# Patient Record
Sex: Female | Born: 1950
Health system: Southern US, Community
[De-identification: ages and names within clinical notes are randomized; demographics above are authoritative.]

## PROBLEM LIST (undated history)

## (undated) DIAGNOSIS — K8020 Calculus of gallbladder without cholecystitis without obstruction: Secondary | ICD-10-CM

## (undated) DIAGNOSIS — B029 Zoster without complications: Secondary | ICD-10-CM

## (undated) HISTORY — PX: CHOLECYSTECTOMY: SHX55

---

## 1998-07-16 ENCOUNTER — Other Ambulatory Visit: Admission: RE | Admit: 1998-07-16 | Discharge: 1998-07-16 | Payer: Self-pay | Admitting: Obstetrics and Gynecology

## 1999-02-21 ENCOUNTER — Encounter: Payer: Self-pay | Admitting: Emergency Medicine

## 1999-02-21 ENCOUNTER — Emergency Department (HOSPITAL_COMMUNITY): Admission: EM | Admit: 1999-02-21 | Discharge: 1999-02-21 | Payer: Self-pay | Admitting: Emergency Medicine

## 1999-02-25 ENCOUNTER — Emergency Department (HOSPITAL_COMMUNITY): Admission: EM | Admit: 1999-02-25 | Discharge: 1999-02-25 | Payer: Self-pay | Admitting: Emergency Medicine

## 1999-03-01 ENCOUNTER — Emergency Department (HOSPITAL_COMMUNITY): Admission: EM | Admit: 1999-03-01 | Discharge: 1999-03-01 | Payer: Self-pay | Admitting: Emergency Medicine

## 1999-03-01 ENCOUNTER — Encounter: Payer: Self-pay | Admitting: Emergency Medicine

## 1999-04-28 ENCOUNTER — Ambulatory Visit (HOSPITAL_COMMUNITY): Admission: RE | Admit: 1999-04-28 | Discharge: 1999-04-28 | Payer: Self-pay | Admitting: Internal Medicine

## 2000-04-10 ENCOUNTER — Ambulatory Visit (HOSPITAL_COMMUNITY): Admission: RE | Admit: 2000-04-10 | Discharge: 2000-04-10 | Payer: Self-pay | Admitting: *Deleted

## 2001-01-03 ENCOUNTER — Encounter: Payer: Self-pay | Admitting: Family Medicine

## 2001-01-03 ENCOUNTER — Ambulatory Visit (HOSPITAL_COMMUNITY): Admission: RE | Admit: 2001-01-03 | Discharge: 2001-01-03 | Payer: Self-pay | Admitting: Family Medicine

## 2001-01-14 ENCOUNTER — Ambulatory Visit (HOSPITAL_COMMUNITY): Admission: RE | Admit: 2001-01-14 | Discharge: 2001-01-14 | Payer: Self-pay | Admitting: Family Medicine

## 2001-01-18 ENCOUNTER — Encounter: Payer: Self-pay | Admitting: Emergency Medicine

## 2001-01-18 ENCOUNTER — Emergency Department (HOSPITAL_COMMUNITY): Admission: EM | Admit: 2001-01-18 | Discharge: 2001-01-18 | Payer: Self-pay | Admitting: Emergency Medicine

## 2001-02-12 ENCOUNTER — Encounter: Payer: Self-pay | Admitting: Family Medicine

## 2001-02-12 ENCOUNTER — Ambulatory Visit (HOSPITAL_COMMUNITY): Admission: RE | Admit: 2001-02-12 | Discharge: 2001-02-12 | Payer: Self-pay | Admitting: Family Medicine

## 2002-03-05 ENCOUNTER — Ambulatory Visit (HOSPITAL_COMMUNITY): Admission: RE | Admit: 2002-03-05 | Discharge: 2002-03-05 | Payer: Self-pay | Admitting: Family Medicine

## 2002-03-07 ENCOUNTER — Encounter: Admission: RE | Admit: 2002-03-07 | Discharge: 2002-03-07 | Payer: Self-pay | Admitting: Family Medicine

## 2002-03-07 ENCOUNTER — Encounter: Payer: Self-pay | Admitting: Family Medicine

## 2002-09-16 ENCOUNTER — Encounter: Admission: RE | Admit: 2002-09-16 | Discharge: 2002-09-16 | Payer: Self-pay | Admitting: Internal Medicine

## 2002-09-16 ENCOUNTER — Encounter: Payer: Self-pay | Admitting: Internal Medicine

## 2003-03-31 ENCOUNTER — Emergency Department (HOSPITAL_COMMUNITY): Admission: AD | Admit: 2003-03-31 | Discharge: 2003-04-01 | Payer: Self-pay | Admitting: Emergency Medicine

## 2003-11-16 ENCOUNTER — Ambulatory Visit: Payer: Self-pay | Admitting: *Deleted

## 2003-12-30 ENCOUNTER — Ambulatory Visit: Payer: Self-pay | Admitting: Nurse Practitioner

## 2004-01-13 ENCOUNTER — Ambulatory Visit (HOSPITAL_COMMUNITY): Admission: RE | Admit: 2004-01-13 | Discharge: 2004-01-13 | Payer: Self-pay | Admitting: Family Medicine

## 2009-11-05 ENCOUNTER — Ambulatory Visit: Payer: Self-pay | Admitting: Internal Medicine

## 2011-04-06 ENCOUNTER — Encounter (HOSPITAL_COMMUNITY): Payer: Self-pay | Admitting: *Deleted

## 2011-04-06 ENCOUNTER — Emergency Department (HOSPITAL_COMMUNITY)
Admission: EM | Admit: 2011-04-06 | Discharge: 2011-04-07 | Disposition: A | Discharge status: Home / Self Care | Payer: Self-pay | Attending: Emergency Medicine | Admitting: Emergency Medicine

## 2011-04-06 DIAGNOSIS — R109 Unspecified abdominal pain: Secondary | ICD-10-CM | POA: Insufficient documentation

## 2011-04-06 DIAGNOSIS — R21 Rash and other nonspecific skin eruption: Secondary | ICD-10-CM | POA: Insufficient documentation

## 2011-04-06 DIAGNOSIS — B029 Zoster without complications: Secondary | ICD-10-CM | POA: Insufficient documentation

## 2011-04-06 HISTORY — DX: Calculus of gallbladder without cholecystitis without obstruction: K80.20

## 2011-04-06 HISTORY — DX: Zoster without complications: B02.9

## 2011-04-06 NOTE — ED Notes (Addendum)
C/o R sided abd/back pain, also rash in same area, onset yesterday. (denies: itching, vd, fever). rash red linear blisters, some open. Some nausea. H/o shingles.

## 2011-04-07 MED ORDER — PREDNISONE 10 MG PO TABS: ORAL_TABLET | ORAL | Status: DC

## 2011-04-07 MED ORDER — HYDROCODONE-ACETAMINOPHEN 5-325 MG PO TABS: 2.0000 | ORAL_TABLET | ORAL | Status: AC | PRN

## 2011-04-07 MED ORDER — HYDROCODONE-ACETAMINOPHEN 5-325 MG PO TABS
1.0000 | ORAL_TABLET | Freq: Once | ORAL | Status: AC
  Administered 2011-04-07: 1 via ORAL
  Filled 2011-04-07: qty 1

## 2011-04-07 MED ORDER — ACYCLOVIR 400 MG PO TABS: 400.0000 mg | ORAL_TABLET | Freq: Four times a day (QID) | ORAL | Status: AC

## 2011-04-07 NOTE — ED Provider Notes (Signed)
Medical screening examination/treatment/procedure(s) were performed by non-physician practitioner and as supervising physician I was immediately available for consultation/collaboration.  Alberto Pina P Navia Lindahl, MD 04/07/11 0659 

## 2011-04-07 NOTE — ED Provider Notes (Signed)
History     CSN: [id number]  Arrival date & time 04/06/11  2226   First MD Initiated Contact with Patient 04/06/11 2353      Chief Complaint  Patient presents with  . Herpes Zoster  . Flank Pain  . Rash     HPI  History provided by the patient. Patient is a 61 year old female with past history of shingles who presents with complaints of burning pain and rash to her right back flank and abdomen. Patient reports symptoms first began with pain that felt like chin her back 4 days ago. 2 days ago she began developing a red blistering rash on the same area with increased pain into her abdomen. She reports symptoms are similar to prior history of shingles many years ago. She denies having any recent illnesses. She denies any fever, chills, sweats, nausea or vomiting. Patient has not taken any medications for her symptoms. She denies any aggravating or alleviating factors. She has no other complaints.    Past Medical History  Diagnosis Date  . Shingles   . Gallstones     Past Surgical History  Procedure Date  . Cholecystectomy     History reviewed. No pertinent family history.  History  Substance Use Topics  . Smoking status: Never Smoker   . Smokeless tobacco: Not on file  . Alcohol Use: No    OB History    Grav Para Term Preterm Abortions TAB SAB Ect Mult Living                  Review of Systems  Constitutional: Negative for fever and chills.  Respiratory: Negative for cough and shortness of breath.   Cardiovascular: Negative for chest pain.  Gastrointestinal: Negative for nausea and vomiting.  Skin: Positive for rash.  All other systems reviewed and are negative.    Allergies  Codeine and Erythromycin  Home Medications   Current Outpatient Rx  Name Route Sig Dispense Refill  . IBUPROFEN 200 MG PO TABS Oral Take 400 mg by mouth every 6 (six) hours as needed. For pain    . THERA M PLUS PO TABS Oral Take 1 tablet by mouth daily.    Marland Kitchen OVER THE COUNTER  MEDICATION Oral Take 2 tablets by mouth every morning. OTC allergy and sinus tablets      BP 176/89  Pulse 89  Temp(Src) 98.5 F (36.9 C) (Oral)  Resp 14  SpO2 96%  Physical Exam  Nursing note and vitals reviewed. Constitutional: She is oriented to person, place, and time. She appears well-developed and well-nourished. No distress.  HENT:  Head: Normocephalic and atraumatic.  Cardiovascular: Normal rate and regular rhythm.   Pulmonary/Chest: Effort normal and breath sounds normal. No respiratory distress. She has no wheezes. She has no rales.  Neurological: She is alert and oriented to person, place, and time.  Skin: Skin is warm and dry. No rash noted.       Herpetic rash with erythema limited to the dermatome on the right lower back flank and abdomen area. Rash does not cross the midline anteriorly or posteriorly.  Psychiatric: She has a normal mood and affect. Her behavior is normal.    ED Course  Procedures     1. Shingles       MDM  12:00 AM patient seen and evaluated. Patient in no acute distress.        Angus Seller, [place] 04/07/11 [phone]

## 2012-05-04 ENCOUNTER — Encounter (HOSPITAL_COMMUNITY): Payer: Self-pay | Admitting: *Deleted

## 2012-05-04 ENCOUNTER — Emergency Department (HOSPITAL_COMMUNITY): Payer: Self-pay

## 2012-05-04 ENCOUNTER — Emergency Department (HOSPITAL_COMMUNITY)
Admission: EM | Admit: 2012-05-04 | Discharge: 2012-05-05 | Disposition: A | Discharge status: Home / Self Care | Payer: Self-pay | Attending: Emergency Medicine | Admitting: Emergency Medicine

## 2012-05-04 DIAGNOSIS — Y929 Unspecified place or not applicable: Secondary | ICD-10-CM | POA: Insufficient documentation

## 2012-05-04 DIAGNOSIS — Z23 Encounter for immunization: Secondary | ICD-10-CM | POA: Insufficient documentation

## 2012-05-04 DIAGNOSIS — Y9389 Activity, other specified: Secondary | ICD-10-CM | POA: Insufficient documentation

## 2012-05-04 DIAGNOSIS — W268XXA Contact with other sharp object(s), not elsewhere classified, initial encounter: Secondary | ICD-10-CM | POA: Insufficient documentation

## 2012-05-04 DIAGNOSIS — S61409A Unspecified open wound of unspecified hand, initial encounter: Secondary | ICD-10-CM | POA: Insufficient documentation

## 2012-05-04 DIAGNOSIS — S6990XA Unspecified injury of unspecified wrist, hand and finger(s), initial encounter: Secondary | ICD-10-CM | POA: Insufficient documentation

## 2012-05-04 DIAGNOSIS — Z8719 Personal history of other diseases of the digestive system: Secondary | ICD-10-CM | POA: Insufficient documentation

## 2012-05-04 DIAGNOSIS — S61412A Laceration without foreign body of left hand, initial encounter: Secondary | ICD-10-CM

## 2012-05-04 DIAGNOSIS — Z8619 Personal history of other infectious and parasitic diseases: Secondary | ICD-10-CM | POA: Insufficient documentation

## 2012-05-04 DIAGNOSIS — S6992XA Unspecified injury of left wrist, hand and finger(s), initial encounter: Secondary | ICD-10-CM

## 2012-05-04 MED ORDER — TETANUS-DIPHTH-ACELL PERTUSSIS 5-2.5-18.5 LF-MCG/0.5 IM SUSP
0.5000 mL | Freq: Once | INTRAMUSCULAR | Status: AC
  Administered 2012-05-04: 0.5 mL via INTRAMUSCULAR
  Filled 2012-05-04: qty 0.5

## 2012-05-04 MED ORDER — OXYCODONE-ACETAMINOPHEN 5-325 MG PO TABS
2.0000 | ORAL_TABLET | Freq: Once | ORAL | Status: AC
  Administered 2012-05-04: 2 via ORAL
  Filled 2012-05-04: qty 2

## 2012-05-04 NOTE — ED Notes (Signed)
Pt states that her coffee cup broke in her hands. Pt has an approx. Inch long laceration to her skin between her thumb and first finger on her left hand. Pt bleeding controlled at this time. Pt lac dressing changed.

## 2012-05-04 NOTE — ED Notes (Signed)
Last tetnus over 10 years.

## 2012-05-05 MED ORDER — IBUPROFEN 800 MG PO TABS: 800.0000 mg | ORAL_TABLET | Freq: Three times a day (TID) | ORAL | Status: DC

## 2012-05-05 NOTE — ED Provider Notes (Signed)
History     CSN: [id number]  Arrival date & time 05/04/12  1955   First MD Initiated Contact with Patient 05/04/12 2221      Chief Complaint  Patient presents with  . Finger Injury    (Consider location/radiation/quality/duration/timing/severity/associated sxs/prior treatment) HPI Comments: Patient is a 62 year old female who presents with a laceration to her left hand that occurred prior to arrival. Patient reports carry a coffee mug and tripped. She fell and the coffee mug broke in her hand, causing a laceration. Patient reports sudden onset of pain and bleeding at the laceration site. The pain is dull and moderate without radiation. Movement and palpation makes the pain worse. Nothing makes the pain better. Patient held pressure on the wound to control bleeding. No alleviating factors.    Past Medical History  Diagnosis Date  . Shingles   . Gallstones     Past Surgical History  Procedure Laterality Date  . Cholecystectomy      History reviewed. No pertinent family history.  History  Substance Use Topics  . Smoking status: Never Smoker   . Smokeless tobacco: Not on file  . Alcohol Use: No    OB History   Grav Para Term Preterm Abortions TAB SAB Ect Mult Living                  Review of Systems  Skin: Positive for wound.  All other systems reviewed and are negative.    Allergies  Alcohol detox; Codeine; and Erythromycin  Home Medications   Current Outpatient Rx  Name  Route  Sig  Dispense  Refill  . ibuprofen (ADVIL,MOTRIN) 200 MG tablet   Oral   Take 400 mg by mouth every 6 (six) hours as needed. For pain         . Multiple Vitamins-Minerals (MULTIVITAMINS THER. W/MINERALS) TABS   Oral   Take 1 tablet by mouth daily.         Marland Kitchen OVER THE COUNTER MEDICATION   Oral   Take 2 tablets by mouth every morning. OTC allergy and sinus tablets           BP 175/89  Pulse 86  Temp(Src) 98.3 F (36.8 C) (Oral)  Resp 16  SpO2 95%  Physical Exam   Nursing note and vitals reviewed. Constitutional: She is oriented to person, place, and time. She appears well-developed and well-nourished. No distress.  HENT:  Head: Normocephalic and atraumatic.  Eyes: Conjunctivae and EOM are normal.  Neck: Normal range of motion. Neck supple.  Cardiovascular: Normal rate and regular rhythm.  Exam reveals no gallop and no friction rub.   No murmur heard. Pulmonary/Chest: Effort normal and breath sounds normal. She has no wheezes. She has no rales. She exhibits no tenderness.  Abdominal: Soft. There is no tenderness.  Musculoskeletal: Normal range of motion.  Neurological: She is alert and oriented to person, place, and time. Coordination normal.  Left hand strength limited due to pain. ROM of left hand joints intact. Speech is goal-oriented. Moves limbs without ataxia.   Skin: Skin is warm and dry.  Deep, 6cm laceration to volar aspect of left hand. Bleeding controlled at this time.     ED Course  Procedures (including critical care time)  LACERATION REPAIR Performed by: Emilia Beck Authorized by: Emilia Beck Consent: Verbal consent obtained. Risks and benefits: risks, benefits and alternatives were discussed Consent given by: patient Patient identity confirmed: provided demographic data Prepped and Draped in normal sterile fashion  Wound explored  Laceration Location: left hand  Laceration Length: 4 cm  No Foreign Bodies seen or palpated  Anesthesia: local infiltration  Local anesthetic: lidocaine 2% without epinephrine  Anesthetic total: 2 ml  Irrigation method: syringe Amount of cleaning: standard  Skin closure: 5-0 prolene  Number of sutures: 6  Technique: simple  Patient tolerance: Patient tolerated the procedure well with no immediate complications.   Labs Reviewed - No data to display Dg Hand Complete Left  05/04/2012  *RADIOLOGY REPORT*  Clinical Data: Status post fall; mug shattered in left hand.  Laceration between the thumb and index finger.  LEFT HAND - COMPLETE 3+ VIEW  Comparison: None.  Findings: There is no evidence of fracture or dislocation.  There is degenerative change at the second and fifth distal interphalangeal joints, most compatible with osteoarthritis.  The carpal rows are intact, and demonstrate normal alignment.  A soft tissue laceration is noted overlying the distal second metacarpal.  No radiopaque foreign body is seen.  A small osseous fragment noted distal to the distal ulna may reflect remote injury; the adjacent carpal row is grossly unremarkable in appearance.  IMPRESSION:  1.  No evidence of fracture or dislocation.  No radiopaque foreign bodies seen. 2.  Changes of osteoarthritis noted at the second and fifth distal interphalangeal joints.   Original Report Authenticated By: Tonia Ghent, M.D.      1. Hand injury, left, initial encounter   2. Hand laceration, left, initial encounter       MDM  1:12 AM Patient's xray negative for acute changes. Patient's laceration repaired without difficulty. Patient will be discharged without further evaluation.         Emilia Beck, PA-C 05/08/12 0725

## 2012-05-05 NOTE — ED Notes (Signed)
Pt discharged.Vital signs stable and GCS 15 

## 2012-05-08 NOTE — ED Provider Notes (Signed)
Medical screening examination/treatment/procedure(s) were performed by non-physician practitioner and as supervising physician I was immediately available for consultation/collaboration.  Olivia Mackie, MD 05/08/12 [phone]

## 2014-11-20 DIAGNOSIS — F330 Major depressive disorder, recurrent, mild: Secondary | ICD-10-CM | POA: Diagnosis not present

## 2015-02-04 DIAGNOSIS — F330 Major depressive disorder, recurrent, mild: Secondary | ICD-10-CM | POA: Diagnosis not present

## 2015-04-20 DIAGNOSIS — F330 Major depressive disorder, recurrent, mild: Secondary | ICD-10-CM | POA: Diagnosis not present

## 2015-11-17 DIAGNOSIS — F330 Major depressive disorder, recurrent, mild: Secondary | ICD-10-CM | POA: Diagnosis not present

## 2015-12-15 DIAGNOSIS — F330 Major depressive disorder, recurrent, mild: Secondary | ICD-10-CM | POA: Diagnosis not present

## 2016-04-04 DIAGNOSIS — F330 Major depressive disorder, recurrent, mild: Secondary | ICD-10-CM | POA: Diagnosis not present

## 2016-06-14 DIAGNOSIS — F419 Anxiety disorder, unspecified: Secondary | ICD-10-CM | POA: Diagnosis not present

## 2016-06-14 DIAGNOSIS — F330 Major depressive disorder, recurrent, mild: Secondary | ICD-10-CM | POA: Diagnosis not present

## 2016-09-13 DIAGNOSIS — F330 Major depressive disorder, recurrent, mild: Secondary | ICD-10-CM | POA: Diagnosis not present

## 2017-01-10 DIAGNOSIS — F330 Major depressive disorder, recurrent, mild: Secondary | ICD-10-CM | POA: Diagnosis not present

## 2018-03-11 ENCOUNTER — Ambulatory Visit (INDEPENDENT_AMBULATORY_CARE_PROVIDER_SITE_OTHER): Payer: PPO | Admitting: Family Medicine

## 2018-03-11 ENCOUNTER — Ambulatory Visit: Payer: Self-pay | Admitting: Family Medicine

## 2018-03-11 ENCOUNTER — Encounter: Payer: Self-pay | Admitting: Family Medicine

## 2018-03-11 VITALS — BP 126/78 | HR 81 | Temp 98.2°F | Ht 62.5 in | Wt 156.2 lb

## 2018-03-11 DIAGNOSIS — F419 Anxiety disorder, unspecified: Secondary | ICD-10-CM | POA: Insufficient documentation

## 2018-03-11 DIAGNOSIS — F325 Major depressive disorder, single episode, in full remission: Secondary | ICD-10-CM

## 2018-03-11 DIAGNOSIS — F339 Major depressive disorder, recurrent, unspecified: Secondary | ICD-10-CM | POA: Insufficient documentation

## 2018-03-11 DIAGNOSIS — B372 Candidiasis of skin and nail: Secondary | ICD-10-CM

## 2018-03-11 DIAGNOSIS — D179 Benign lipomatous neoplasm, unspecified: Secondary | ICD-10-CM | POA: Diagnosis not present

## 2018-03-11 MED ORDER — HYDROXYZINE HCL 25 MG PO TABS: 25.0000 mg | ORAL_TABLET | Freq: Three times a day (TID) | ORAL | 5 refills | Status: DC

## 2018-03-11 MED ORDER — TRIAMCINOLONE ACETONIDE 0.5 % EX OINT: 1.0000 "application " | TOPICAL_OINTMENT | Freq: Two times a day (BID) | CUTANEOUS | 0 refills | Status: DC

## 2018-03-11 MED ORDER — CLONAZEPAM 0.5 MG PO TABS: 0.5000 mg | ORAL_TABLET | Freq: Every day | ORAL | 0 refills | Status: DC

## 2018-03-11 MED ORDER — NYSTATIN 100000 UNIT/GM EX OINT: 1.0000 "application " | TOPICAL_OINTMENT | Freq: Two times a day (BID) | CUTANEOUS | 0 refills | Status: DC

## 2018-03-11 MED ORDER — FLUOXETINE HCL 20 MG PO CAPS: 20.0000 mg | ORAL_CAPSULE | Freq: Every day | ORAL | 11 refills | Status: DC

## 2018-03-11 MED ORDER — GABAPENTIN 300 MG PO CAPS: 300.0000 mg | ORAL_CAPSULE | Freq: Three times a day (TID) | ORAL | 5 refills | Status: DC

## 2018-03-11 NOTE — Assessment & Plan Note (Signed)
Continue Prozac 20 mg daily, hydroxyzine 25 mg 3 times daily, gabapentin 300 mg 3 times daily, and Klonopin 0.5 mg at night.  Controlled substance contract reviewed, signed, and scanned into chart.  Database reviewed without red flags.  PHQ score of 12 and GAD score of 9 today.  Patient states that medication helps with her ability to function.

## 2018-03-11 NOTE — Assessment & Plan Note (Signed)
Reassured patient.  Continue with watchful waiting.

## 2018-03-11 NOTE — Assessment & Plan Note (Signed)
Stable.  See depression A/P.  Continue Prozac 20 mg daily, gabapentin 300 mg 3 times daily, hydroxyzine 25 mg 3 times daily, and Klonopin 0.5 mg nightly.

## 2018-03-11 NOTE — Patient Instructions (Signed)
It was very nice to see you today!  Please keep an eye on the spot in your back.  Let me know if it worsens or changes in any way.  Please try the nystatin and triamcinolone ointments.  Do this twice a day for the next week.  Let me know if your symptoms do not improve.  We will refill the rest of your medications.  Please come back soon for your physical with blood work.  Take care, Dr Jerline Pain

## 2018-03-11 NOTE — Progress Notes (Signed)
Subjective:  Kristy Trujillo is a 68 y.o. female who presents today with a chief complaint of skin lesion and to establish care.   HPI:  Skin Lump Started about a year ago. Located on right upper back.  Has been stable in size over the last year.  No pain.  No drainage.  No treatments tried.  No precipitating events.  Rash Started a couple of months ago.  Located in the umbilicus and in belt line bilaterally.  She has tried probiotics with no improvement.  Rash is very itchy and scaly.  No bleeding.  No drainage.  Location become raw and irritated.  Her chronic, stable medical conditions are outlined below:  # Depression / Anxiety -Currently on Prozac 20 mg daily, gabapentin 300 mg 3 times daily, hydroxyzine 25 mg 3 times daily, and Klonopin 0.5 mg daily.  Depression screen PHQ 2/9 03/11/2018  Decreased Interest 1  Down, Depressed, Hopeless 1  PHQ - 2 Score 2  Altered sleeping 3  Tired, decreased energy 3  Change in appetite 1  Feeling bad or failure about yourself  1  Trouble concentrating 1  Moving slowly or fidgety/restless 1  Suicidal thoughts 0  PHQ-9 Score 12  Difficult doing work/chores Not difficult at all    GAD 7 : Generalized Anxiety Score 03/11/2018  Nervous, Anxious, on Edge 2  Control/stop worrying 1  Worry too much - different things 1  Trouble relaxing 2  Restless 2  Easily annoyed or irritable 1  Afraid - awful might happen 0  Total GAD 7 Score 9  Anxiety Difficulty Not difficult at all   ROS: Per HPI, otherwise a complete review of systems was negative.   PMH:  The following were reviewed and entered/updated in epic: Past Medical History:  Diagnosis Date  . Gallstones   . Shingles    Patient Active Problem List   Diagnosis Date Noted  . Lipoma 03/11/2018  . Depression, major, in remission ([place]) 03/11/2018   Past Surgical History:  Procedure Laterality Date  . CHOLECYSTECTOMY      Family History  Problem Relation Age of Onset  .  Diabetes Neg Hx   . High Cholesterol Neg Hx   . Hypertension Neg Hx   . Colon cancer Neg Hx   . Breast cancer Neg Hx     Medications- reviewed and updated Current Outpatient Medications  Medication Sig Dispense Refill  . clonazePAM (KLONOPIN) 0.5 MG tablet Take 1 tablet (0.5 mg total) by mouth at bedtime. 30 tablet 0  . FLUoxetine (PROZAC) 20 MG capsule Take 1 capsule (20 mg total) by mouth daily. 30 capsule 11  . gabapentin (NEURONTIN) 300 MG capsule Take 1 capsule (300 mg total) by mouth 3 (three) times daily. 90 capsule 5  . hydrOXYzine (ATARAX/VISTARIL) 25 MG tablet Take 1 tablet (25 mg total) by mouth 3 (three) times daily. 90 tablet 5  . ibuprofen (ADVIL,MOTRIN) 200 MG tablet Take 400 mg by mouth every 6 (six) hours as needed. For pain    . Multiple Vitamins-Minerals (MULTIVITAMINS THER. W/MINERALS) TABS Take 1 tablet by mouth daily.    Marland Kitchen OVER THE COUNTER MEDICATION Take 2 tablets by mouth every morning. OTC allergy and sinus tablets    . nystatin ointment (MYCOSTATIN) Apply 1 application topically 2 (two) times daily. 30 g 0  . triamcinolone ointment (KENALOG) 0.5 % Apply 1 application topically 2 (two) times daily. 30 g 0   No current facility-administered medications for this visit.  Allergies-reviewed and updated Allergies  Allergen Reactions  . Alcohol Detox [Nutritional Supplements]     Flushing (cough medicine)  . Codeine Swelling  . Erythromycin Swelling    Social History   Socioeconomic History  . Marital status: Divorced    Spouse name: Not on file  . Number of children: Not on file  . Years of education: Not on file  . Highest education level: Not on file  Occupational History  . Not on file  Social Needs  . Financial resource strain: Not on file  . Food insecurity:    Worry: Not on file    Inability: Not on file  . Transportation needs:    Medical: Not on file    Non-medical: Not on file  Tobacco Use  . Smoking status: Never Smoker  .  Smokeless tobacco: Never Used  Substance and Sexual Activity  . Alcohol use: No  . Drug use: No  . Sexual activity: Not on file  Lifestyle  . Physical activity:    Days per week: Not on file    Minutes per session: Not on file  . Stress: Not on file  Relationships  . Social connections:    Talks on phone: Not on file    Gets together: Not on file    Attends religious service: Not on file    Active member of club or organization: Not on file    Attends meetings of clubs or organizations: Not on file    Relationship status: Not on file  Other Topics Concern  . Not on file  Social History Narrative  . Not on file     Objective:  Physical Exam: BP 126/78 (BP Location: Left Arm, Patient Position: Sitting, Cuff Size: Normal)   Pulse 81   Temp 98.2 F (36.8 C) (Oral)   Ht 5' 2.5" (1.588 m)   Wt 156 lb 3.2 oz (70.9 kg)   SpO2 98%   BMI 28.11 kg/m   Gen: NAD, resting comfortably CV: RRR with no murmurs appreciated Pulm: NWOB, CTAB with no crackles, wheezes, or rhonchi GI: Normal bowel sounds present. Soft, Nontender, Nondistended. MSK: No edema, cyanosis, or clubbing noted Skin:  -Back: Approximately 2 to 3 cm freely mobile mass in subcutaneous tissue on right upper shoulder.  Erythematous, flaky rash in umbilicus and in inguinal area bilaterally. Neuro: Grossly normal, moves all extremities Psych: Normal affect and thought content  Assessment/Plan:  Lipoma Reassured patient.  Continue with watchful waiting.  Depression, major, in remission ([place]) Continue Prozac 20 mg daily, hydroxyzine 25 mg 3 times daily, gabapentin 300 mg 3 times daily, and Klonopin 0.5 mg at night.  Controlled substance contract reviewed, signed, and scanned into chart.  Database reviewed without red flags.  PHQ score of 12 and GAD score of 9 today.  Patient states that medication helps with her ability to function.  Candidal intertrigo Start topical nystatin and triamcinolone.  Preventative  Healthcare Patient was instructed to return soon for CPE.  Will obtain records from previous PCP.  Health Maintenance Due  Topic Date Due  . Hepatitis C Screening  [birth year]  . MAMMOGRAM  07/29/1999  . COLONOSCOPY  07/29/1999  . DEXA SCAN  07/29/2014   Caleb M. Jerline Pain, MD 03/11/2018 4:59 PM

## 2018-04-19 ENCOUNTER — Other Ambulatory Visit: Payer: Self-pay | Admitting: Family Medicine

## 2018-04-19 NOTE — Telephone Encounter (Signed)
Please advise 

## 2018-05-21 ENCOUNTER — Other Ambulatory Visit: Payer: Self-pay | Admitting: Family Medicine

## 2018-05-21 NOTE — Telephone Encounter (Signed)
Please advise 

## 2018-07-03 ENCOUNTER — Other Ambulatory Visit: Payer: Self-pay | Admitting: Family Medicine

## 2018-07-03 NOTE — Telephone Encounter (Signed)
Requested patient schedule appt.

## 2018-07-04 NOTE — Telephone Encounter (Signed)
Requesting Rx

## 2018-08-26 ENCOUNTER — Other Ambulatory Visit: Payer: Self-pay | Admitting: Family Medicine

## 2018-08-27 ENCOUNTER — Ambulatory Visit (INDEPENDENT_AMBULATORY_CARE_PROVIDER_SITE_OTHER): Payer: PPO | Admitting: Family Medicine

## 2018-08-27 ENCOUNTER — Other Ambulatory Visit: Payer: Self-pay

## 2018-08-27 ENCOUNTER — Encounter: Payer: Self-pay | Admitting: Family Medicine

## 2018-08-27 ENCOUNTER — Telehealth: Payer: Self-pay | Admitting: Family Medicine

## 2018-08-27 DIAGNOSIS — F419 Anxiety disorder, unspecified: Secondary | ICD-10-CM

## 2018-08-27 DIAGNOSIS — B372 Candidiasis of skin and nail: Secondary | ICD-10-CM

## 2018-08-27 DIAGNOSIS — N898 Other specified noninflammatory disorders of vagina: Secondary | ICD-10-CM | POA: Diagnosis not present

## 2018-08-27 DIAGNOSIS — F325 Major depressive disorder, single episode, in full remission: Secondary | ICD-10-CM

## 2018-08-27 MED ORDER — CLONAZEPAM 0.5 MG PO TABS: 0.5000 mg | ORAL_TABLET | Freq: Every day | ORAL | 0 refills | Status: DC

## 2018-08-27 MED ORDER — ITRACONAZOLE 200 MG PO TABS: 200.0000 mg | ORAL_TABLET | Freq: Every day | ORAL | 0 refills | Status: DC

## 2018-08-27 MED ORDER — FLUCONAZOLE 150 MG PO TABS: ORAL_TABLET | ORAL | 0 refills | Status: DC

## 2018-08-27 NOTE — Telephone Encounter (Signed)
Rx sent to pharmacy   

## 2018-08-27 NOTE — Telephone Encounter (Signed)
Please advise 

## 2018-08-27 NOTE — Progress Notes (Signed)
    Chief Complaint:  Kristy Trujillo is a 68 y.o. female who presents for a telephone visit with a chief complaint of candidal intertrigo.   Assessment/Plan:  Candidal intertrigo Patient has failed topical therapy.  Will start itraconazole 200 mg daily for 7 to 14 days.  Discussed reasons to return to care.  Vaginal discharge Patient is known to come in for swab today.  Will hopefully have some treatment with above she thinks this is most likely due to yeast infection.  Discussed reasons to return to care.  Anxiety Stable.  Continue Prozac 20 mg daily, gabapentin 300 mg 3 times daily, hydroxyzine 25 mg 3 times daily, and Klonopin 0.5 mg nightly.  Refill for Klonopin sent in today.  Depression, major, in remission ([place]) Stable.  Continue Prozac 20 mg daily, hydroxyzine 25 mg 3 times daily, gabapentin 300 mg 3 times daily, and Klonopin 0.5 mg nightly.     Subjective:  HPI:  Candidal intertrigo Patient was seen about 5 months ago for this.  Started on triamcinolone and nystatin.  Symptoms have not improved.  Worsened recently.  No other treatments tried.  Area is very itchy.  Vaginal discharge Symptoms started several days ago.  Similar characteristic to yeast infection.  No specific treatments tried.  No reported abdominal pain. No fevers or chills.  Her stable, chronic medical conditions are outlined below:  # Depression / Anxiety -Currently on Prozac 20 mg daily, gabapentin 300 mg 3 times daily, hydroxyzine 25 mg 3 times daily, and Klonopin 0.5 mg nightly  ROS: Per HPI  PMH: She reports that she has never smoked. She has never used smokeless tobacco. She reports that she does not drink alcohol or use drugs.      Objective/Observations   NAD, speaking in full sentences.   Telephone Visit   I connected with Kristy Trujillo on 08/27/18 at 11:20 AM EDT via telephone and verified that I am speaking with the correct person using two identifiers. I discussed the limitations of  evaluation and management by telemedicine and the availability of in person appointments. The patient expressed understanding and agreed to proceed.   Patient location: Home Provider location: [place] participating in the virtual visit: Myself and Patient      Algis Greenhouse. Jerline Pain, MD 08/27/2018 10:28 AM

## 2018-08-27 NOTE — Assessment & Plan Note (Signed)
Stable.  Continue Prozac 20 mg daily, hydroxyzine 25 mg 3 times daily, gabapentin 300 mg 3 times daily, and Klonopin 0.5 mg nightly.

## 2018-08-27 NOTE — Assessment & Plan Note (Signed)
Stable.  Continue Prozac 20 mg daily, gabapentin 300 mg 3 times daily, hydroxyzine 25 mg 3 times daily, and Klonopin 0.5 mg nightly.  Refill for Klonopin sent in today.

## 2018-08-27 NOTE — Telephone Encounter (Signed)
Ok to send in dicflucan 150mg  once weekly for 4 weeks.  Algis Greenhouse. Jerline Pain, MD 08/27/2018 3:19 PM

## 2018-08-27 NOTE — Telephone Encounter (Signed)
Patient called stating the medication(Itraconzole) pcp prescribed her today was $400 and insurance denied it. Patient is requesting something else be sent to pharmacy Patient call back [phone]  [place], [place] - [address] [phone] (Phone) [phone] (Fax)

## 2018-09-23 ENCOUNTER — Other Ambulatory Visit: Payer: Self-pay | Admitting: Family Medicine

## 2018-09-24 NOTE — Telephone Encounter (Signed)
Last OV:  08/27/2018 Next OV:  Not yet scheduled. Last fill:  08/27/2018  Please advise.

## 2018-09-26 ENCOUNTER — Other Ambulatory Visit: Payer: Self-pay

## 2018-12-23 ENCOUNTER — Ambulatory Visit: Payer: PPO

## 2018-12-23 ENCOUNTER — Encounter: Payer: Self-pay | Admitting: Family Medicine

## 2018-12-23 ENCOUNTER — Other Ambulatory Visit: Payer: Self-pay

## 2018-12-23 ENCOUNTER — Ambulatory Visit (INDEPENDENT_AMBULATORY_CARE_PROVIDER_SITE_OTHER): Payer: PPO

## 2018-12-23 DIAGNOSIS — Z23 Encounter for immunization: Secondary | ICD-10-CM | POA: Diagnosis not present

## 2019-01-09 ENCOUNTER — Telehealth: Payer: Self-pay | Admitting: Family Medicine

## 2019-01-09 NOTE — Telephone Encounter (Signed)
I called the patient to schedule AWV with Loma Sousa, but there was no answer and no option to leave a message. If patient calls back, please schedule Medicare Wellness Visit at next available opening.  VDM (Dee-Dee)

## 2019-01-17 ENCOUNTER — Other Ambulatory Visit: Payer: Self-pay | Admitting: Family Medicine

## 2019-01-17 NOTE — Telephone Encounter (Signed)
Rx Request 

## 2019-01-20 NOTE — Telephone Encounter (Signed)
Rx sent in. Please let pt know she will need OV for next refill of clonazepam.

## 2019-01-21 ENCOUNTER — Telehealth: Payer: Self-pay | Admitting: Family Medicine

## 2019-01-21 NOTE — Telephone Encounter (Signed)
Pt returned call - she will call us back after Thanksgiving to schedule a virtual appt for a medication refill.

## 2019-03-15 ENCOUNTER — Encounter: Payer: Self-pay | Admitting: Family Medicine

## 2019-03-16 ENCOUNTER — Other Ambulatory Visit: Payer: Self-pay | Admitting: Family Medicine

## 2019-03-21 ENCOUNTER — Other Ambulatory Visit: Payer: Self-pay | Admitting: Family Medicine

## 2019-03-24 ENCOUNTER — Telehealth: Payer: Self-pay | Admitting: Family Medicine

## 2019-03-24 NOTE — Telephone Encounter (Signed)
**  Remind patient they can make refill requests via MyChart**  Medication refill request (Name & Dosage):  Fluoxetine 20mg   Preferred pharmacy (Name & Address):  [facility] in [place]    Other comments (if applicable):   She picked up 3 prescriptions and noticed that this was not called in for her. She would like a 3 month prescription ( she said that is what Dr Jerline Pain usually does for her )

## 2019-03-24 NOTE — Telephone Encounter (Signed)
Medication was sent to the pharmacy. Not able to leave voice message voicemail full.

## 2019-04-02 ENCOUNTER — Telehealth: Payer: Self-pay | Admitting: Family Medicine

## 2019-04-02 NOTE — Telephone Encounter (Signed)
I called the patient to schedule AWV with Loma Sousa ([place]), but there was no answer and no option to leave a message. If patient calls back, please schedule Medicare Wellness Visit (initial) at next available opening. VDM (Dee-Dee)

## 2019-04-10 ENCOUNTER — Ambulatory Visit: Payer: Medicare (Managed Care)

## 2019-05-20 ENCOUNTER — Telehealth: Payer: Self-pay | Admitting: Family Medicine

## 2019-05-20 NOTE — Telephone Encounter (Signed)
Called pt. No answer/VM full. Pt due to schedule Medicare Annual Wellness Visit (AWV) either virtually/audio only OR in office. Whatever the patients preference is.  No hx; please schedule at anytime with [facility]Nurse Health Advisor at [facility].

## 2019-06-12 ENCOUNTER — Encounter: Payer: Self-pay | Admitting: Family Medicine

## 2019-06-12 ENCOUNTER — Ambulatory Visit (INDEPENDENT_AMBULATORY_CARE_PROVIDER_SITE_OTHER): Payer: Medicare (Managed Care)

## 2019-06-12 ENCOUNTER — Other Ambulatory Visit: Payer: Self-pay

## 2019-06-12 ENCOUNTER — Ambulatory Visit: Payer: Medicare (Managed Care)

## 2019-06-12 ENCOUNTER — Ambulatory Visit (INDEPENDENT_AMBULATORY_CARE_PROVIDER_SITE_OTHER): Payer: Medicare (Managed Care) | Admitting: Family Medicine

## 2019-06-12 VITALS — BP 132/80 | HR 91 | Temp 97.0°F | Ht 62.25 in | Wt 155.2 lb

## 2019-06-12 VITALS — BP 132/80 | HR 97 | Temp 97.0°F | Ht 62.25 in | Wt 155.2 lb

## 2019-06-12 DIAGNOSIS — F325 Major depressive disorder, single episode, in full remission: Secondary | ICD-10-CM | POA: Diagnosis not present

## 2019-06-12 DIAGNOSIS — B372 Candidiasis of skin and nail: Secondary | ICD-10-CM

## 2019-06-12 DIAGNOSIS — Z0000 Encounter for general adult medical examination without abnormal findings: Secondary | ICD-10-CM

## 2019-06-12 DIAGNOSIS — F419 Anxiety disorder, unspecified: Secondary | ICD-10-CM

## 2019-06-12 DIAGNOSIS — B029 Zoster without complications: Secondary | ICD-10-CM

## 2019-06-12 MED ORDER — GABAPENTIN 300 MG PO CAPS: 300.0000 mg | ORAL_CAPSULE | Freq: Three times a day (TID) | ORAL | 0 refills | Status: DC

## 2019-06-12 MED ORDER — CLONAZEPAM 0.5 MG PO TABS: 0.5000 mg | ORAL_TABLET | Freq: Every day | ORAL | 0 refills | Status: DC

## 2019-06-12 MED ORDER — VALACYCLOVIR HCL 1 G PO TABS
1000.0000 mg | ORAL_TABLET | Freq: Three times a day (TID) | ORAL | 0 refills | Status: DC
Start: 2019-06-12 — End: 2022-08-21

## 2019-06-12 MED ORDER — FLUCONAZOLE 150 MG PO TABS: ORAL_TABLET | ORAL | 0 refills | Status: DC

## 2019-06-12 MED ORDER — FLUOXETINE HCL 20 MG PO CAPS: 20.0000 mg | ORAL_CAPSULE | Freq: Every day | ORAL | 3 refills | Status: DC

## 2019-06-12 MED ORDER — HYDROXYZINE HCL 25 MG PO TABS: 25.0000 mg | ORAL_TABLET | Freq: Three times a day (TID) | ORAL | 3 refills | Status: DC

## 2019-06-12 NOTE — Patient Instructions (Signed)
Kristy Trujillo , Thank you for taking time to come for your Medicare Wellness Visit. I appreciate your ongoing commitment to your health goals. Please review the following plan we discussed and let me know if I can assist you in the future.   Screening recommendations/referrals: Colorectal Screening: recommended  Mammogram: recommended  Bone Density: recommended   Vision and Dental Exams: Recommended annual ophthalmology exams for early detection of glaucoma and other disorders of the eye Recommended annual dental exams for proper oral hygiene  Vaccinations: Influenza vaccine: completed 12/22/12 Pneumococcal vaccine: recommended  Tdap vaccine: up to date; last 05/04/12 Shingles vaccine:  You may receive this vaccine at your local pharmacy. (see handout)  Covid vaccine: Completed   Advanced directives: Advance directives discussed with you today. I have provided a copy for you to complete at home and have notarized. Once this is complete please bring a copy in to our office so we can scan it into your chart.  Goals: Recommend to drink at least 6-8 8oz glasses of water per day and consume a balanced diet rich in fresh fruits and vegetables.   Next appointment: Please schedule your Annual Wellness Visit with your Nurse Health Advisor in one year.  Preventive Care 70 Years and Older, Female Preventive care refers to lifestyle choices and visits with your health care provider that can promote health and wellness. What does preventive care include?  A yearly physical exam. This is also called an annual well check.  Dental exams once or twice a year.  Routine eye exams. Ask your health care provider how often you should have your eyes checked.  Personal lifestyle choices, including:  Daily care of your teeth and gums.  Regular physical activity.  Eating a healthy diet.  Avoiding tobacco and drug use.  Limiting alcohol use.  Practicing safe sex.  Taking low-dose aspirin every day  if recommended by your health care provider.  Taking vitamin and mineral supplements as recommended by your health care provider. What happens during an annual well check? The services and screenings done by your health care provider during your annual well check will depend on your age, overall health, lifestyle risk factors, and family history of disease. Counseling  Your health care provider may ask you questions about your:  Alcohol use.  Tobacco use.  Drug use.  Emotional well-being.  Home and relationship well-being.  Sexual activity.  Eating habits.  History of falls.  Memory and ability to understand (cognition).  Work and work Statistician.  Reproductive health. Screening  You may have the following tests or measurements:  Height, weight, and BMI.  Blood pressure.  Lipid and cholesterol levels. These may be checked every 5 years, or more frequently if you are over 32 years old.  Skin check.  Lung cancer screening. You may have this screening every year starting at age 54 if you have a 30-pack-year history of smoking and currently smoke or have quit within the past 15 years.  Fecal occult blood test (FOBT) of the stool. You may have this test every year starting at age 16.  Flexible sigmoidoscopy or colonoscopy. You may have a sigmoidoscopy every 5 years or a colonoscopy every 10 years starting at age 62.  Hepatitis C blood test.  Hepatitis B blood test.  Sexually transmitted disease (STD) testing.  Diabetes screening. This is done by checking your blood sugar (glucose) after you have not eaten for a while (fasting). You may have this done every 1-3 years.  Bone density  scan. This is done to screen for osteoporosis. You may have this done starting at age 52.  Mammogram. This may be done every 1-2 years. Talk to your health care provider about how often you should have regular mammograms. Talk with your health care provider about your test results,  treatment options, and if necessary, the need for more tests. Vaccines  Your health care provider may recommend certain vaccines, such as:  Influenza vaccine. This is recommended every year.  Tetanus, diphtheria, and acellular pertussis (Tdap, Td) vaccine. You may need a Td booster every 10 years.  Zoster vaccine. You may need this after age 91.  Pneumococcal 13-valent conjugate (PCV13) vaccine. One dose is recommended after age 35.  Pneumococcal polysaccharide (PPSV23) vaccine. One dose is recommended after age 60. Talk to your health care provider about which screenings and vaccines you need and how often you need them. This information is not intended to replace advice given to you by your health care provider. Make sure you discuss any questions you have with your health care provider. Document Released: 03/12/2015 Document Revised: 11/03/2015 Document Reviewed: 12/15/2014 Elsevier Interactive Patient Education  2017 [place] Prevention in the Home Falls can cause injuries. They can happen to people of all ages. There are many things you can do to make your home safe and to help prevent falls. What can I do on the outside of my home?  Regularly fix the edges of walkways and driveways and fix any cracks.  Remove anything that might make you trip as you walk through a door, such as a raised step or threshold.  Trim any bushes or trees on the path to your home.  Use bright outdoor lighting.  Clear any walking paths of anything that might make someone trip, such as rocks or tools.  Regularly check to see if handrails are loose or broken. Make sure that both sides of any steps have handrails.  Any raised decks and porches should have guardrails on the edges.  Have any leaves, snow, or ice cleared regularly.  Use sand or salt on walking paths during winter.  Clean up any spills in your garage right away. This includes oil or grease spills. What can I do in the  bathroom?  Use night lights.  Install grab bars by the toilet and in the tub and shower. Do not use towel bars as grab bars.  Use non-skid mats or decals in the tub or shower.  If you need to sit down in the shower, use a plastic, non-slip stool.  Keep the floor dry. Clean up any water that spills on the floor as soon as it happens.  Remove soap buildup in the tub or shower regularly.  Attach bath mats securely with double-sided non-slip rug tape.  Do not have throw rugs and other things on the floor that can make you trip. What can I do in the bedroom?  Use night lights.  Make sure that you have a light by your bed that is easy to reach.  Do not use any sheets or blankets that are too big for your bed. They should not hang down onto the floor.  Have a firm chair that has side arms. You can use this for support while you get dressed.  Do not have throw rugs and other things on the floor that can make you trip. What can I do in the kitchen?  Clean up any spills right away.  Avoid walking on wet  floors.  Keep items that you use a lot in easy-to-reach places.  If you need to reach something above you, use a strong step stool that has a grab bar.  Keep electrical cords out of the way.  Do not use floor polish or wax that makes floors slippery. If you must use wax, use non-skid floor wax.  Do not have throw rugs and other things on the floor that can make you trip. What can I do with my stairs?  Do not leave any items on the stairs.  Make sure that there are handrails on both sides of the stairs and use them. Fix handrails that are broken or loose. Make sure that handrails are as long as the stairways.  Check any carpeting to make sure that it is firmly attached to the stairs. Fix any carpet that is loose or worn.  Avoid having throw rugs at the top or bottom of the stairs. If you do have throw rugs, attach them to the floor with carpet tape.  Make sure that you have a  light switch at the top of the stairs and the bottom of the stairs. If you do not have them, ask someone to add them for you. What else can I do to help prevent falls?  Wear shoes that:  Do not have high heels.  Have rubber bottoms.  Are comfortable and fit you well.  Are closed at the toe. Do not wear sandals.  If you use a stepladder:  Make sure that it is fully opened. Do not climb a closed stepladder.  Make sure that both sides of the stepladder are locked into place.  Ask someone to hold it for you, if possible.  Clearly mark and make sure that you can see:  Any grab bars or handrails.  First and last steps.  Where the edge of each step is.  Use tools that help you move around (mobility aids) if they are needed. These include:  Canes.  Walkers.  Scooters.  Crutches.  Turn on the lights when you go into a dark area. Replace any light bulbs as soon as they burn out.  Set up your furniture so you have a clear path. Avoid moving your furniture around.  If any of your floors are uneven, fix them.  If there are any pets around you, be aware of where they are.  Review your medicines with your doctor. Some medicines can make you feel dizzy. This can increase your chance of falling. Ask your doctor what other things that you can do to help prevent falls. This information is not intended to replace advice given to you by your health care provider. Make sure you discuss any questions you have with your health care provider. Document Released: 12/10/2008 Document Revised: 07/22/2015 Document Reviewed: 03/20/2014 Elsevier Interactive Patient Education  2017 [employer].

## 2019-06-12 NOTE — Assessment & Plan Note (Signed)
Stable.  Will refill Prozac 20 mg daily, gabapentin 300 mg 3 times daily, hydroxyzine 25 mg 3 times daily, and Klonopin 0.5 mg nightly as needed.

## 2019-06-12 NOTE — Progress Notes (Signed)
   Kristy Trujillo is a 69 y.o. female who presents today for an office visit.  Assessment/Plan:  New/Acute Problems: Shingles No red flags.  Will start course of Valtrex.  She is already on gabapentin.  Discussed typical course of illness and reasons return to care.  Candidal intertrigo We will refill Diflucan for 4-week course.  Chronic Problems Addressed Today: Anxiety Stable.  Will refill Prozac 20 mg daily, gabapentin 300 mg 3 times daily, hydroxyzine 25 mg 3 times daily, and Klonopin 0.5 mg nightly as needed.   Depression, major, in remission ([place]) Stable.  Will refill Prozac 20 mg daily.     Subjective:  HPI:  Patient here for rash on left torso.  Started several days ago.  Has associated burning, stinging, and itching.  Feels similar to past episode of shingles.  No specific treatments tried aside from powder spray.  Symptoms are overall stable.  She has also had recurrence of umbilicus candidal intertrigo.  This was treated about a year ago with ketoconazole and a course of oral Diflucan.  She is otherwise doing well with rest of her medications.       Objective:  Physical Exam: BP 132/80   Pulse 91   Temp (!) 97 F (36.1 C)   Ht 5' 2.25" (1.581 m)   Wt 155 lb 3.2 oz (70.4 kg)   SpO2 99%   BMI 28.16 kg/m   Gen: No acute distress, resting comfortably CV: Regular rate and rhythm with no murmurs appreciated Pulm: Normal work of breathing, clear to auscultation bilaterally with no crackles, wheezes, or rhonchi Skin: Erythematous rash with overlying vesicles on left torso.  Umbilicus with erythema. Neuro: Grossly normal, moves all extremities Psych: Normal affect and thought content      Bell Cai M. Jerline Pain, MD 06/12/2019 9:56 AM

## 2019-06-12 NOTE — Progress Notes (Signed)
Subjective:   Kristy Trujillo is a 69 y.o. female who presents for an Initial Medicare Annual Wellness Visit.  Review of Systems     Cardiac Risk Factors include: advanced age (>27men, >11 women)    Objective:    Today's Vitals   06/12/19 1019  BP: 132/80  Pulse: 97  Temp: (!) 97 F (36.1 C)  TempSrc: Temporal  SpO2: 99%  Weight: 155 lb 3.3 oz (70.4 kg)  Height: 5' 2.25" (1.581 m)   Body mass index is 28.16 kg/m.  Advanced Directives 06/12/2019  Does Patient Have a Medical Advance Directive? No  Would patient like information on creating a medical advance directive? Yes (MAU/Ambulatory/Procedural Areas - Information given)    Current Medications (verified) Outpatient Encounter Medications as of 06/12/2019  Medication Sig  . clonazePAM (KLONOPIN) 0.5 MG tablet Take 1 tablet (0.5 mg total) by mouth at bedtime.  . fluconazole (DIFLUCAN) 150 MG tablet Take 1 tablet once weekly for 4 weeks  . FLUoxetine (PROZAC) 20 MG capsule Take 1 capsule (20 mg total) by mouth daily.  Marland Kitchen gabapentin (NEURONTIN) 300 MG capsule Take 1 capsule (300 mg total) by mouth 3 (three) times daily.  . hydrOXYzine (ATARAX/VISTARIL) 25 MG tablet Take 1 tablet (25 mg total) by mouth 3 (three) times daily.  Marland Kitchen ibuprofen (ADVIL,MOTRIN) 200 MG tablet Take 400 mg by mouth every 6 (six) hours as needed. For pain  . Multiple Vitamins-Minerals (MULTIVITAMINS THER. W/MINERALS) TABS Take 1 tablet by mouth daily.  Marland Kitchen OVER THE COUNTER MEDICATION Take 2 tablets by mouth every morning. OTC allergy and sinus tablets  . triamcinolone ointment (KENALOG) 0.5 % Apply 1 application topically 2 (two) times daily.  . valACYclovir (VALTREX) 1000 MG tablet Take 1 tablet (1,000 mg total) by mouth 3 (three) times daily.   No facility-administered encounter medications on file as of 06/12/2019.    Allergies (verified) Alcohol detox [nutritional supplements], Codeine, and Erythromycin   History: Past Medical History:  Diagnosis  Date  . Gallstones   . Shingles    Past Surgical History:  Procedure Laterality Date  . CHOLECYSTECTOMY     Family History  Problem Relation Age of Onset  . Diabetes Neg Hx   . High Cholesterol Neg Hx   . Hypertension Neg Hx   . Colon cancer Neg Hx   . Breast cancer Neg Hx    Social History   Socioeconomic History  . Marital status: Divorced    Spouse name: Not on file  . Number of children: Not on file  . Years of education: Not on file  . Highest education level: Not on file  Occupational History  . Occupation: Retired     Comment: Financial controller  . Smoking status: Never Smoker  . Smokeless tobacco: Never Used  Substance and Sexual Activity  . Alcohol use: No  . Drug use: No  . Sexual activity: Not on file  Other Topics Concern  . Not on file  Social History Narrative  . Not on file   Social Determinants of Health   Financial Resource Strain:   . Difficulty of Paying Living Expenses:   Food Insecurity:   . Worried About Charity fundraiser in the Last Year:   . Arboriculturist in the Last Year:   Transportation Needs:   . Film/video editor (Medical):   Marland Kitchen Lack of Transportation (Non-Medical):   Physical Activity:   . Days of Exercise per Week:   .  Minutes of Exercise per Session:   Stress:   . Feeling of Stress :   Social Connections:   . Frequency of Communication with Friends and Family:   . Frequency of Social Gatherings with Friends and Family:   . Attends Religious Services:   . Active Member of Clubs or Organizations:   . Attends Archivist Meetings:   Marland Kitchen Marital Status:     Tobacco Counseling Counseling given: Not Answered   Clinical Intake:  Pre-visit preparation completed: Yes  Pain : No/denies pain  Diabetes: No  How often do you need to have someone help you when you read instructions, pamphlets, or other written materials from your doctor or pharmacy?: 1 - Never  Interpreter Needed?: No  Information  entered by :: Kristy Trujillo   Activities of Daily Living In your present state of health, do you have any difficulty performing the following activities: 06/12/2019  Hearing? N  Vision? N  Difficulty concentrating or making decisions? N  Walking or climbing stairs? N  Dressing or bathing? N  Doing errands, shopping? N  Preparing Food and eating ? N  Using the Toilet? N  In the past six months, have you accidently leaked urine? N  Do you have problems with loss of bowel control? N  Managing your Medications? N  Managing your Finances? N  Housekeeping or managing your Housekeeping? N  Some recent data might be hidden     Immunizations and Health Maintenance Immunization History  Administered Date(s) Administered  . Fluad Quad(high Dose 65+) 12/23/2018  . PFIZER SARS-COV-2 Vaccination 03/27/2019, 04/24/2019  . Tdap 05/04/2012   There are no preventive care reminders to display for this patient.  Patient Care Team: Vivi Barrack, MD as PCP - General (Family Medicine)  Indicate any recent Medical Services you may have received from other than [facility] providers in the past year (date may be approximate).     Assessment:   This is a routine wellness examination for Kristy Trujillo.  Hearing/Vision screen No exam data present  Dietary issues and exercise activities discussed: Current Exercise Habits: The patient does not participate in regular exercise at present  Goals   None    Depression Screen PHQ 2/9 Scores 06/12/2019 03/11/2018  PHQ - 2 Score 2 2  PHQ- 9 Score 2 12    Fall Risk Fall Risk  06/12/2019 09/26/2018  Falls in the past year? 0 (No Data)  Comment - Emmi Telephone Survey: data to providers prior to load  Number falls in past yr: 0 (No Data)  Comment - Emmi Telephone Survey Actual Response =   Injury with Fall? 0 -  Follow up Falls evaluation completed;Education provided;Falls prevention discussed -    Is the patient's home free of loose throw rugs in  walkways, pet beds, electrical cords, etc?   yes      Grab bars in the bathroom? yes      Handrails on the stairs?   yes      Adequate lighting?   yes  Timed Get Up and Go Performed completed and within normal timeframe; no gait abnormalities noted   Cognitive Function: no cognitive concerns at this time    6CIT Screen 06/12/2019  What Year? 0 points  What month? 0 points  What time? 0 points  Count back from 20 0 points  Months in reverse 0 points  Repeat phrase 0 points  Total Score 0    Screening Tests Health Maintenance  Topic Date Due  .  MAMMOGRAM  06/11/2020 (Originally 07/29/1999)  . DEXA SCAN  06/11/2020 (Originally 07/29/2014)  . COLONOSCOPY  06/11/2020 (Originally 07/29/1999)  . Hepatitis C Screening  06/11/2020 (Originally 04-16-1950)  . PNA vac Low Risk Adult (1 of 2 - PCV13) 06/11/2020 (Originally 07/29/2014)  . INFLUENZA VACCINE  09/28/2019  . TETANUS/TDAP  05/05/2022    Qualifies for Shingles Vaccine? Discussed and patient will check with pharmacy for coverage.  Patient education handout provided   Cancer Screenings: Lung: Low Dose CT Chest recommended if Age 41-80 years, 30 pack-year currently smoking OR have quit w/in 15years. Patient does not qualify. Breast: Up to date on Mammogram? Yes; declines at this time  Up to date of Bone Density/Dexa? Yes; declines at this time  Colorectal: Declines at this time    Plan:    I have personally reviewed and addressed the Medicare Annual Wellness questionnaire and have noted the following in the patient's chart:  A. Medical and social history B. Use of alcohol, tobacco or illicit drugs  C. Current medications and supplements D. Functional ability and status E.  Nutritional status F.  Physical activity G. Advance directives H. List of other physicians I.  Hospitalizations, surgeries, and ER visits in previous 12 months J.  [place] such as hearing and vision if needed, cognitive and depression L. Referrals,  records requested, and appointments- none   In addition, I have reviewed and discussed with patient certain preventive protocols, quality metrics, and best practice recommendations. A written personalized care plan for preventive services as well as general preventive health recommendations were provided to patient.   Signed,  Kristy George, Trujillo  Nurse Health Advisor   Nurse Notes:

## 2019-06-12 NOTE — Patient Instructions (Signed)
It was very nice to see you today!  Please start the Valtrex for your shingles  I will also refill the antifungal pill and your other medications.  Let me know if not proving the next week or so.  Take care, Dr Jerline Pain  Please try these tips to maintain a healthy lifestyle:   Eat at least 3 REAL meals and 1-2 snacks per day.  Aim for no more than 5 hours between eating.  If you eat breakfast, please do so within one hour of getting up.    Each meal should contain half fruits/vegetables, one quarter protein, and one quarter carbs (no bigger than a computer mouse)   Cut down on sweet beverages. This includes juice, soda, and sweet tea.     Drink at least 1 glass of water with each meal and aim for at least 8 glasses per day   Exercise at least 150 minutes every week.

## 2019-06-12 NOTE — Assessment & Plan Note (Signed)
Stable.  Will refill Prozac 20 mg daily.

## 2019-07-14 ENCOUNTER — Telehealth: Payer: Self-pay | Admitting: Family Medicine

## 2019-07-14 NOTE — Telephone Encounter (Signed)
Please advise 

## 2019-07-14 NOTE — Telephone Encounter (Signed)
Patient calling stating she still had fungal infection and needs more medication. The shingles has clear up but fungal infection has come back. Please advise

## 2019-07-15 ENCOUNTER — Other Ambulatory Visit: Payer: Self-pay | Admitting: *Deleted

## 2019-07-15 MED ORDER — FLUCONAZOLE 150 MG PO TABS: ORAL_TABLET | ORAL | 0 refills | Status: DC

## 2019-07-15 NOTE — Telephone Encounter (Signed)
Rx order  

## 2019-07-15 NOTE — Telephone Encounter (Signed)
Ok to refill diflucan prescription.  Algis Greenhouse. Jerline Pain, MD 07/15/2019 8:00 AM

## 2019-08-13 ENCOUNTER — Other Ambulatory Visit: Payer: Self-pay | Admitting: *Deleted

## 2019-08-13 MED ORDER — GABAPENTIN 300 MG PO CAPS: 300.0000 mg | ORAL_CAPSULE | Freq: Three times a day (TID) | ORAL | 0 refills | Status: DC

## 2019-08-15 ENCOUNTER — Other Ambulatory Visit: Payer: Self-pay | Admitting: Family Medicine

## 2019-08-19 ENCOUNTER — Encounter: Payer: Medicare (Managed Care) | Admitting: Medical

## 2019-08-19 ENCOUNTER — Telehealth: Payer: Self-pay | Admitting: Family Medicine

## 2019-08-19 NOTE — Telephone Encounter (Signed)
Attempted to contact patient x3 to get her rescheduled for her pessary fitting. No answer and unable to leave a voicemail because it is full.

## 2019-08-20 ENCOUNTER — Other Ambulatory Visit: Payer: Self-pay | Admitting: Family Medicine

## 2019-08-21 NOTE — Telephone Encounter (Signed)
Pt requesting refill

## 2019-09-08 ENCOUNTER — Telehealth: Payer: Self-pay | Admitting: *Deleted

## 2019-09-08 NOTE — Telephone Encounter (Signed)
Pt left VM message and was wanting to confirm her appt on 7/20. I returned call to pt and was not able to leave a message. Per chart review, pt has not checked MyChart messages which were sent recently. Pt has office appt on 7/20 @ 10:15 am.

## 2019-09-16 ENCOUNTER — Other Ambulatory Visit: Payer: Self-pay

## 2019-09-16 ENCOUNTER — Ambulatory Visit (INDEPENDENT_AMBULATORY_CARE_PROVIDER_SITE_OTHER): Payer: Medicare (Managed Care) | Admitting: Obstetrics and Gynecology

## 2019-09-16 ENCOUNTER — Encounter: Payer: Self-pay | Admitting: Obstetrics and Gynecology

## 2019-09-16 VITALS — BP 145/70 | HR 90 | Ht 63.0 in | Wt 158.1 lb

## 2019-09-16 DIAGNOSIS — R32 Unspecified urinary incontinence: Secondary | ICD-10-CM

## 2019-09-16 DIAGNOSIS — Z1231 Encounter for screening mammogram for malignant neoplasm of breast: Secondary | ICD-10-CM

## 2019-09-16 NOTE — Progress Notes (Signed)
69 yo presenting today to discuss pessary fitting. Patient reports a history of urinary incontinence for the past 2 years roughly. She states that she has urge incontinence with several accidents per day. A family member has a pessary and she is interested in that treatment modality. Patient has not been evaluated by a urologist for this condition  Past Medical History:  Diagnosis Date   Gallstones    Shingles    Past Surgical History:  Procedure Laterality Date   CHOLECYSTECTOMY     Family History  Problem Relation Age of Onset   Diabetes Neg Hx    High Cholesterol Neg Hx    Hypertension Neg Hx    Colon cancer Neg Hx    Breast cancer Neg Hx    Social History   Tobacco Use   Smoking status: Never Smoker   Smokeless tobacco: Never Used  Substance Use Topics   Alcohol use: No   Drug use: No   ROS See pertinent in HPI  Blood pressure (!) 145/70, pulse 90, height 5\' 3"  (1.6 m), weight 158 lb 1.6 oz (71.7 kg). GENERAL: Well-developed, well-nourished female in no acute distress.  BREASTS: Symmetric in size. No palpable masses or lymphadenopathy, skin changes, or nipple drainage. ABDOMEN: Soft, nontender, nondistended. No organomegaly. PELVIC: Normal external female genitalia. Vagina is pale and atrophic.  Normal discharge. Normal appearing cervix. Uterus is normal in size. No adnexal mass or tenderness. EXTREMITIES: No cyanosis, clubbing, or edema, 2+ distal pulses.  A/P 69 yo with urinary incontinence - Attempted to insert a 70 mm ring without success - Discussed returning to 55 mm ring with knob fitting or referral to uro gyn. Patient opted for urogynecology referral for complete evaluation of her incontinence - screening mammogram ordered

## 2019-09-16 NOTE — Progress Notes (Signed)
Scheduled appt with Dr. Maryland Pink, urogyn, for October 13th @ 1300.  Pt provided with information to the appt.

## 2019-10-01 ENCOUNTER — Other Ambulatory Visit: Payer: Self-pay

## 2019-10-01 ENCOUNTER — Ambulatory Visit
Admission: RE | Admit: 2019-10-01 | Discharge: 2019-10-01 | Disposition: A | Discharge status: Home / Self Care | Payer: Medicare (Managed Care) | Source: Ambulatory Visit | Attending: Obstetrics and Gynecology | Admitting: Obstetrics and Gynecology

## 2019-10-01 DIAGNOSIS — Z1231 Encounter for screening mammogram for malignant neoplasm of breast: Secondary | ICD-10-CM

## 2019-10-02 ENCOUNTER — Other Ambulatory Visit: Payer: Self-pay | Admitting: Obstetrics and Gynecology

## 2019-10-02 DIAGNOSIS — N63 Unspecified lump in unspecified breast: Secondary | ICD-10-CM

## 2019-10-16 ENCOUNTER — Other Ambulatory Visit: Payer: Self-pay

## 2019-10-16 ENCOUNTER — Ambulatory Visit
Admission: RE | Admit: 2019-10-16 | Discharge: 2019-10-16 | Disposition: A | Discharge status: Home / Self Care | Payer: Medicare (Managed Care) | Source: Ambulatory Visit | Attending: Obstetrics and Gynecology | Admitting: Obstetrics and Gynecology

## 2019-10-16 DIAGNOSIS — N63 Unspecified lump in unspecified breast: Secondary | ICD-10-CM

## 2019-12-26 ENCOUNTER — Encounter: Payer: Self-pay | Admitting: *Deleted

## 2019-12-28 ENCOUNTER — Other Ambulatory Visit: Payer: Self-pay | Admitting: Family Medicine

## 2019-12-29 NOTE — Telephone Encounter (Signed)
LAST APPOINTMENT DATE: 06/12/2019   NEXT APPOINTMENT DATE: Visit date not found    LAST REFILL:  08/01/2019  QTY: 90

## 2020-03-27 ENCOUNTER — Other Ambulatory Visit: Payer: Self-pay | Admitting: Family Medicine

## 2020-03-31 NOTE — Telephone Encounter (Signed)
LAST APPOINTMENT DATE: 06/12/2019   NEXT APPOINTMENT DATE: Visit date not found

## 2020-03-31 NOTE — Telephone Encounter (Signed)
Rx sent in. Please ask her to schedule f/u visit soon.

## 2020-08-09 ENCOUNTER — Telehealth: Payer: Self-pay

## 2020-08-09 NOTE — Telephone Encounter (Signed)
Nurse Assessment Nurse: Fabio Neighbors, RN, [name] Date/Time (Eastern Time): 08/08/2020 3:24:04 AM Confirm and document reason for call. If symptomatic, describe symptoms. ---Caller states that she is experiencing difficulty sleeping. Her father has passed in the past 3 weeks. Her dog passed away also. She is requesting an appointment so that she can get her Clonazepam dosage to be increased. Does the patient have any new or worsening symptoms? ---Yes Will a triage be completed? ---Yes Related visit to physician within the last 2 weeks? ---Yes Does the PT have any chronic conditions? (i.e. diabetes, asthma, this includes High risk factors for pregnancy, etc.) ---Yes List chronic conditions. ---insomnia, anxiety Is this a behavioral health or substance abuse call? ---No Nurse: Fabio Neighbors, RN, [name] Date/Time (Eastern Time): 08/08/2020 3:34:39 AM Please select the assessment type ---Request for controlled medication refill Is there an on-call physician for the client? ---Yes Do the client directives specifically allow for paging the on-call regarding scheduled drugs? ---No PLEASE NOTE: All timestamps contained within this report are represented as [place] Standard Time. CONFIDENTIALTY NOTICE: This fax transmission is intended only for the addressee. It contains information that is legally privileged, confidential or otherwise protected from use or disclosure. If you are not the intended recipient, you are strictly prohibited from reviewing, disclosing, copying using or disseminating any of this information or taking any action in reliance on or regarding this information. If you have received this fax in error, please notify us immediately by telephone so that we can arrange for its return to [place]. Phone: [phone], Toll-Free: [phone], Fax: [phone] Page: 2 of 2 Call Id: [id number] Guidelines Guideline Title Affirmed Question Affirmed Notes Nurse Date/Time Eilene Ghazi Time) Leg Swelling  and Edema [1] MODERATE leg swelling (e.g., swelling extends up to knees) AND [2] new-onset or worsening Fabio Neighbors, RN, [name] 08/08/2020 3:28:50 AM Disp. Time Eilene Ghazi Time) Disposition Final User 08/08/2020 3:19:23 AM Attempt made - message left [place], RN, Perrin Smack 08/08/2020 3:36:32 AM See PCP within 24 Hours Yes [place], RN, Perrin Smack Caller Disagree/Comply Disagree Caller Understands Yes PreDisposition Home Care Care Advice Given Per Guideline SEE PCP WITHIN 24 HOURS: LEG SWELLING OR EDEMA: * Elevate your legs or try to lie down one or two times a day for 20 minutes. * Wear comfortable shoes. * Avoid socks with an elastic band at the top. CALL BACK IF: * Breathing difficulty or chest pain occurs * You become worse Comments User: Romie Jumper, RN Date/Time Eilene Ghazi Time): 08/08/2020 3:28:24 AM Caller states that she has fluid all over her. User: Romie Jumper, RN Date/Time Eilene Ghazi Time): 08/08/2020 3:35:45 AM Explained to caller that she will need to be evaluated for the increase in her Clonazepam. She does not wish to go to an [facility]. She wants to see her own doctor on Monday. User: Romie Jumper, RN Date/Time Eilene Ghazi Time): 08/08/2020 3:36:28 AM Caller states that the edema is generalized. Denies any heart hx, difficulty breathing, or chest pain/pressure. Referrals REFERRED TO PCP OFFICE

## 2020-08-09 NOTE — Telephone Encounter (Signed)
Left detailed message for patient to call clinic to schedule appt

## 2020-08-12 ENCOUNTER — Encounter: Payer: Self-pay | Admitting: Family Medicine

## 2020-08-12 ENCOUNTER — Telehealth (INDEPENDENT_AMBULATORY_CARE_PROVIDER_SITE_OTHER): Payer: Medicare (Managed Care) | Admitting: Family Medicine

## 2020-08-12 VITALS — Ht 63.0 in

## 2020-08-12 DIAGNOSIS — N3281 Overactive bladder: Secondary | ICD-10-CM | POA: Diagnosis not present

## 2020-08-12 DIAGNOSIS — F419 Anxiety disorder, unspecified: Secondary | ICD-10-CM | POA: Diagnosis not present

## 2020-08-12 DIAGNOSIS — F325 Major depressive disorder, single episode, in full remission: Secondary | ICD-10-CM

## 2020-08-12 MED ORDER — MIRABEGRON ER 25 MG PO TB24
25.0000 mg | ORAL_TABLET | Freq: Every day | ORAL | 5 refills | Status: DC
Start: 2020-08-12 — End: 2020-12-09

## 2020-08-12 MED ORDER — FLUOXETINE HCL 40 MG PO CAPS
40.0000 mg | ORAL_CAPSULE | Freq: Every day | ORAL | 3 refills | Status: DC
Start: 2020-08-12 — End: 2020-09-27

## 2020-08-12 MED ORDER — CLONAZEPAM 1 MG PO TABS: 1.0000 mg | ORAL_TABLET | Freq: Two times a day (BID) | ORAL | 1 refills | Status: DC | PRN

## 2020-08-12 NOTE — Assessment & Plan Note (Signed)
Worsened recently with the passing of her father.  We will increase her Prozac to 40 mg daily.  We will also increase dose of clonazepam.  She will check in with me in a couple of weeks via MyChart.

## 2020-08-12 NOTE — Progress Notes (Signed)
    Kristy Trujillo is a 70 y.o. female who presents today for a virtual office visit.  Assessment/Plan:  Chronic Problems Addressed Today: OAB (overactive bladder) Has followed with urology.  We will refill her Myrbetriq.  Anxiety Worsened recently with the passing of her father.  We will increase her Prozac to 40 mg daily.  We will also increase dose of clonazepam.  She will check in with me in a couple of weeks via MyChart.  Depression, major, in remission ([place]) Worsened.  Has had several life stressors recently.  We will increase her Prozac to 40 mg daily and increase clonazepam to 1 mg twice daily as needed.  She will check with me in a couple of weeks via MyChart.    Subjective:  HPI:  See A/p.         Objective/Observations  Physical Exam: Gen: NAD, resting comfortably Pulm: Normal work of breathing Neuro: Grossly normal, moves all extremities Psych: Normal affect and thought content  Virtual Visit via Video   I connected with Kristy Trujillo on @TODAY @ at  3:40 PM EDT by a video enabled telemedicine application and verified that I am speaking with the correct person using two identifiers. The limitations of evaluation and management by telemedicine and the availability of in person appointments were discussed. The patient expressed understanding and agreed to proceed.   Patient location: Home Provider location: [place] participating in the virtual visit: Myself and Patient     Kristy Trujillo. Jerline Pain, MD 08/12/2020 3:42 PM

## 2020-08-12 NOTE — Assessment & Plan Note (Signed)
Worsened.  Has had several life stressors recently.  We will increase her Prozac to 40 mg daily and increase clonazepam to 1 mg twice daily as needed.  She will check with me in a couple of weeks via MyChart.

## 2020-08-12 NOTE — Assessment & Plan Note (Signed)
Has followed with urology.  We will refill her Myrbetriq.

## 2020-08-13 ENCOUNTER — Other Ambulatory Visit: Payer: Self-pay | Admitting: Family Medicine

## 2020-09-24 ENCOUNTER — Other Ambulatory Visit: Payer: Self-pay | Admitting: Family Medicine

## 2020-09-26 ENCOUNTER — Other Ambulatory Visit: Payer: Self-pay | Admitting: Family Medicine

## 2020-09-27 ENCOUNTER — Other Ambulatory Visit: Payer: Self-pay | Admitting: *Deleted

## 2020-09-27 MED ORDER — FLUOXETINE HCL 40 MG PO CAPS: 40.0000 mg | ORAL_CAPSULE | Freq: Two times a day (BID) | ORAL | 3 refills | Status: DC

## 2020-09-27 NOTE — Telephone Encounter (Signed)
Pharmacy send refill request for Rx Fluoxetine '40mg'$  Per patient taking BID  Called patient to verified Rx intake, patient stated taking '40mg'$  BID. Medication working good for her by taking medication bid Requesting Refills, please advise

## 2020-09-27 NOTE — Telephone Encounter (Signed)
New Rx send to pharmacy

## 2020-11-02 ENCOUNTER — Telehealth: Payer: Self-pay

## 2020-11-02 NOTE — Telephone Encounter (Signed)
Are they referring to lasix? I am not sure if we have discussed this before - I do not see it mention in her chart. Can we have her come in for an office visit.  Algis Greenhouse. Jerline Pain, MD 11/02/2020 3:32 PM

## 2020-11-02 NOTE — Telephone Encounter (Signed)
Pt called stating that she needs medicine for swelling/fluid in her ankles. She would like Dr Jerline Pain to call her in a prescription. Kristy Trujillo stated that Dr Jerline Pain is aware. Then she stated that Dr Rodena Piety at her female doctor brought up for Dr Jerline Pain to put her on the medication as well. Please Advise.

## 2020-11-02 NOTE — Telephone Encounter (Signed)
See below

## 2020-11-02 NOTE — Telephone Encounter (Signed)
Please schedule pt for OV per Dr. Jerline Pain.

## 2020-11-03 ENCOUNTER — Other Ambulatory Visit: Payer: Self-pay | Admitting: Family Medicine

## 2020-11-03 NOTE — Telephone Encounter (Signed)
Next available appt is 9/26. Can she wait that long?

## 2020-11-03 NOTE — Telephone Encounter (Signed)
Ok to wait until 09/26?

## 2020-11-04 NOTE — Telephone Encounter (Signed)
It is ok to wait as long as she is not having any other symptoms.  Algis Greenhouse. Jerline Pain, MD 11/04/2020 8:00 AM

## 2020-11-04 NOTE — Telephone Encounter (Signed)
See below

## 2020-11-04 NOTE — Telephone Encounter (Signed)
LVM for pt to call the office back.  °

## 2020-11-05 NOTE — Telephone Encounter (Signed)
Patient is scheduled for 9/27

## 2020-11-23 ENCOUNTER — Ambulatory Visit (INDEPENDENT_AMBULATORY_CARE_PROVIDER_SITE_OTHER): Payer: Medicare (Managed Care) | Admitting: Family Medicine

## 2020-11-23 ENCOUNTER — Encounter: Payer: Self-pay | Admitting: Family Medicine

## 2020-11-23 ENCOUNTER — Other Ambulatory Visit: Payer: Self-pay

## 2020-11-23 VITALS — BP 125/72 | HR 87 | Temp 98.1°F | Ht 63.0 in | Wt 154.0 lb

## 2020-11-23 DIAGNOSIS — R600 Localized edema: Secondary | ICD-10-CM | POA: Diagnosis not present

## 2020-11-23 DIAGNOSIS — F325 Major depressive disorder, single episode, in full remission: Secondary | ICD-10-CM | POA: Diagnosis not present

## 2020-11-23 DIAGNOSIS — F419 Anxiety disorder, unspecified: Secondary | ICD-10-CM

## 2020-11-23 DIAGNOSIS — N3281 Overactive bladder: Secondary | ICD-10-CM | POA: Diagnosis not present

## 2020-11-23 MED ORDER — HYDROCHLOROTHIAZIDE 12.5 MG PO CAPS: 12.5000 mg | ORAL_CAPSULE | Freq: Every day | ORAL | 3 refills | Status: DC

## 2020-11-23 NOTE — Progress Notes (Signed)
   Kristy Trujillo is a 70 y.o. female who presents today for an office visit.  Assessment/Plan:  Chronic Problems Addressed Today: OAB (overactive bladder) She is on Myrbetriq.  Has followed with Dr. MAtthews at [facility].  She is also having some issues with bowel movements as well.  She does not wish to see the urogynecologist again at this point.  We will place referral to see pelvic floor rehab.  Anxiety Stable on Prozac 40 mg twice daily and clonazepam 1 mg twice daily as needed.  Depression, major, in remission (HCC) Stable on Prozac 40 mg twice daily.  Leg edema No red flags.  Her gynecologist told her to start on a diuretic.  She has failed conservative management.  We will start HCTZ 12.5 mg once daily.  She will come back in 1 to 2 weeks to recheck blood pressure.  We will also recheck labs at that point including CBC, c-Met, and TSH.     Subjective:  HPI:  She complain of edema in her legs and ankles. She would like to start a medicine for this problem. she reports it is intermittent. She has been following with urogyn for overactive bladder.  She was told she needed to be started on a fluid pill.  Has swelling in both legs the right is worse than left.   Her blood pressure at office was stable. It was around 125/72. She does not check her blood pressure at home.         Objective:  Physical Exam: BP 125/72   Pulse 87   Temp 98.1 F (36.7 C) (Temporal)   Ht 5' 3" (1.6 m)   Wt 154 lb (69.9 kg)   BMI 27.28 kg/m   Gen: No acute distress, resting comfortably CV: Regular rate and rhythm with no murmurs appreciated Pulm: Normal work of breathing, clear to auscultation bilaterally with no crackles, wheezes, or rhonchi MSK: 1+ edema in bilateral legs Neuro: Grossly normal, moves all extremities Psych: Normal affect and thought content        I,[name],acting as a scribe for Kristy Parker, MD.,have documented all relevant documentation on the behalf of Kristy  Parker, MD,as directed by  Kristy Parker, MD while in the presence of Kristy Parker, MD.   I, Kristy Parker, MD, have reviewed all documentation for this visit. The documentation on 11/23/20 for the exam, diagnosis, procedures, and orders are all accurate and complete.  Kristy M. Parker, MD 11/23/2020 2:00 PM   

## 2020-11-23 NOTE — Assessment & Plan Note (Signed)
She is on Myrbetriq.  Has followed with Dr. Zigmund Daniel at [facility].  She is also having some issues with bowel movements as well.  She does not wish to see the urogynecologist again at this point.  We will place referral to see pelvic floor rehab.

## 2020-11-23 NOTE — Patient Instructions (Signed)
It was very nice to see you today!  We will start fluid pill.  Please take every morning.  Come back in 1 to 2 weeks to recheck your blood pressure and recheck blood work.  I will also refer you to see a pelvic floor specialist.  Take care, Dr Jerline Pain  PLEASE NOTE:  If you had any lab tests please let us know if you have not heard back within a few days. You may see your results on mychart before we have a chance to review them but we will give you a call once they are reviewed by [place]. If we ordered any referrals today, please let us know if you have not heard from their office within the next week.   Please try these tips to maintain a healthy lifestyle:  Eat at least 3 REAL meals and 1-2 snacks per day.  Aim for no more than 5 hours between eating.  If you eat breakfast, please do so within one hour of getting up.   Each meal should contain half fruits/vegetables, one quarter protein, and one quarter carbs (no bigger than a computer mouse)  Cut down on sweet beverages. This includes juice, soda, and sweet tea.   Drink at least 1 glass of water with each meal and aim for at least 8 glasses per day  Exercise at least 150 minutes every week.

## 2020-11-23 NOTE — Assessment & Plan Note (Signed)
Stable on Prozac 40 mg twice daily.

## 2020-11-23 NOTE — Assessment & Plan Note (Signed)
Stable on Prozac 40 mg twice daily and clonazepam 1 mg twice daily as needed.

## 2020-11-23 NOTE — Assessment & Plan Note (Signed)
No red flags.  Her gynecologist told her to start on a diuretic.  She has failed conservative management.  We will start HCTZ 12.5 mg once daily.  She will come back in 1 to 2 weeks to recheck blood pressure.  We will also recheck labs at that point including CBC, c-Met, and TSH.

## 2020-12-09 ENCOUNTER — Ambulatory Visit (INDEPENDENT_AMBULATORY_CARE_PROVIDER_SITE_OTHER): Payer: Medicare (Managed Care) | Admitting: Family Medicine

## 2020-12-09 ENCOUNTER — Other Ambulatory Visit: Payer: Self-pay

## 2020-12-09 ENCOUNTER — Encounter: Payer: Self-pay | Admitting: Family Medicine

## 2020-12-09 VITALS — BP 147/74 | HR 87 | Temp 98.0°F | Ht 63.0 in | Wt 152.2 lb

## 2020-12-09 DIAGNOSIS — R600 Localized edema: Secondary | ICD-10-CM | POA: Diagnosis not present

## 2020-12-09 DIAGNOSIS — E1159 Type 2 diabetes mellitus with other circulatory complications: Secondary | ICD-10-CM | POA: Insufficient documentation

## 2020-12-09 DIAGNOSIS — H9313 Tinnitus, bilateral: Secondary | ICD-10-CM

## 2020-12-09 DIAGNOSIS — I10 Essential (primary) hypertension: Secondary | ICD-10-CM | POA: Diagnosis not present

## 2020-12-09 DIAGNOSIS — N3281 Overactive bladder: Secondary | ICD-10-CM | POA: Diagnosis not present

## 2020-12-09 LAB — COMPREHENSIVE METABOLIC PANEL
ALT: 10 U/L (ref 0–35)
AST: 16 U/L (ref 0–37)
Albumin: 3.9 g/dL (ref 3.5–5.2)
Alkaline Phosphatase: 110 U/L (ref 39–117)
BUN: 49 mg/dL — ABNORMAL HIGH (ref 6–23)
CO2: 25 mEq/L (ref 19–32)
Calcium: 9 mg/dL (ref 8.4–10.5)
Chloride: 103 mEq/L (ref 96–112)
Creatinine, Ser: 1.49 mg/dL — ABNORMAL HIGH (ref 0.40–1.20)
GFR: 35.16 mL/min — ABNORMAL LOW (ref >60.00)
Glucose, Bld: 340 mg/dL — ABNORMAL HIGH (ref 70–99)
Potassium: 4.4 mEq/L (ref 3.5–5.1)
Sodium: 137 mEq/L (ref 135–145)
Total Bilirubin: 0.3 mg/dL (ref 0.2–1.2)
Total Protein: 7.5 g/dL (ref 6.0–8.3)

## 2020-12-09 LAB — CBC
HCT: 36.5 % (ref 36.0–46.0)
Hemoglobin: 11.9 g/dL — ABNORMAL LOW (ref 12.0–15.0)
MCHC: 32.7 g/dL (ref 30.0–36.0)
MCV: 85.4 fl (ref 78.0–100.0)
Platelets: 310 10*3/uL (ref 150.0–400.0)
RBC: 4.28 Mil/uL (ref 3.87–5.11)
RDW: 13.9 % (ref 11.5–15.5)
WBC: 10.6 10*3/uL — ABNORMAL HIGH (ref 4.0–10.5)

## 2020-12-09 MED ORDER — PHENAZOPYRIDINE HCL 200 MG PO TABS: 200.0000 mg | ORAL_TABLET | Freq: Three times a day (TID) | ORAL | 0 refills | Status: DC | PRN

## 2020-12-09 MED ORDER — HYDROCHLOROTHIAZIDE 25 MG PO TABS: 25.0000 mg | ORAL_TABLET | Freq: Every day | ORAL | 3 refills | Status: DC

## 2020-12-09 MED ORDER — MIRABEGRON ER 25 MG PO TB24
25.0000 mg | ORAL_TABLET | Freq: Every day | ORAL | 5 refills | Status: DC
Start: 2020-12-09 — End: 2022-05-19

## 2020-12-09 NOTE — Assessment & Plan Note (Signed)
Modestly better since last time.  Will increase dose of HCTZ to 25 mg daily and she will follow-up with [place] in a couple of weeks.

## 2020-12-09 NOTE — Assessment & Plan Note (Signed)
Recent flare.  She has seen urogynecology at [facility] in the past.  We will refill her Myrbetriq and Pyridium per her request.  She will let me know if symptoms do not improve over the next several weeks.

## 2020-12-09 NOTE — Assessment & Plan Note (Signed)
At goal per JNC 8.  She is still having some leg edema.  Will increase dose of HCTZ to 25 mg daily.  Check labs today.  She will check in with [place] in a couple weeks.

## 2020-12-09 NOTE — Progress Notes (Signed)
   Kristy Trujillo is a 70 y.o. female who presents today for an office visit.  Assessment/Plan:  Chronic Problems Addressed Today: Tinnitus of both ears Possibly age-related.  No red flags.  She will come back for hearing test.  Likely needs referral to audiology.  Essential hypertension At goal per JNC 8.  She is still having some leg edema.  Will increase dose of HCTZ to 25 mg daily.  Check labs today.  She will check in with [place] in a couple weeks.  Leg edema Modestly better since last time.  Will increase dose of HCTZ to 25 mg daily and she will follow-up with [place] in a couple of weeks.  OAB (overactive bladder) Recent flare.  She has seen urogynecology at [facility] in the past.  We will refill her Myrbetriq and Pyridium per her request.  She will let me know if symptoms do not improve over the next several weeks.     Subjective:  HPI:   She has had leg edema and was started on Hydrochlorothiazide 12.5 mg once daily in our last visit. She reports she still have some swelling in the ankle. She would like to increase the dose of HCTZ.  She is here with ringing in her ear. This has been going on for a long time. Hearing has been normal. No reported dizziness.   She have a hx of overreactive bladder. She follows up with urology for this. She is having some issue with urine. She is requesting a refill on her medication.        Objective:  Physical Exam: BP (!) 147/74   Pulse 87   Temp 98 F (36.7 C) (Temporal)   Ht 5\' 3"  (1.6 m)   Wt 152 lb 3.2 oz (69 kg)   SpO2 100%   BMI 26.96 kg/m   Gen: No acute distress, resting comfortably HEENT: TMs clear bilaterally CV: Regular rate and rhythm with no murmurs appreciated Pulm: Normal work of breathing, clear to auscultation bilaterally with no crackles, wheezes, or rhonchi Neuro: Grossly normal, moves all extremities Psych: Normal affect and thought content       I,[name],acting as a scribe for Dimas Chyle, MD.,have  documented all relevant documentation on the behalf of Dimas Chyle, MD,as directed by  Dimas Chyle, MD while in the presence of Dimas Chyle, MD.   I, Dimas Chyle, MD, have reviewed all documentation for this visit. The documentation on 12/09/20 for the exam, diagnosis, procedures, and orders are all accurate and complete.  Algis Greenhouse. Jerline Pain, MD 12/09/2020 1:53 PM

## 2020-12-09 NOTE — Patient Instructions (Signed)
It was very nice to see you today!  Please increase your blood pressure medication to 25 mg daily.  We will check blood work today.  We will recheck your hearing test next time you come in the office.  Take care, Dr Jerline Pain  PLEASE NOTE:  If you had any lab tests please let us know if you have not heard back within a few days. You may see your results on mychart before we have a chance to review them but we will give you a call once they are reviewed by [place]. If we ordered any referrals today, please let us know if you have not heard from their office within the next week.   Please try these tips to maintain a healthy lifestyle:  Eat at least 3 REAL meals and 1-2 snacks per day.  Aim for no more than 5 hours between eating.  If you eat breakfast, please do so within one hour of getting up.   Each meal should contain half fruits/vegetables, one quarter protein, and one quarter carbs (no bigger than a computer mouse)  Cut down on sweet beverages. This includes juice, soda, and sweet tea.   Drink at least 1 glass of water with each meal and aim for at least 8 glasses per day  Exercise at least 150 minutes every week.

## 2020-12-09 NOTE — Assessment & Plan Note (Signed)
Possibly age-related.  No red flags.  She will come back for hearing test.  Likely needs referral to audiology.

## 2020-12-10 ENCOUNTER — Other Ambulatory Visit: Payer: Self-pay | Admitting: Family Medicine

## 2020-12-10 NOTE — Progress Notes (Signed)
Please inform patient of the following:  Her blood sugar is very high and in the diabetic range. I recommend that she come back ASAP to check an A1c. I would like for this to be an office visit so that we can discuss the results at her visit.  Her kidney function is also decreased. I do not have any other labs to compare with. We can discuss next steps are her follow up visit but can we see if we can get any recent labs from her previous doctors?  Kristy Trujillo. Jerline Pain, MD 12/10/2020 8:03 AM

## 2020-12-17 ENCOUNTER — Ambulatory Visit: Payer: Medicare (Managed Care) | Admitting: Physical Therapy

## 2020-12-22 ENCOUNTER — Telehealth: Payer: Self-pay

## 2020-12-22 NOTE — Telephone Encounter (Signed)
Pt called stating that she needs labs done. I do not see orders put in. Megann stated that she needs to redo them from the previous time. Can orders be placed. Please Advise.

## 2020-12-23 ENCOUNTER — Other Ambulatory Visit: Payer: Self-pay | Admitting: Family Medicine

## 2020-12-24 NOTE — Telephone Encounter (Signed)
Patient need to schedule appointment with Dr Jerline Pain to repeat  A1C

## 2020-12-24 NOTE — Telephone Encounter (Signed)
Pt scheduled  

## 2020-12-29 ENCOUNTER — Ambulatory Visit: Payer: Medicare (Managed Care) | Admitting: Physical Therapy

## 2020-12-29 ENCOUNTER — Other Ambulatory Visit: Payer: Medicare (Managed Care)

## 2020-12-31 ENCOUNTER — Other Ambulatory Visit: Payer: Self-pay | Admitting: Physician Assistant

## 2020-12-31 MED ORDER — CLONAZEPAM 1 MG PO TABS: 1.0000 mg | ORAL_TABLET | Freq: Two times a day (BID) | ORAL | 0 refills | Status: DC | PRN

## 2021-01-11 ENCOUNTER — Ambulatory Visit: Payer: Medicare (Managed Care) | Admitting: Physical Therapy

## 2021-01-11 ENCOUNTER — Other Ambulatory Visit: Payer: Medicare (Managed Care)

## 2021-01-31 ENCOUNTER — Other Ambulatory Visit: Payer: Self-pay | Admitting: *Deleted

## 2021-01-31 ENCOUNTER — Ambulatory Visit: Payer: Medicare (Managed Care) | Admitting: Physical Therapy

## 2021-01-31 ENCOUNTER — Other Ambulatory Visit (INDEPENDENT_AMBULATORY_CARE_PROVIDER_SITE_OTHER): Payer: Medicare (Managed Care)

## 2021-01-31 ENCOUNTER — Other Ambulatory Visit: Payer: Self-pay

## 2021-01-31 DIAGNOSIS — R7309 Other abnormal glucose: Secondary | ICD-10-CM

## 2021-02-01 ENCOUNTER — Encounter: Payer: Self-pay | Admitting: Physical Therapy

## 2021-02-01 ENCOUNTER — Ambulatory Visit: Payer: Medicare (Managed Care) | Attending: Family Medicine | Admitting: Physical Therapy

## 2021-02-01 DIAGNOSIS — M6281 Muscle weakness (generalized): Secondary | ICD-10-CM | POA: Insufficient documentation

## 2021-02-01 DIAGNOSIS — R279 Unspecified lack of coordination: Secondary | ICD-10-CM | POA: Diagnosis present

## 2021-02-01 LAB — HEMOGLOBIN A1C: Hgb A1c MFr Bld: 9.8 % — ABNORMAL HIGH (ref 4.6–6.5)

## 2021-02-01 NOTE — Progress Notes (Signed)
Please inform patient of the following:  Her A1c is very high and in the diabetic range. She needs an appointment ASAP to discuss treatment.  Algis Greenhouse. Jerline Pain, MD 02/01/2021 1:18 PM

## 2021-02-01 NOTE — Therapy (Signed)
[facility] @ [address] [place], [place], [address] Phone: [phone]   Fax:  [phone]  Physical Therapy Evaluation  Patient Details  Name: Kristy Trujillo MRN: [id number] Date of Birth: [birth year] Referring Provider (PT): Vivi Barrack, MD   Encounter Date: 02/01/2021   PT End of Session - 02/01/21 2032     Visit Number 1    Date for PT Re-Evaluation 04/26/21    Authorization Type [employer] medicare    PT Start Time 1400    PT Stop Time 1443    PT Time Calculation (min) 43 min    Activity Tolerance Patient tolerated treatment well    Behavior During Therapy [facility] for tasks assessed/performed             Past Medical History:  Diagnosis Date   Gallstones    Shingles     Past Surgical History:  Procedure Laterality Date   CHOLECYSTECTOMY      There were no vitals filed for this visit.    Subjective Assessment - 02/01/21 1403     Subjective Pt states she is having leakage of both bowel and bladder.  It happens fast and I just can't make it.  Pt is wearing the biggest sized pads from 1-4/day.  I had the "surgery" end of December.  Pt is not sure what the surgery was but it was needles that were supposed to make the bladder better    Patient Stated Goals getting control of the bladder and bowels    Currently in Pain? No/denies                [facility] PT Assessment - 02/01/21 0001       Assessment   Medical Diagnosis N32.81 (ICD-10-CM) - OAB (overactive bladder)    Referring Provider (PT) Vivi Barrack, MD    Prior Therapy No      Precautions   Precautions None      Balance Screen   Has the patient fallen in the past 6 months No    Has the patient had a decrease in activity level because of a fear of falling?  Yes    Is the patient reluctant to leave their home because of a fear of falling?  Yes      Home Environment   Living Environment Private residence    Living Arrangements Other  relatives   sister     Prior Function   Level of Independence Independent      Cognition   Overall Cognitive Status Within Functional Limits for tasks assessed      Posture/Postural Control   Posture/Postural Control Postural limitations      ROM / Strength   AROM / PROM / Strength AROM;PROM;Strength      AROM   Overall AROM Comments lumbar flexion      PROM   Overall PROM Comments Rt hip rotation 50%      Strength   Overall Strength Comments hip abduction and ext 4/5      Flexibility   Soft Tissue Assessment /Muscle Length yes    Hamstrings normal      Ambulation/Gait   Gait Pattern Decreased step length - right;Trunk flexed      Standardized Balance Assessment   Standardized Balance Assessment Five Times Sit to Stand    Five times sit to stand comments  22 sec  Objective measurements completed on examination: See above findings.     Pelvic Floor Special Questions - 02/01/21 0001     Prior Pregnancies Yes    Number of Pregnancies 2    Number of Vaginal Deliveries 2    Urinary Leakage Yes    How often daily    Pad use up to 4 large    Activities that cause leaking Coughing;Sneezing;Walking    Urinary urgency No    Urinary frequency every 2 hours    Fecal incontinence Yes    Fluid intake 2-3 glasses of water    Pelvic Floor Internal Exam pt identity confirmed and internal assessment with consent    Exam Type Vaginal    Palpation tight thoughout vaginal wall    Strength weak squeeze, no lift    Strength # of seconds 2    Tone mixed mostly high              No emotional/communication barriers or cognitive limitation. Patient is motivated to learn. Patient understands and agrees with treatment goals and plan. PT explains patient will be examined in standing, sitting, and lying down to see how their muscles and joints work. When they are ready, they will be asked to remove their underwear so PT can examine their  perineum. The patient is also given the option of providing their own chaperone as one is not provided in our facility. The patient also has the right and is explained the right to defer or refuse any part of the evaluation or treatment including the internal exam. With the patient's consent, PT will use one gloved finger to gently assess the muscles of the pelvic floor, seeing how well it contracts and relaxes and if there is muscle symmetry. After, the patient will get dressed and PT and patient will discuss exam findings and plan of care. PT and patient discuss plan of care, schedule, attendance policy and HEP activities.            PT Short Term Goals - 02/01/21 2039       PT SHORT TERM GOAL #1   Title ind with initial HEP    Time 4    Period Weeks    Status New    Target Date 03/01/21      PT SHORT TERM GOAL #2   Title 5x STS improved to less than 18 seconds    Baseline 22 seconds    Time 4    Period Weeks    Status New    Target Date 03/01/21               PT Long Term Goals - 02/01/21 2039       PT LONG TERM GOAL #1   Title ind with advanced HEP    Time 12    Period Weeks    Status New    Target Date 04/26/21      PT LONG TERM GOAL #2   Title Pt will report no fecal leakage for at least 2 weeks    Time 12    Period Weeks    Status New    Target Date 04/26/21      PT LONG TERM GOAL #3   Title Pt is down to 1-2 small pads/day    Baseline 4 extra large pads    Time 12    Period Weeks    Status New    Target Date 04/26/21      PT LONG TERM GOAL #  4   Title Pt will demonstrate 5x sit to stand in <12 seconds for reduced risk of falls    Time 12    Period Weeks    Status New    Target Date 04/26/21                    Plan - 02/01/21 1438     Clinical Impression Statement Pt presents to clinic today due to incontinence of bladder and bowel.  Pt has tight Rt hip rotation and weaknessof hip abduction.  Pt has weak pelvic floor with tension  in walls of vaginal canal. Pt has slightly higher tone pelvic floor posteriorly  Pt has 2/5 MMT and hold 2 seconds.  Pt was holding her breath when trying to contract the pelvic floor. Pt will benefit from skilled PT to address impairments for incontinence as well as balance deficits noted above.    Examination-Activity Limitations Continence;Toileting;Sleep    Examination-Participation Restrictions Community Activity    Stability/Clinical Decision Making Evolving/Moderate complexity    Clinical Decision Making Moderate    Rehab Potential Excellent    PT Frequency 1x / week    PT Duration 12 weeks    PT Treatment/Interventions ADLs/Self Care Home Management;Biofeedback;Cryotherapy;Electrical Stimulation;Moist Heat;Therapeutic activities;Neuromuscular re-education;Therapeutic exercise;Patient/family education;Manual techniques;Passive range of motion;Dry needling;Taping    PT Next Visit Plan internal STM to stretch and release and educate on using coconut oil; initial breathing and bulge    PT Home Exercise Plan not given yet    Consulted and Agree with Plan of Care Patient             Patient will benefit from skilled therapeutic intervention in order to improve the following deficits and impairments:  Difficulty walking, Decreased coordination, Decreased strength, Decreased range of motion, Increased fascial restricitons  Visit Diagnosis: Muscle weakness (generalized)  Unspecified lack of coordination     Problem List Patient Active Problem List   Diagnosis Date Noted   Essential hypertension 12/09/2020   Tinnitus of both ears 12/09/2020   Leg edema 11/23/2020   OAB (overactive bladder) 08/12/2020   Lipoma 03/11/2018   Depression, major, in remission ([place]) 03/11/2018   Anxiety 03/11/2018    Jule Ser, PT 02/01/2021, 8:43 PM  [facility] @ [address] [place], [place], [address] Phone: [phone]   Fax:   [phone]  Name: Kristy Trujillo MRN: [id number] Date of Birth: 02/04/1951

## 2021-02-10 ENCOUNTER — Ambulatory Visit (INDEPENDENT_AMBULATORY_CARE_PROVIDER_SITE_OTHER): Payer: Medicare (Managed Care) | Admitting: Family Medicine

## 2021-02-10 ENCOUNTER — Other Ambulatory Visit: Payer: Self-pay

## 2021-02-10 ENCOUNTER — Encounter: Payer: Self-pay | Admitting: Family Medicine

## 2021-02-10 VITALS — BP 178/84 | HR 85 | Temp 97.6°F | Ht 63.0 in | Wt 150.6 lb

## 2021-02-10 DIAGNOSIS — N184 Chronic kidney disease, stage 4 (severe): Secondary | ICD-10-CM | POA: Insufficient documentation

## 2021-02-10 DIAGNOSIS — N183 Chronic kidney disease, stage 3 unspecified: Secondary | ICD-10-CM | POA: Insufficient documentation

## 2021-02-10 DIAGNOSIS — E119 Type 2 diabetes mellitus without complications: Secondary | ICD-10-CM | POA: Diagnosis not present

## 2021-02-10 DIAGNOSIS — R7989 Other specified abnormal findings of blood chemistry: Secondary | ICD-10-CM

## 2021-02-10 DIAGNOSIS — I10 Essential (primary) hypertension: Secondary | ICD-10-CM

## 2021-02-10 MED ORDER — RYBELSUS 3 MG PO TABS: 3.0000 mg | ORAL_TABLET | Freq: Every day | ORAL | 5 refills | Status: DC

## 2021-02-10 NOTE — Progress Notes (Signed)
° °  Kristy Trujillo is a 70 y.o. female who presents today for an office visit.  Assessment/Plan:  Chronic Problems Addressed Today: Diabetes mellitus without complication ([place]) No prior diagnosis.  We discussed treatment plan.  She wishes to avoid metformin.  Given her recent elevated serum creatinine I think this would be reasonable.  She does not want any injectable medications at this point.  We will start Rybelsus 3 mg daily.  Discussed potential side effects.  She will follow-up with me in about a month or so.  We will need to recheck A1c in about 3 months.  Also place referral for her to see diabetes educator.  Discussed lifestyle modifications.  Elevated serum creatinine Baseline unknown however labs from a couple months ago potentially were consistent with dehydration with her BUN:Cr more than 20 .  She is on HCTZ 25 mg daily as was started by GYN.  We will recheck labs today.  If creatinine still elevated may consider switching to alternative antihypertensive.  Discussed importance of good oral hydration.  Essential hypertension Above goal though typically well controlled.  She will monitor at home and let me know if persistently elevated.  Due to her leg edema would likely switch to ACE/ARB or spironolactone if needed instead of amlodipine.     Subjective:  HPI:  She is here to discuss about her high blood sugar. Had labs done which showed high A1c. She would like to start treatment for this issue. She notes she is not eating a lot of sugar. She drinks sugar free drinks. She has been eating crackers recently. She has no family PMhx of diabetes mellitus. She would like to start on Rybelsus to help control her blood sugar. Also having some numbness in the feet.    Her blood pressure at office was elevated today. She has not been measuring her blood pressure at home.  She has had some nightmare and thinks this could be contributing. She is still having some leg edema. She thinks HCTZ  25 mg has not been helping. This is manageable.         Objective:  Physical Exam: BP (!) 178/84 (BP Location: Right Arm) Comment (BP Location): manual   Pulse 85    Temp 97.6 F (36.4 C) (Temporal)    Ht 5\' 3"  (1.6 m)    Wt 150 lb 9.6 oz (68.3 kg)    SpO2 99%    BMI 26.68 kg/m   Gen: No acute distress, resting comfortably CV: Regular rate and rhythm with no murmurs appreciated Pulm: Normal work of breathing, clear to auscultation bilaterally with no crackles, wheezes, or rhonchi Neuro: Grossly normal, moves all extremities Psych: Normal affect and thought content       I,[name],acting as a scribe for Dimas Chyle, MD.,have documented all relevant documentation on the behalf of Dimas Chyle, MD,as directed by  Dimas Chyle, MD while in the presence of Dimas Chyle, MD.   I, Dimas Chyle, MD, have reviewed all documentation for this visit. The documentation on 02/10/21 for the exam, diagnosis, procedures, and orders are all accurate and complete.  Algis Greenhouse. Jerline Pain, MD 02/10/2021 3:13 PM

## 2021-02-10 NOTE — Assessment & Plan Note (Signed)
Above goal though typically well controlled.  She will monitor at home and let me know if persistently elevated.  Due to her leg edema would likely switch to ACE/ARB or spironolactone if needed instead of amlodipine.

## 2021-02-10 NOTE — Assessment & Plan Note (Addendum)
Baseline unknown however labs from a couple months ago potentially were consistent with dehydration with her BUN:Cr more than 20 .  She is on HCTZ 25 mg daily as was started by GYN.  We will recheck labs today.  If creatinine still elevated may consider switching to alternative antihypertensive.  Discussed importance of good oral hydration.

## 2021-02-10 NOTE — Assessment & Plan Note (Signed)
No prior diagnosis.  We discussed treatment plan.  She wishes to avoid metformin.  Given her recent elevated serum creatinine I think this would be reasonable.  She does not want any injectable medications at this point.  We will start Rybelsus 3 mg daily.  Discussed potential side effects.  She will follow-up with me in about a month or so.  We will need to recheck A1c in about 3 months.  Also place referral for her to see diabetes educator.  Discussed lifestyle modifications.

## 2021-02-10 NOTE — Patient Instructions (Signed)
It was very nice to see you today!  Your A1c is elevated into the diabetic range.  Please start Rybelsus.  Please let me know if you have any significant side effects.  Please keep an eye on your blood pressure and let me know if it is persistently elevated.  We will check blood work today.  Please come back in 1 month.  Come back sooner if needed.  Take care, Dr Jerline Pain  PLEASE NOTE:  If you had any lab tests please let us know if you have not heard back within a few days. You may see your results on mychart before we have a chance to review them but we will give you a call once they are reviewed by [place]. If we ordered any referrals today, please let us know if you have not heard from their office within the next week.   Please try these tips to maintain a healthy lifestyle:  Eat at least 3 REAL meals and 1-2 snacks per day.  Aim for no more than 5 hours between eating.  If you eat breakfast, please do so within one hour of getting up.   Each meal should contain half fruits/vegetables, one quarter protein, and one quarter carbs (no bigger than a computer mouse)  Cut down on sweet beverages. This includes juice, soda, and sweet tea.   Drink at least 1 glass of water with each meal and aim for at least 8 glasses per day  Exercise at least 150 minutes every week.

## 2021-02-11 LAB — COMPREHENSIVE METABOLIC PANEL
ALT: 13 U/L (ref 0–35)
AST: 20 U/L (ref 0–37)
Albumin: 3.9 g/dL (ref 3.5–5.2)
Alkaline Phosphatase: 102 U/L (ref 39–117)
BUN: 41 mg/dL — ABNORMAL HIGH (ref 6–23)
CO2: 23 mEq/L (ref 19–32)
Calcium: 9.4 mg/dL (ref 8.4–10.5)
Chloride: 103 mEq/L (ref 96–112)
Creatinine, Ser: 1.25 mg/dL — ABNORMAL HIGH (ref 0.40–1.20)
GFR: 43.35 mL/min — ABNORMAL LOW (ref >60.00)
Glucose, Bld: 236 mg/dL — ABNORMAL HIGH (ref 70–99)
Potassium: 5.1 mEq/L (ref 3.5–5.1)
Sodium: 136 mEq/L (ref 135–145)
Total Bilirubin: 0.4 mg/dL (ref 0.2–1.2)
Total Protein: 7.5 g/dL (ref 6.0–8.3)

## 2021-02-11 LAB — CBC
HCT: 36.6 % (ref 36.0–46.0)
Hemoglobin: 12 g/dL (ref 12.0–15.0)
MCHC: 32.8 g/dL (ref 30.0–36.0)
MCV: 85.6 fl (ref 78.0–100.0)
Platelets: 323 10*3/uL (ref 150.0–400.0)
RBC: 4.27 Mil/uL (ref 3.87–5.11)
RDW: 13.3 % (ref 11.5–15.5)
WBC: 6.7 10*3/uL (ref 4.0–10.5)

## 2021-02-11 NOTE — Progress Notes (Signed)
Please inform patient of the following:  Blood sugar is still high but improving.  Her kidney numbers look better but are still mildly elevated.  It looks like she is probably dehydrated.  Would like for her to make sure that she is getting plenty of fluids and we can discuss her checking at her next office visit.

## 2021-02-17 ENCOUNTER — Telehealth: Payer: Self-pay | Admitting: Family Medicine

## 2021-02-17 NOTE — Telephone Encounter (Signed)
pt returned phone call regarding labs. please contact pt.

## 2021-02-17 NOTE — Telephone Encounter (Signed)
See result note.  

## 2021-02-22 ENCOUNTER — Other Ambulatory Visit: Payer: Self-pay | Admitting: Family Medicine

## 2021-02-22 ENCOUNTER — Other Ambulatory Visit: Payer: Self-pay | Admitting: Physician Assistant

## 2021-03-03 ENCOUNTER — Encounter: Payer: Self-pay | Admitting: Physical Therapy

## 2021-03-03 ENCOUNTER — Other Ambulatory Visit: Payer: Self-pay

## 2021-03-03 ENCOUNTER — Ambulatory Visit: Payer: No Typology Code available for payment source | Attending: Family Medicine | Admitting: Physical Therapy

## 2021-03-03 DIAGNOSIS — M6281 Muscle weakness (generalized): Secondary | ICD-10-CM | POA: Insufficient documentation

## 2021-03-03 DIAGNOSIS — R279 Unspecified lack of coordination: Secondary | ICD-10-CM | POA: Insufficient documentation

## 2021-03-03 NOTE — Patient Instructions (Addendum)
Moisturizers They are used in the vagina to hydrate the mucous membrane that make up the vaginal canal. Designed to keep a more normal acid balance (ph) Once placed in the vagina, it will last between two to three days.  Use 2-3 times per week at bedtime  Ingredients to avoid is glycerin and fragrance, can increase chance of infection Should not be used just before sex due to causing irritation Most are gels administered either in a tampon-shaped applicator or as a vaginal suppository. They are non-hormonal.   Types of Moisturizers(internal use)  Vitamin E vaginal suppositories- Whole foods, Amazon Moist Again Coconut oil- can break down condoms Julva- (Do no use if on Tamoxifen) amazon Yes moisturizer- amazon NeuEve Silk , NeuEve Silver for menopausal or over 65 (if have severe vaginal atrophy or cancer treatments use NeuEve Silk for  1 month than move to [employer])- [employer], [place].com Olive and Bee intimate cream- www.oliveandbee.com.au Mae vaginal moisturizer- Amazon Aloe    Creams to use externally on the Vulva area [employer]'s (good for for cancer patients that had radiation to the area)- [place] or [employer].FlyingBasics.com.br V-magic cream - amazon Julva-amazon Vital "V [place] salve ( help moisturize and help with thinning vulvar area, does have [place] by Irwin Brakeman labial moisturizer (Amazon,  Coconut or olive oil aloe   Things to avoid in the vaginal area Do not use things to irritate the vulvar area No lotions just specialized creams for the vulva area- Neogyn, V-magic, No soaps; can use Aveeno or Calendula cleanser if needed. Must be gentle No deodorants No douches Good to sleep without underwear to let the vaginal area to air out No scrubbing: spread the lips to let warm water rinse over labias and pat dry  Access Code: [email or web address] URL: [email or web address] Date:  03/03/2021 Prepared by: Jari Favre  Exercises Seated Hamstring Stretch - 1 x daily - 7 x weekly - 1 sets - 3 reps - 30 sec hold Seated Piriformis Stretch with Trunk Bend - 1 x daily - 7 x weekly - 1 sets - 3 reps - 30 sec hold Seated Flexion Stretch - 1 x daily - 7 x weekly - 3 sets - 10 reps - 5 hold

## 2021-03-03 NOTE — Therapy (Signed)
[facility] @ [address] [place], [place], [address] Phone: [phone]   Fax:  [phone]  Physical Therapy Treatment  Patient Details  Name: Kristy Trujillo MRN: [id number] Date of Birth: 1950/07/19 Referring Provider (PT): Vivi Barrack, MD   Encounter Date: 03/03/2021   PT End of Session - 03/03/21 1606     Visit Number 2    Date for PT Re-Evaluation 04/26/21    Authorization Type [employer] -[place]    PT Start Time 1534    PT Stop Time 1614    PT Time Calculation (min) 40 min    Activity Tolerance Patient tolerated treatment well    Behavior During Therapy [facility] for tasks assessed/performed             Past Medical History:  Diagnosis Date   Gallstones    Shingles     Past Surgical History:  Procedure Laterality Date   CHOLECYSTECTOMY      There were no vitals filed for this visit.   Subjective Assessment - 03/03/21 1624     Subjective Pt reports no change since eval.    Currently in Pain? No/denies                               [facility] Adult PT Treatment/Exercise - 03/03/21 0001       Self-Care   Self-Care Other Self-Care Comments    Other Self-Care Comments  Pt education performed on use of coconut oil for moisturization.      Neuro Re-ed    Neuro Re-ed Details  Diaphragmatic breathing for PFM relaxation; Liborio Negron Torres training with breath coordination      Exercises   Exercises Other Exercises;Lumbar    Other Exercises  Seated HS stretch 1 x 30 sec bil; seated piriformis stretch 1 x 30 sec bil; seated forward fold 3 x 3 breaths      Manual Therapy   Manual Therapy Internal Pelvic Floor    Manual therapy comments Myofascial release around bladder with external release in addition; increased tension of Lt OI and Rt LA    Internal Pelvic Floor Verbal consent provided by patient                     PT Education - 03/03/21 1558     Education Details Pt education  performed on coconut oil application for vulvovaginal moisturization; [email or web address]    Person(s) Educated Patient    Methods Explanation;Demonstration;Tactile cues;Verbal cues;Handout    Comprehension Verbalized understanding;Returned demonstration              PT Short Term Goals - 02/01/21 2039       PT SHORT TERM GOAL #1   Title ind with initial HEP    Time 4    Period Weeks    Status New    Target Date 03/01/21      PT SHORT TERM GOAL #2   Title 5x STS improved to less than 18 seconds    Baseline 22 seconds    Time 4    Period Weeks    Status New    Target Date 03/01/21               PT Long Term Goals - 02/01/21 2039       PT LONG TERM GOAL #1   Title ind with advanced HEP    Time 12  Period Weeks    Status New    Target Date 04/26/21      PT LONG TERM GOAL #2   Title Pt will report no fecal leakage for at least 2 weeks    Time 12    Period Weeks    Status New    Target Date 04/26/21      PT LONG TERM GOAL #3   Title Pt is down to 1-2 small pads/day    Baseline 4 extra large pads    Time 12    Period Weeks    Status New    Target Date 04/26/21      PT LONG TERM GOAL #4   Title Pt will demonstrate 5x sit to stand in <12 seconds for reduced risk of falls    Time 12    Period Weeks    Status New    Target Date 04/26/21                   Plan - 03/03/21 1620     Clinical Impression Statement Pt had no goals met today due to intial treatment.  Pt has difficulty relaxing pelvic floor after she contracts the muscles.  Pt was able to engage the muscles much better after cues to relax and to blow while contracting.  Pt at this time was given stretches and breathing exercises to improve muscle length.  She was also very dry around the vulva and had been using toilet paper and pads which may be drying the skin out.  Pt will benefit from skilled PT to address strength and coordination of pelvic floor.    PT Treatment/Interventions ADLs/Self Care  Home Management;Biofeedback;Cryotherapy;Electrical Stimulation;Moist Heat;Therapeutic activities;Neuromuscular re-education;Therapeutic exercise;Patient/family education;Manual techniques;Passive range of motion;Dry needling;Taping    PT Next Visit Plan Continue to work on diaphragmatic breathing, PF bulge, and isolating kegel; follow-up on coconut oil.    PT Home Exercise Plan [email or web address]    Consulted and Agree with Plan of Care Patient             Patient will benefit from skilled therapeutic intervention in order to improve the following deficits and impairments:  Difficulty walking, Decreased coordination, Decreased strength, Decreased range of motion, Increased fascial restricitons  Visit Diagnosis: Muscle weakness (generalized)  Unspecified lack of coordination     Problem List Patient Active Problem List   Diagnosis Date Noted   Elevated serum creatinine 02/10/2021   Diabetes mellitus without complication ([place]) 34/19/6222   Essential hypertension 12/09/2020   Tinnitus of both ears 12/09/2020   Leg edema 11/23/2020   OAB (overactive bladder) 08/12/2020   Lipoma 03/11/2018   Depression, major, in remission ([place]) 03/11/2018   Anxiety 03/11/2018    Jule Ser, PT 03/03/2021, 5:06 PM  [facility] @ [place], [place], [address] Phone: [phone]   Fax:  [phone]  Name: Kristy Trujillo MRN: [id number] Date of Birth: 1950-09-03

## 2021-03-10 ENCOUNTER — Encounter: Payer: Medicare (Managed Care) | Admitting: Physical Therapy

## 2021-03-15 ENCOUNTER — Other Ambulatory Visit: Payer: Self-pay

## 2021-03-15 ENCOUNTER — Ambulatory Visit (INDEPENDENT_AMBULATORY_CARE_PROVIDER_SITE_OTHER): Payer: No Typology Code available for payment source | Admitting: Family Medicine

## 2021-03-15 ENCOUNTER — Encounter: Payer: Self-pay | Admitting: Family Medicine

## 2021-03-15 VITALS — BP 130/82 | HR 90 | Temp 98.0°F | Ht 62.0 in | Wt 146.4 lb

## 2021-03-15 DIAGNOSIS — R7989 Other specified abnormal findings of blood chemistry: Secondary | ICD-10-CM

## 2021-03-15 DIAGNOSIS — I10 Essential (primary) hypertension: Secondary | ICD-10-CM

## 2021-03-15 DIAGNOSIS — E119 Type 2 diabetes mellitus without complications: Secondary | ICD-10-CM

## 2021-03-15 MED ORDER — RYBELSUS 3 MG PO TABS: 3.0000 mg | ORAL_TABLET | Freq: Every day | ORAL | 5 refills | Status: DC

## 2021-03-15 NOTE — Assessment & Plan Note (Signed)
Much better today.  At goal on HCTZ 25 mg daily.

## 2021-03-15 NOTE — Patient Instructions (Signed)
It was very nice to see you today!  We will continue your current medications.  Come back in 2-3 months.   Please come back sooner if needed.  Take care, Dr Jerline Pain  PLEASE NOTE:  If you had any lab tests please let us know if you have not heard back within a few days. You may see your results on mychart before we have a chance to review them but we will give you a call once they are reviewed by [place]. If we ordered any referrals today, please let us know if you have not heard from their office within the next week.   Please try these tips to maintain a healthy lifestyle:  Eat at least 3 REAL meals and 1-2 snacks per day.  Aim for no more than 5 hours between eating.  If you eat breakfast, please do so within one hour of getting up.   Each meal should contain half fruits/vegetables, one quarter protein, and one quarter carbs (no bigger than a computer mouse)  Cut down on sweet beverages. This includes juice, soda, and sweet tea.   Drink at least 1 glass of water with each meal and aim for at least 8 glasses per day  Exercise at least 150 minutes every week.

## 2021-03-15 NOTE — Assessment & Plan Note (Signed)
She had some side effects with Rybelsus however is now doing well.  We will continue 3 mg daily.  Follow-up in 2 to 3 months to recheck A1c.

## 2021-03-15 NOTE — Progress Notes (Signed)
° °  Kristy Trujillo is a 71 y.o. female who presents today for an office visit.  Assessment/Plan:  Chronic Problems Addressed Today: Diabetes mellitus without complication ([place]) She had some side effects with Rybelsus however is now doing well.  We will continue 3 mg daily.  Follow-up in 2 to 3 months to recheck A1c.  Elevated serum creatinine Trending down on last check.  We can recheck again in a couple of months.  Encourage good oral hydration.  Essential hypertension Much better today.  At goal on HCTZ 25 mg daily.     Subjective:  HPI:  She is here to follow up on hypertension. We last saw her in the office on 02/10/2021. Her blood pressure has been stable at office today. She admit to not checking her blood pressure at home. No headaches, fatigue or fever.  She was started on Rybelsus 3 mg daily in the last visit for diabetes. Had some diarrhea but symptoms were resolved. She is compliant with her medication. No other side effects. No reported fever or chills.        Objective:  Physical Exam: BP 130/82    Pulse 90    Temp 98 F (36.7 C) (Temporal)    Ht 5\' 2"  (1.575 m)    Wt 146 lb 6.4 oz (66.4 kg)    SpO2 98%    BMI 26.78 kg/m   Gen: No acute distress, resting comfortably Neuro: Grossly normal, moves all extremities Psych: Normal affect and thought content       I,[name],acting as a scribe for Dimas Chyle, MD.,have documented all relevant documentation on the behalf of Dimas Chyle, MD,as directed by  Dimas Chyle, MD while in the presence of Dimas Chyle, MD.   I, Dimas Chyle, MD, have reviewed all documentation for this visit. The documentation on 03/15/21 for the exam, diagnosis, procedures, and orders are all accurate and complete.   Algis Greenhouse. Jerline Pain, MD 03/15/2021 2:40 PM

## 2021-03-15 NOTE — Assessment & Plan Note (Signed)
Trending down on last check.  We can recheck again in a couple of months.  Encourage good oral hydration.

## 2021-03-17 ENCOUNTER — Encounter: Payer: Medicare (Managed Care) | Admitting: Physical Therapy

## 2021-03-24 ENCOUNTER — Encounter: Payer: Medicare (Managed Care) | Admitting: Physical Therapy

## 2021-03-31 ENCOUNTER — Encounter: Payer: Medicare (Managed Care) | Admitting: Physical Therapy

## 2021-04-17 ENCOUNTER — Other Ambulatory Visit: Payer: Self-pay | Admitting: Family Medicine

## 2021-05-16 ENCOUNTER — Encounter: Payer: Self-pay | Admitting: Physical Therapy

## 2021-05-16 ENCOUNTER — Other Ambulatory Visit: Payer: Self-pay

## 2021-05-16 ENCOUNTER — Ambulatory Visit: Payer: No Typology Code available for payment source | Attending: Family Medicine | Admitting: Physical Therapy

## 2021-05-16 DIAGNOSIS — R279 Unspecified lack of coordination: Secondary | ICD-10-CM | POA: Insufficient documentation

## 2021-05-16 DIAGNOSIS — M6281 Muscle weakness (generalized): Secondary | ICD-10-CM | POA: Diagnosis not present

## 2021-05-16 NOTE — Therapy (Signed)
?[facility] ?[place], [place], [address] ?Phone: [phone]   Fax:  [phone] ? ?Physical Therapy Treatment ? ?Patient Details  ?Name: Kristy Trujillo ?MRN: [id number] ?Date of Birth: 19-Sep-1950 ?Referring Provider (PT): Vivi Barrack, MD ? ? ?Encounter Date: 05/16/2021 ? ? PT End of Session - 05/16/21 1408   ? ? Visit Number 3   ? Date for PT Re-Evaluation 08/08/21   ? Authorization Type devoted health -[place]   ? PT Start Time 1400   ? PT Stop Time 1440   ? PT Time Calculation (min) 40 min   ? Activity Tolerance Patient tolerated treatment well   ? Behavior During Therapy [facility] for tasks assessed/performed   ? ?  ?  ? ?  ? ? ?Past Medical History:  ?Diagnosis Date  ? Gallstones   ? Shingles   ? ? ?Past Surgical History:  ?Procedure Laterality Date  ? CHOLECYSTECTOMY    ? ? ?There were no vitals filed for this visit. ? ? Subjective Assessment - 05/16/21 1406   ? ? Subjective I got sick from being on a medicine. I have had diarrhea and vomiting when on a medicine.  I stopped taking the medicine and I am better but had diarrhea last night and didn't make it to the bathroom.  Usually the bladder is the worse issue.   ? Patient Stated Goals getting control of the bladder and bowels   ? Currently in Pain? No/denies   ? ?  ?  ? ?  ? ? ? ? ? ? ? ? ? ? ? ? ? ? ? ? ? Pelvic Floor Special Questions - 05/16/21 0001   ? ? Pad use up to 4 large   ? Activities that cause leaking --   any time  ? Pelvic Floor Internal Exam pt identity confirmed and internal assessment with consent   ? Exam Type Vaginal   ? Palpation Tight on Rt>Lt; ; difficulty relaxing after contracting the muscles   ? Strength fair squeeze, definite lift   ? Strength # of seconds 3   ? ?  ?  ? ?  ? ? ? ? [facility] Adult PT Treatment/Exercise - 05/16/21 0001   ? ?  ? Neuro Re-ed   ? Neuro Re-ed Details  Diaphragmatic breathing for PFM relaxation; [facility] training with breath coordination   ?  ? Lumbar  Exercises: Stretches  ? Figure 4 Stretch 1 rep;60 seconds;Supine;With overpressure   ? Other Lumbar Stretch Exercise hip and lumbar rotation; thoracic rotation with hands clasped   ?  ? Lumbar Exercises: Supine  ? Other Supine Lumbar Exercises kegel with ball between knees and hip IR - TC - exhale with exertion 10x   ?  ? Manual Therapy  ? Manual Therapy Internal Pelvic Floor   ? Internal Pelvic Floor Verbal consent provided by patient; stretch to levators bil; breathing and bulging with stretches   ? ?  ?  ? ?  ? ? ? ? ? ? ? ? ? ? PT Education - 05/16/21 1454   ? ? Education Details Access Code: [email or web address]   ? Person(s) Educated Patient   ? Methods Explanation;Demonstration;Tactile cues;Handout;Verbal cues   ? Comprehension Returned demonstration;Verbalized understanding   ? ?  ?  ? ?  ? ? ? PT Short Term Goals - 02/01/21 2039   ? ?  ? PT SHORT TERM GOAL #1  ? Title ind with initial HEP   ?  Time 4   ? Period Weeks   ? Status New   ? Target Date 03/01/21   ?  ? PT SHORT TERM GOAL #2  ? Title 5x STS improved to less than 18 seconds   ? Baseline 22 seconds   ? Time 4   ? Period Weeks   ? Status New   ? Target Date 03/01/21   ? ?  ?  ? ?  ? ? ? ? PT Long Term Goals - 05/16/21 1508   ? ?  ? PT LONG TERM GOAL #1  ? Title ind with advanced HEP   ? Time 12   ? Period Weeks   ? Status On-going   ? Target Date 08/08/21   ?  ? PT LONG TERM GOAL #2  ? Title Pt will report no fecal leakage for at least 2 weeks   ? Time 12   ? Period Weeks   ? Status On-going   ? Target Date 08/08/21   ?  ? PT LONG TERM GOAL #3  ? Title Pt is down to 1-2 small pads/day   ? Baseline 4 extra large pads   ? Time 12   ? Period Weeks   ? Status On-going   ? Target Date 08/08/21   ?  ? PT LONG TERM GOAL #4  ? Title Pt will demonstrate 5x sit to stand in <12 seconds for reduced risk of falls   ? Time 12   ? Period Weeks   ? Status On-going   ? Target Date 08/08/21   ? ?  ?  ? ?  ? ? ? ? ? ? ? ? Plan - 05/16/21 1508   ? ? Clinical Impression Statement Pt  here after putting PT on hold for financial reasons.  Pt is ind with initial HEP.  She has a little more strength with contracting pelvic floor.  It is more clear that she has high tone that is on the Rt>Lt.  pt goals were updated and today's session was addressing improved breathing techniques and working on bulging the pelvic floor.  She also added kegel with hip IR to focus on anterior pelvic floor strength.  pt will benefit from skilled PT for at least 2 more visits as she is only able to come 1x/month.   ? PT Treatment/Interventions ADLs/Self Care Home Management;Biofeedback;Cryotherapy;Electrical Stimulation;Moist Heat;Therapeutic activities;Neuromuscular re-education;Therapeutic exercise;Patient/family education;Manual techniques;Passive range of motion;Dry needling;Taping   ? PT Next Visit Plan progress kegel and f/u on stretching, thoracic extension and rotation   ? PT Home Exercise Plan [email or web address]   ? Consulted and Agree with Plan of Care Patient   ? ?  ?  ? ?  ? ? ?Patient will benefit from skilled therapeutic intervention in order to improve the following deficits and impairments:  Difficulty walking, Decreased coordination, Decreased strength, Decreased range of motion, Increased fascial restricitons ? ?Visit Diagnosis: ?Muscle weakness (generalized) ? ?Unspecified lack of coordination ? ? ? ? ?Problem List ?Patient Active Problem List  ? Diagnosis Date Noted  ? Elevated serum creatinine 02/10/2021  ? Diabetes mellitus without complication ([place]) 05/39/7673  ? Essential hypertension 12/09/2020  ? Tinnitus of both ears 12/09/2020  ? Leg edema 11/23/2020  ? OAB (overactive bladder) 08/12/2020  ? Lipoma 03/11/2018  ? Depression, major, in remission ([place]) 03/11/2018  ? Anxiety 03/11/2018  ? ? ?Jule Ser, PT ?05/16/2021, 3:16 PM ? ?[facility] ?[facility] ?[place],  [place], [address] ?Phone: [phone]   Fax:  [phone] ? ?Name: Kristy Trujillo ?MRN: [id number] ?Date of Birth: Dec 20, 1950 ? ? ? ?

## 2021-05-16 NOTE — Patient Instructions (Signed)
Access Code: [email or web address] ?URL: [email or web address] ?Date: 05/16/2021 ?Prepared by: Jari Favre ? ?Exercises ?Seated Hamstring Stretch - 1 x daily - 7 x weekly - 1 sets - 3 reps - 30 sec hold ?Seated Piriformis Stretch with Trunk Bend - 1 x daily - 7 x weekly - 1 sets - 3 reps - 30 sec hold ?Seated Flexion Stretch - 1 x daily - 7 x weekly - 3 sets - 10 reps - 5 hold ?Supine Figure 4 Piriformis Stretch - 1 x daily - 7 x weekly - 1 sets - 3 reps - 30 sec hold ?Supine Hip Internal and External Rotation - 1 x daily - 7 x weekly - 1 sets - 10 reps - 5 sec hold ?Supine Pelvic Floor Contraction - 3 x daily - 7 x weekly - 1 sets - 10 reps - 3 sec hold ? ?

## 2021-05-17 ENCOUNTER — Encounter: Payer: Self-pay | Admitting: Family Medicine

## 2021-05-17 ENCOUNTER — Ambulatory Visit (INDEPENDENT_AMBULATORY_CARE_PROVIDER_SITE_OTHER): Payer: No Typology Code available for payment source | Admitting: Family Medicine

## 2021-05-17 VITALS — BP 184/90 | HR 75 | Temp 98.1°F | Ht 62.0 in | Wt 153.0 lb

## 2021-05-17 DIAGNOSIS — I10 Essential (primary) hypertension: Secondary | ICD-10-CM | POA: Diagnosis not present

## 2021-05-17 DIAGNOSIS — F419 Anxiety disorder, unspecified: Secondary | ICD-10-CM | POA: Diagnosis not present

## 2021-05-17 DIAGNOSIS — E119 Type 2 diabetes mellitus without complications: Secondary | ICD-10-CM | POA: Diagnosis not present

## 2021-05-17 LAB — POCT GLYCOSYLATED HEMOGLOBIN (HGB A1C): Hemoglobin A1C: 7.2 % — AB (ref 4.0–5.6)

## 2021-05-17 MED ORDER — CLONAZEPAM 1 MG PO TABS: 1.0000 mg | ORAL_TABLET | Freq: Two times a day (BID) | ORAL | 0 refills | Status: DC | PRN

## 2021-05-17 NOTE — Assessment & Plan Note (Signed)
Elevated today.  Typically well controlled.  Continue home monitoring goal 140/90 or lower.  Continue HCTZ 25 mg daily.  She will let me know if persistently elevated. ?

## 2021-05-17 NOTE — Progress Notes (Signed)
? ?  Kristy Trujillo is a 71 y.o. female who presents today for an office visit. ? ?Assessment/Plan:  ?Chronic Problems Addressed Today: ?Diabetes mellitus without complication ([place]) ?She is no longer on Rybelsus due to side effects.  A1c today stable at 7.2.  She will stay off Rybelsus.  We can recheck again in 3 to 6 months. ? ?Essential hypertension ?Elevated today.  Typically well controlled.  Continue home monitoring goal 140/90 or lower.  Continue HCTZ 25 mg daily.  She will let me know if persistently elevated. ? ?Anxiety ?Stable on Prozac 40 mg twice daily and clonazepam 1 mg daily as needed. ? ?  ?Subjective:  ?HPI: ? ?Patient here to 2 months follow up.  ? ?Her A1c was 7.2 in the office today. She was taking Rybelsus 3 mg daily for diabetes mellitus. This has worked well to help control her blood sugar in the past. However, has had some diarrhea and vomiting with it. She has stopped taking Rybelsus about 2 months ago. She has been trying to cut down on sugar. This seems to be working for her. No reported fever or chills.  ? ?Her blood pressure at office was elevated today. She is currently on HCTZ 25 mg daily. She has been taking her medication as prescribed. She is tolerating her medication. No side effects. No headache.  ? ?   ?  ?Objective:  ?Physical Exam: ?BP (!) 184/90 (BP Location: Left Arm)   Pulse 75   Temp 98.1 ?F (36.7 ?C) (Temporal)   Ht '5\' 2"'$  (1.575 m)   Wt 153 lb (69.4 kg)   SpO2 100%   BMI 27.98 kg/m?   ?Gen: No acute distress, resting comfortably ?CV: Regular rate and rhythm with no murmurs appreciated ?Pulm: Normal work of breathing, clear to auscultation bilaterally with no crackles, wheezes, or rhonchi ?Neuro: Grossly normal, moves all extremities ?Psych: Normal affect and thought content ? ?   ? ? ?I,[name],acting as a Education administrator for Dimas Chyle, MD.,have documented all relevant documentation on the behalf of Dimas Chyle, MD,as directed by  Dimas Chyle, MD while in the  presence of Dimas Chyle, MD.  ? ?I, Dimas Chyle, MD, have reviewed all documentation for this visit. The documentation on 05/17/21 for the exam, diagnosis, procedures, and orders are all accurate and complete. ?` ?Jaeceon Michelin M. Jerline Pain, MD ?05/17/2021 3:10 PM  ? ?

## 2021-05-17 NOTE — Assessment & Plan Note (Signed)
She is no longer on Rybelsus due to side effects.  A1c today stable at 7.2.  She will stay off Rybelsus.  We can recheck again in 3 to 6 months. ?

## 2021-05-17 NOTE — Patient Instructions (Signed)
It was very nice to see you today! ? ?Your A1c looks good today.  We can stay off medications. ? ?I will see back in 3 to 6 months to recheck your A1c.  Come back sooner if needed. ? ?Take care, ?Dr Jerline Pain ? ?PLEASE NOTE: ? ?If you had any lab tests please let us know if you have not heard back within a few days. You may see your results on mychart before we have a chance to review them but we will give you a call once they are reviewed by [place]. If we ordered any referrals today, please let us know if you have not heard from their office within the next week.  ? ?Please try these tips to maintain a healthy lifestyle: ? ?Eat at least 3 REAL meals and 1-2 snacks per day.  Aim for no more than 5 hours between eating.  If you eat breakfast, please do so within one hour of getting up.  ? ?Each meal should contain half fruits/vegetables, one quarter protein, and one quarter carbs (no bigger than a computer mouse) ? ?Cut down on sweet beverages. This includes juice, soda, and sweet tea.  ? ?Drink at least 1 glass of water with each meal and aim for at least 8 glasses per day ? ?Exercise at least 150 minutes every week.   ?

## 2021-05-17 NOTE — Assessment & Plan Note (Signed)
Stable on Prozac 40 mg twice daily and clonazepam 1 mg daily as needed. ?

## 2021-06-09 ENCOUNTER — Other Ambulatory Visit: Payer: Self-pay | Admitting: Family Medicine

## 2021-06-14 ENCOUNTER — Encounter: Payer: Self-pay | Admitting: Physical Therapy

## 2021-06-14 ENCOUNTER — Ambulatory Visit: Payer: No Typology Code available for payment source | Attending: Family Medicine | Admitting: Physical Therapy

## 2021-06-14 ENCOUNTER — Encounter: Payer: No Typology Code available for payment source | Admitting: Physical Therapy

## 2021-06-14 DIAGNOSIS — M6281 Muscle weakness (generalized): Secondary | ICD-10-CM | POA: Insufficient documentation

## 2021-06-14 DIAGNOSIS — R279 Unspecified lack of coordination: Secondary | ICD-10-CM | POA: Insufficient documentation

## 2021-06-14 NOTE — Therapy (Signed)
[facility] ?[facility] ?[place], [place], [address] ?Phone: [phone]   Fax:  [phone] ? ?Physical Therapy Treatment ? ?Patient Details  ?Name: Kristy Trujillo ?MRN: [id number] ?Date of Birth: 1950/05/07 ?Referring Provider (PT): Vivi Barrack, MD ? ? ?Encounter Date: 06/14/2021 ? ? PT End of Session - 06/14/21 1234   ? ? Visit Number 4   ? Date for PT Re-Evaluation 08/08/21   ? Authorization Type devoted health -[place]   ? PT Start Time 1234   ? PT Stop Time 1314   ? PT Time Calculation (min) 40 min   ? Activity Tolerance Patient tolerated treatment well   ? Behavior During Therapy [facility] for tasks assessed/performed   ? ?  ?  ? ?  ? ? ?Past Medical History:  ?Diagnosis Date  ? Gallstones   ? Shingles   ? ? ?Past Surgical History:  ?Procedure Laterality Date  ? CHOLECYSTECTOMY    ? ? ?There were no vitals filed for this visit. ? ? Subjective Assessment - 06/14/21 1236   ? ? Subjective I had 10 days of not wetting the bed and then the last 3 days I did again.  Normally I wet the bed every day.   ? Patient Stated Goals getting control of the bladder and bowels   ? Currently in Pain? No/denies   ? ?  ?  ? ?  ? ? ? ? ? [facility] PT Assessment - 06/14/21 0001   ? ?  ? Standardized Balance Assessment  ? Five times sit to stand comments  23 sec   ? ?  ?  ? ?  ? ? ? ? ? ? ? ? ? ? ? ? ? ? ? ? [facility] Adult PT Treatment/Exercise - 06/14/21 0001   ? ?  ? Lumbar Exercises: Aerobic  ? Nustep L5 (green) x 5 min using core   ?  ? Lumbar Exercises: Seated  ? Sit to Stand 10 reps;5 reps   2 sets of 5 fast; 10 with kegel slow lowering  ? Other Seated Lumbar Exercises engaged core and yellow band horizontal abduction and diagonals 10x each   ?  ? Lumbar Exercises: Supine  ? AB Set Limitations kegel with bock to do hip ER and IR - exhale with kegel 15x each   ? ?  ?  ? ?  ? ? ? ? ? ? ? ? ? ? ? ? PT Short Term Goals - 02/01/21 2039   ? ?  ? PT SHORT TERM GOAL #1  ? Title ind with initial  HEP   ? Time 4   ? Period Weeks   ? Status New   ? Target Date 03/01/21   ?  ? PT SHORT TERM GOAL #2  ? Title 5x STS improved to less than 18 seconds   ? Baseline 22 seconds   ? Time 4   ? Period Weeks   ? Status New   ? Target Date 03/01/21   ? ?  ?  ? ?  ? ? ? ? PT Long Term Goals - 05/16/21 1508   ? ?  ? PT LONG TERM GOAL #1  ? Title ind with advanced HEP   ? Time 12   ? Period Weeks   ? Status On-going   ? Target Date 08/08/21   ?  ? PT LONG TERM GOAL #2  ? Title Pt will report no fecal leakage for  at least 2 weeks   ? Time 12   ? Period Weeks   ? Status On-going   ? Target Date 08/08/21   ?  ? PT LONG TERM GOAL #3  ? Title Pt is down to 1-2 small pads/day   ? Baseline 4 extra large pads   ? Time 12   ? Period Weeks   ? Status On-going   ? Target Date 08/08/21   ?  ? PT LONG TERM GOAL #4  ? Title Pt will demonstrate 5x sit to stand in <12 seconds for reduced risk of falls   ? Time 12   ? Period Weeks   ? Status On-going   ? Target Date 08/08/21   ? ?  ?  ? ?  ? ? ? ? ? ? ? ? Plan - 06/14/21 1254   ? ? Clinical Impression Statement Pt did well with kegel isolating in supine.  Pt attempted standing and leaning on table but using lumbar muscles too much.  She was able to progress to doing sit to stand with eccentric lowering using pelvic floor muscles.  Pt had some success with intial HEP with less leakage at night , but still having  a lof leakage during the day.  Continue with POC to achieve functional goals   ? PT Treatment/Interventions ADLs/Self Care Home Management;Biofeedback;Cryotherapy;Electrical Stimulation;Moist Heat;Therapeutic activities;Neuromuscular re-education;Therapeutic exercise;Patient/family education;Manual techniques;Passive range of motion;Dry needling;Taping   ? PT Next Visit Plan kegel progressions with core and bands in standing, thoracic ext ROM and rotation strength   ? PT Home Exercise Plan [email or web address]   ? Consulted and Agree with Plan of Care Patient   ? ?  ?  ? ?  ? ? ?Patient will  benefit from skilled therapeutic intervention in order to improve the following deficits and impairments:  Difficulty walking, Decreased coordination, Decreased strength, Decreased range of motion, Increased fascial restricitons ? ?Visit Diagnosis: ?Muscle weakness (generalized) ? ?Unspecified lack of coordination ? ? ? ? ?Problem List ?Patient Active Problem List  ? Diagnosis Date Noted  ? Elevated serum creatinine 02/10/2021  ? Diabetes mellitus without complication ([place]) 77/93/9030  ? Essential hypertension 12/09/2020  ? Tinnitus of both ears 12/09/2020  ? Leg edema 11/23/2020  ? OAB (overactive bladder) 08/12/2020  ? Lipoma 03/11/2018  ? Depression, major, in remission ([place]) 03/11/2018  ? Anxiety 03/11/2018  ? ? ?Jule Ser, PT ?06/14/2021, 8:28 PM ? ?[facility] ?[facility] ?[place], [place], [address] ?Phone: [phone]   Fax:  [phone] ? ?Name: Kristy Trujillo ?MRN: [id number] ?Date of Birth: 1950/10/31 ? ? ? ?

## 2021-06-30 ENCOUNTER — Encounter: Payer: Self-pay | Admitting: Family Medicine

## 2021-06-30 ENCOUNTER — Ambulatory Visit (INDEPENDENT_AMBULATORY_CARE_PROVIDER_SITE_OTHER): Payer: No Typology Code available for payment source | Admitting: Family Medicine

## 2021-06-30 VITALS — BP 121/82 | HR 77 | Temp 98.1°F | Ht 62.0 in | Wt 152.4 lb

## 2021-06-30 DIAGNOSIS — E785 Hyperlipidemia, unspecified: Secondary | ICD-10-CM | POA: Diagnosis not present

## 2021-06-30 DIAGNOSIS — F419 Anxiety disorder, unspecified: Secondary | ICD-10-CM | POA: Diagnosis not present

## 2021-06-30 DIAGNOSIS — I10 Essential (primary) hypertension: Secondary | ICD-10-CM

## 2021-06-30 DIAGNOSIS — F325 Major depressive disorder, single episode, in full remission: Secondary | ICD-10-CM

## 2021-06-30 DIAGNOSIS — E119 Type 2 diabetes mellitus without complications: Secondary | ICD-10-CM

## 2021-06-30 DIAGNOSIS — R7989 Other specified abnormal findings of blood chemistry: Secondary | ICD-10-CM | POA: Diagnosis not present

## 2021-06-30 LAB — LIPID PANEL
Cholesterol: 187 mg/dL (ref 0–200)
HDL: 60.5 mg/dL (ref >39.00)
LDL Cholesterol: 107 mg/dL — ABNORMAL HIGH (ref 0–99)
NonHDL: 126.39
Total CHOL/HDL Ratio: 3
Triglycerides: 97 mg/dL (ref 0.0–149.0)
VLDL: 19.4 mg/dL (ref 0.0–40.0)

## 2021-06-30 LAB — COMPREHENSIVE METABOLIC PANEL
ALT: 15 U/L (ref 0–35)
AST: 20 U/L (ref 0–37)
Albumin: 3.9 g/dL (ref 3.5–5.2)
Alkaline Phosphatase: 111 U/L (ref 39–117)
BUN: 57 mg/dL — ABNORMAL HIGH (ref 6–23)
CO2: 25 mEq/L (ref 19–32)
Calcium: 8.9 mg/dL (ref 8.4–10.5)
Chloride: 101 mEq/L (ref 96–112)
Creatinine, Ser: 1.41 mg/dL — ABNORMAL HIGH (ref 0.40–1.20)
GFR: 37.42 mL/min — ABNORMAL LOW (ref >60.00)
Glucose, Bld: 199 mg/dL — ABNORMAL HIGH (ref 70–99)
Potassium: 4.5 mEq/L (ref 3.5–5.1)
Sodium: 135 mEq/L (ref 135–145)
Total Bilirubin: 0.3 mg/dL (ref 0.2–1.2)
Total Protein: 7.6 g/dL (ref 6.0–8.3)

## 2021-06-30 LAB — CBC
HCT: 35.6 % — ABNORMAL LOW (ref 36.0–46.0)
Hemoglobin: 12 g/dL (ref 12.0–15.0)
MCHC: 33.6 g/dL (ref 30.0–36.0)
MCV: 86.1 fl (ref 78.0–100.0)
Platelets: 297 10*3/uL (ref 150.0–400.0)
RBC: 4.14 Mil/uL (ref 3.87–5.11)
RDW: 13.3 % (ref 11.5–15.5)
WBC: 7.2 10*3/uL (ref 4.0–10.5)

## 2021-06-30 LAB — TSH: TSH: 1.46 u[IU]/mL (ref 0.35–5.50)

## 2021-06-30 LAB — HEMOGLOBIN A1C: Hgb A1c MFr Bld: 7.3 % — ABNORMAL HIGH (ref 4.6–6.5)

## 2021-06-30 NOTE — Assessment & Plan Note (Addendum)
Stable on Prozac 40 mg twice daily and clonazepam 1 mg daily as needed. ?

## 2021-06-30 NOTE — Patient Instructions (Signed)
It was very nice to see you today! ? ?We will check blood work today.  ? ?No medication changes.  We will see back in 3 to 6 months.  Please come back to see [place] sooner if needed. ? ?Take care, ?Dr Jerline Pain ? ?PLEASE NOTE: ? ?If you had any lab tests please let us know if you have not heard back within a few days. You may see your results on mychart before we have a chance to review them but we will give you a call once they are reviewed by [place]. If we ordered any referrals today, please let us know if you have not heard from their office within the next week.  ? ?Please try these tips to maintain a healthy lifestyle: ? ?Eat at least 3 REAL meals and 1-2 snacks per day.  Aim for no more than 5 hours between eating.  If you eat breakfast, please do so within one hour of getting up.  ? ?Each meal should contain half fruits/vegetables, one quarter protein, and one quarter carbs (no bigger than a computer mouse) ? ?Cut down on sweet beverages. This includes juice, soda, and sweet tea.  ? ?Drink at least 1 glass of water with each meal and aim for at least 8 glasses per day ? ?Exercise at least 150 minutes every week.   ?

## 2021-06-30 NOTE — Assessment & Plan Note (Signed)
Stable on Prozac 40 mg twice daily. ?

## 2021-06-30 NOTE — Assessment & Plan Note (Signed)
At goal on HCTZ 25 mg daily. ?

## 2021-06-30 NOTE — Assessment & Plan Note (Signed)
Check c-Met. 

## 2021-06-30 NOTE — Assessment & Plan Note (Signed)
Check A1c.  She is no longer on Rybelsus.  She is doing much better without any significant side effects. ?

## 2021-06-30 NOTE — Progress Notes (Signed)
? ?  Kristy Trujillo is a 71 y.o. female who presents today for an office visit. ? ?Assessment/Plan:  ?New/Acute Problems: ?Fall ?No lingering injury or pain.  She is on a couple of times which seem to be more mechanical in nature.  She is continue to work with physical therapy.  Given lack of chest pain, shortness of breath, or syncopal symptoms do not think she needs cardiac work-up at this point. ? ?Chronic Problems Addressed Today: ?Diabetes mellitus without complication ([place]) ?Check A1c.  She is no longer on Rybelsus.  She is doing much better without any significant side effects. ? ?Elevated serum creatinine ?Check c-Met. ? ?Essential hypertension ?At goal on HCTZ 25 mg daily. ? ?Anxiety ?Stable on Prozac 40 mg twice daily and clonazepam 1 mg daily as needed. ? ?Depression, major, in remission ([place]) ?Stable on Prozac 40 mg twice daily. ? ? ?  ?Subjective:  ?HPI: ? ?Patient here for follow-up.  See A/P for status of chronic conditions.  She is doing well.  She has had continued issues with weakness and balance issues.  She has fallen a couple of times over the last several months.  She has been working with physical therapy.  She says that she will lose balance and then fall backwards.  Most recently this [place] the [place] and [place] step over a curb.  Did not have any injuries.  Did not have any loss of consciousness.  No symptoms at rest. ? ?   ?  ?Objective:  ?Physical Exam: ?BP 121/82   Pulse 77   Temp 98.1 ?F (36.7 ?C) (Temporal)   Ht $R'5\' 2"'Zv$  (1.575 m)   Wt 152 lb 6.4 oz (69.1 kg)   SpO2 97%   BMI 27.87 kg/m?   ?Gen: No acute distress, resting comfortably ?CV: Regular rate and rhythm with no murmurs appreciated ?Pulm: Normal work of breathing, clear to auscultation bilaterally with no crackles, wheezes, or rhonchi ?Neuro: Grossly normal, moves all extremities ?Psych: Normal affect and thought content ? ?   ? ?Algis Greenhouse. Jerline Pain, MD ?06/30/2021 2:18 PM  ?

## 2021-07-01 ENCOUNTER — Encounter: Payer: Self-pay | Admitting: Family Medicine

## 2021-07-01 DIAGNOSIS — E1169 Type 2 diabetes mellitus with other specified complication: Secondary | ICD-10-CM | POA: Insufficient documentation

## 2021-07-01 NOTE — Progress Notes (Signed)
Please inform patient of the following: ? ?Her labs are all stable. She has age related decreased kidney function.  This is something that we should continue to monitor but do not need to make any changes to her treatment plan at this time.  Looks like she is fairly dehydrated.  She should make sure that she is getting at least 8 glasses of water daily. ? ?Her A1c is stable at 7.3.  We can recheck this again in 3 months. ? ?Her cholesterol level is borderline elevated.  She would benefit from starting cholesterol medication to improve numbers and lower risk of heart attack and stroke.  Please send in Lipitor 40 mg daily if she is willing to start.  We should recheck this again in a year.

## 2021-07-02 ENCOUNTER — Other Ambulatory Visit: Payer: Self-pay | Admitting: *Deleted

## 2021-07-02 MED ORDER — ATORVASTATIN CALCIUM 40 MG PO TABS: 40.0000 mg | ORAL_TABLET | Freq: Every day | ORAL | 3 refills | Status: DC

## 2021-07-03 ENCOUNTER — Other Ambulatory Visit: Payer: Self-pay | Admitting: Family Medicine

## 2021-07-04 ENCOUNTER — Other Ambulatory Visit: Payer: Self-pay | Admitting: *Deleted

## 2021-07-04 MED ORDER — CLONAZEPAM 1 MG PO TABS: 1.0000 mg | ORAL_TABLET | Freq: Two times a day (BID) | ORAL | 0 refills | Status: DC | PRN

## 2021-07-19 ENCOUNTER — Encounter: Payer: No Typology Code available for payment source | Admitting: Physical Therapy

## 2021-07-19 ENCOUNTER — Encounter: Payer: Self-pay | Admitting: Physical Therapy

## 2021-07-19 ENCOUNTER — Ambulatory Visit: Payer: No Typology Code available for payment source | Attending: Family Medicine | Admitting: Physical Therapy

## 2021-07-19 DIAGNOSIS — M6281 Muscle weakness (generalized): Secondary | ICD-10-CM | POA: Diagnosis not present

## 2021-07-19 DIAGNOSIS — R279 Unspecified lack of coordination: Secondary | ICD-10-CM | POA: Diagnosis not present

## 2021-07-19 NOTE — Patient Instructions (Signed)
Access Code: [email or web address] URL: [email or web address] Date: 07/19/2021 Prepared by: Jari Favre  Exercises - Seated Hamstring Stretch  - 1 x daily - 7 x weekly - 1 sets - 3 reps - 30 sec hold - Seated Piriformis Stretch with Trunk Bend  - 1 x daily - 7 x weekly - 1 sets - 3 reps - 30 sec hold - Seated Flexion Stretch  - 1 x daily - 7 x weekly - 3 sets - 10 reps - 5 hold - Supine Figure 4 Piriformis Stretch  - 1 x daily - 7 x weekly - 1 sets - 3 reps - 30 sec hold - Supine Hip Internal and External Rotation  - 1 x daily - 7 x weekly - 1 sets - 10 reps - 5 sec hold - Supine Pelvic Floor Contraction  - 2 x daily - 7 x weekly - 1 sets - 10 reps - 3 sec hold - Supine Bridge with Pelvic Floor Contraction and Hip Rotation  - 2 x daily - 7 x weekly - 1 sets - 10 reps - 3 sec hold - Sit to Stand with Pelvic Floor Contraction  - 2 x daily - 7 x weekly - 1 sets - 5 reps - Sit to Stand  - 3 x daily - 7 x weekly - 1 sets - 5 reps - Heel Raises with Unilateral Counter Support  - 2 x daily - 7 x weekly - 1 sets - 10 reps - Hooklying Isometric Clamshell  - 1 x daily - 7 x weekly - 2 sets - 10 reps - Clamshell  - 1 x daily - 7 x weekly - 2 sets - 10 reps

## 2021-07-19 NOTE — Therapy (Signed)
[facility] @ [address] [place] [place], [place], [address] Phone: [phone]   Fax:  [phone]  Physical Therapy Treatment  Patient Details  Name: Kristy Trujillo MRN: [id number] Date of Birth: Jul 01, 1950 Referring Provider (PT): Vivi Barrack, MD   Encounter Date: 07/19/2021   PT End of Session - 07/19/21 1348     Visit Number 5    Date for PT Re-Evaluation 11/08/21    Authorization Type [employer] health -[place]    PT Start Time 1359    PT Stop Time 1440    PT Time Calculation (min) 41 min    Activity Tolerance Patient tolerated treatment well    Behavior During Therapy [facility] for tasks assessed/performed             Past Medical History:  Diagnosis Date   Gallstones    Shingles     Past Surgical History:  Procedure Laterality Date   CHOLECYSTECTOMY      There were no vitals filed for this visit.   Subjective Assessment - 07/19/21 1404     Subjective I wear a pad all the time and leaking during the day and at night every day.  I wet the bed 2x this week where it went through the pad.  I had a lot of fecal leakage 2 days ago    Patient Stated Goals getting control of the bladder and bowels    Currently in Pain? No/denies                [facility] PT Assessment - 07/19/21 0001       Standardized Balance Assessment   Standardized Balance Assessment Five Times Sit to Stand    Five times sit to stand comments  33 sec                           [facility] Adult PT Treatment/Exercise - 07/19/21 0001       Lumbar Exercises: Standing   Other Standing Lumbar Exercises calf raise with UE reach - 10x each side      Lumbar Exercises: Seated   Sit to Stand 10 reps;5 reps   2 sets of 5 fast; 10 with kegel slow lowering     Lumbar Exercises: Supine   Clam 20 reps   red   Bent Knee Raise 20 reps    Bridge 20 reps                     PT Education - 07/19/21 1728     Education Details Access  Code: [email or web address]    Person(s) Educated Patient    Methods Explanation;Demonstration;Tactile cues;Verbal cues;Handout    Comprehension Verbalized understanding;Returned demonstration              PT Short Term Goals - 07/19/21 1413       PT SHORT TERM GOAL #1   Title ind with initial HEP    Baseline the exercises are good    Status Achieved               PT Long Term Goals - 07/19/21 1410       PT LONG TERM GOAL #1   Title ind with advanced HEP    Status On-going      PT LONG TERM GOAL #2   Title Pt will report no fecal leakage for at least 2 weeks    Baseline at least 1/week and  was the day before yesterday      PT LONG TERM GOAL #3   Title Pt is down to 1-2 small pads/day    Baseline same - 4 extra large pads    Status On-going      PT LONG TERM GOAL #4   Title Pt will demonstrate 5x sit to stand in <12 seconds for reduced risk of falls    Status On-going                   Plan - 07/19/21 1719     Clinical Impression Statement Pt goals re-assessed and updated today.  Pt is not able to afford more than 1 visit per month so asking for extension of plan of care due to pt complexity and expecting to need 8 more visits to address both balance and pelvic floor concerns.  Pt has demonstrated 5x sit to stand in 33 sec which is definite fall risk. She demonstrate hip weakness in her gait and unable to attempt single leg stand.  pt has low tolerance and decreased coordination with postural muscles and pelvic floor.  Pelvic floor assessed externally due to pt comfort level with internal exam.  Pt will benefit from skilled PT and guidance on improving her HEP so she can meet all of her functional goals.    PT Treatment/Interventions ADLs/Self Care Home Management;Biofeedback;Cryotherapy;Electrical Stimulation;Moist Heat;Therapeutic activities;Neuromuscular re-education;Therapeutic exercise;Patient/family education;Manual techniques;Passive range of motion;Dry  needling;Taping    PT Next Visit Plan kegel progressions with core and bands in standing, thoracic ext ROM and rotation strength incorporating balance; maybe add leg press - pt only able to come monthly, recheck HEP each visit    PT [place] and Agree with Plan of Care Patient             Patient will benefit from skilled therapeutic intervention in order to improve the following deficits and impairments:  Difficulty walking, Decreased coordination, Decreased strength, Decreased range of motion, Increased fascial restricitons  Visit Diagnosis: Muscle weakness (generalized)  Unspecified lack of coordination     Problem List Patient Active Problem List   Diagnosis Date Noted   Dyslipidemia associated with type 2 diabetes mellitus ([place]) 07/01/2021   CKD (chronic kidney disease) stage 3, GFR 30-59 ml/min ([place]) 02/10/2021   Diabetes mellitus without complication ([place]) 24/82/5003   Essential hypertension 12/09/2020   Tinnitus of both ears 12/09/2020   Leg edema 11/23/2020   OAB (overactive bladder) 08/12/2020   Lipoma 03/11/2018   Depression, major, in remission ([place]) 03/11/2018   Anxiety 03/11/2018    Jule Ser, PT 07/19/2021, 5:28 PM  [facility] @ [address] [place], [place], [address] Phone: [phone]   Fax:  [phone]  Name: KAESHA KIRSCH MRN: [id number] Date of Birth: November 14, 1950

## 2021-07-28 ENCOUNTER — Telehealth: Payer: Self-pay | Admitting: Family Medicine

## 2021-07-28 NOTE — Telephone Encounter (Signed)
Copied from [place] [phone]. Topic: Medicare AWV >> Jul 28, 2021 10:12 AM Harris-Coley, Hannah Beat wrote: Reason for CRM: Attempted to schedule AWV. Unable to LVM.  Will try at later time.

## 2021-08-16 ENCOUNTER — Encounter: Payer: No Typology Code available for payment source | Admitting: Physical Therapy

## 2021-08-23 ENCOUNTER — Ambulatory Visit: Payer: No Typology Code available for payment source | Admitting: Family Medicine

## 2021-08-23 ENCOUNTER — Other Ambulatory Visit: Payer: Self-pay | Admitting: Family Medicine

## 2021-08-24 NOTE — Telephone Encounter (Signed)
LAST APPOINTMENT DATE: 645/2023   NEXT APPOINTMENT DATE: Visit date not found    LAST REFILL:07/04/2021  QTY:60

## 2021-09-12 ENCOUNTER — Telehealth: Payer: Self-pay | Admitting: Family Medicine

## 2021-09-12 NOTE — Telephone Encounter (Signed)
Spoke with patient to sched AWV.  She state she will call back.

## 2021-09-20 ENCOUNTER — Encounter: Payer: No Typology Code available for payment source | Admitting: Physical Therapy

## 2021-09-22 ENCOUNTER — Encounter: Payer: Self-pay | Admitting: Family Medicine

## 2021-09-22 ENCOUNTER — Ambulatory Visit (INDEPENDENT_AMBULATORY_CARE_PROVIDER_SITE_OTHER): Payer: No Typology Code available for payment source | Admitting: Family Medicine

## 2021-09-22 VITALS — BP 175/85 | HR 88 | Temp 98.2°F | Ht 62.0 in | Wt 155.2 lb

## 2021-09-22 DIAGNOSIS — I10 Essential (primary) hypertension: Secondary | ICD-10-CM | POA: Diagnosis not present

## 2021-09-22 DIAGNOSIS — E1159 Type 2 diabetes mellitus with other circulatory complications: Secondary | ICD-10-CM | POA: Diagnosis not present

## 2021-09-22 DIAGNOSIS — N390 Urinary tract infection, site not specified: Secondary | ICD-10-CM

## 2021-09-22 DIAGNOSIS — R2681 Unsteadiness on feet: Secondary | ICD-10-CM

## 2021-09-22 DIAGNOSIS — I152 Hypertension secondary to endocrine disorders: Secondary | ICD-10-CM

## 2021-09-22 DIAGNOSIS — E119 Type 2 diabetes mellitus without complications: Secondary | ICD-10-CM

## 2021-09-22 MED ORDER — CLONAZEPAM 1 MG PO TABS: 1.0000 mg | ORAL_TABLET | Freq: Two times a day (BID) | ORAL | 5 refills | Status: DC | PRN

## 2021-09-22 NOTE — Progress Notes (Signed)
   Kristy Trujillo is a 71 y.o. female who presents today for an office visit.  Assessment/Plan:  Chronic Problems Addressed Today: Unsteady gait Likely multifactorial.  She does have some neuropathic symptoms likely secondary to diabetes which is contributing.  She did not have any improvement with a round of physical therapy.  We will place referral to neurology for further evaluation and management.  Diabetes mellitus without complication ([place]) To early to recheck her A1c today.  She is not currently on any medications that her most recent A1c has been at goal.  Hypertension associated with diabetes ([place]) Elevated today though she is typically well controlled on HCTZ 25 mg daily.  She will continue to monitor at home and let us know if persistently elevated.     Subjective:  HPI:  See A/P for status of chronic conditions.  We last saw her a couple of months ago.  At that time she had suffered a fall due to unsteady gait.  We had referred her to physical therapy.  She has not noticed any benefit with physical therapy.  Still feels very unsteady on her feet.  She does have some neuropathic symptoms in her feet and is not sure if this is contributing.  She does not have any chest pain or shortness of breath.  No syncopal symptoms.  No other specific treatments tried.  She thinks overall her unsteadiness seems to be worsening.       Objective:  Physical Exam: BP (!) 175/85   Pulse 88   Temp 98.2 F (36.8 C) (Temporal)   Ht '5\' 2"'$  (1.575 m)   Wt 155 lb 3.2 oz (70.4 kg)   SpO2 100%   BMI 28.39 kg/m   Gen: No acute distress, resting comfortably Neuro: Grossly normal, moves all extremities.  Unstable gait noted in the exam room. Psych: Normal affect and thought content      Karyssa Amaral M. Jerline Pain, MD 09/22/2021 1:45 PM

## 2021-09-22 NOTE — Assessment & Plan Note (Signed)
Likely multifactorial.  She does have some neuropathic symptoms likely secondary to diabetes which is contributing.  She did not have any improvement with a round of physical therapy.  We will place referral to neurology for further evaluation and management.

## 2021-09-22 NOTE — Assessment & Plan Note (Signed)
To early to recheck her A1c today.  She is not currently on any medications that her most recent A1c has been at goal.

## 2021-09-22 NOTE — Assessment & Plan Note (Signed)
Elevated today though she is typically well controlled on HCTZ 25 mg daily.  She will continue to monitor at home and let us know if persistently elevated.

## 2021-09-22 NOTE — Addendum Note (Signed)
Addended by: Betti Cruz on: 09/22/2021 02:08 PM   Modules accepted: Orders

## 2021-09-24 LAB — URINE CULTURE
MICRO NUMBER:: 13703028
SPECIMEN QUALITY:: ADEQUATE

## 2021-09-26 ENCOUNTER — Telehealth: Payer: Self-pay | Admitting: Family Medicine

## 2021-09-26 ENCOUNTER — Other Ambulatory Visit: Payer: Self-pay | Admitting: *Deleted

## 2021-09-26 MED ORDER — NITROFURANTOIN MONOHYD MACRO 100 MG PO CAPS: 100.0000 mg | ORAL_CAPSULE | Freq: Two times a day (BID) | ORAL | 0 refills | Status: DC

## 2021-09-26 NOTE — Telephone Encounter (Signed)
Patient has seen Dr. Ellwood Handler response in regard to labs.    Patient is requesting a call back as soon as medication has been sent to [facility] in [place].

## 2021-09-26 NOTE — Telephone Encounter (Signed)
See results note. 

## 2021-09-26 NOTE — Progress Notes (Signed)
Please inform patient of the following:  Culture confirms UTI.  Please send in Macrobid 100 mg twice daily x5 days.

## 2021-09-28 ENCOUNTER — Encounter: Payer: Self-pay | Admitting: Neurology

## 2021-10-06 ENCOUNTER — Other Ambulatory Visit: Payer: Self-pay | Admitting: Family Medicine

## 2021-10-06 ENCOUNTER — Ambulatory Visit (INDEPENDENT_AMBULATORY_CARE_PROVIDER_SITE_OTHER): Payer: No Typology Code available for payment source

## 2021-10-06 DIAGNOSIS — Z0000 Encounter for general adult medical examination without abnormal findings: Secondary | ICD-10-CM | POA: Diagnosis not present

## 2021-10-06 NOTE — Patient Instructions (Signed)
Kristy Trujillo , Thank you for taking time to come for your Medicare Wellness Visit. I appreciate your ongoing commitment to your health goals. Please review the following plan we discussed and let me know if I can assist you in the future.   Screening recommendations/referrals: Colonoscopy: declined and discussed  Mammogram: declined  Bone Density: declined  Recommended yearly ophthalmology/optometry visit for glaucoma screening and checkup Recommended yearly dental visit for hygiene and checkup  Vaccinations: Influenza vaccine: declined  Pneumococcal vaccine: declined  Tdap vaccine: done 05/04/12 repeat every 10 years  Shingles vaccine: Shingrix discussed. Please contact your pharmacy for coverage information.    Covid-19:completed 1/28, 04/24/19  Advanced directives: Advance directive discussed with you today. Even though you declined this today please call our office should you change your mind and we can give you the proper paperwork for you to fill out.  Conditions/risks identified: stay healthy   Next appointment: Follow up in one year for your annual wellness visit    Preventive Care 65 Years and Older, Female Preventive care refers to lifestyle choices and visits with your health care provider that can promote health and wellness. What does preventive care include? A yearly physical exam. This is also called an annual well check. Dental exams once or twice a year. Routine eye exams. Ask your health care provider how often you should have your eyes checked. Personal lifestyle choices, including: Daily care of your teeth and gums. Regular physical activity. Eating a healthy diet. Avoiding tobacco and drug use. Limiting alcohol use. Practicing safe sex. Taking low-dose aspirin every day. Taking vitamin and mineral supplements as recommended by your health care provider. What happens during an annual well check? The services and screenings done by your health care provider  during your annual well check will depend on your age, overall health, lifestyle risk factors, and family history of disease. Counseling  Your health care provider may ask you questions about your: Alcohol use. Tobacco use. Drug use. Emotional well-being. Home and relationship well-being. Sexual activity. Eating habits. History of falls. Memory and ability to understand (cognition). Work and work Statistician. Reproductive health. Screening  You may have the following tests or measurements: Height, weight, and BMI. Blood pressure. Lipid and cholesterol levels. These may be checked every 5 years, or more frequently if you are over 47 years old. Skin check. Lung cancer screening. You may have this screening every year starting at age 57 if you have a 30-pack-year history of smoking and currently smoke or have quit within the past 15 years. Fecal occult blood test (FOBT) of the stool. You may have this test every year starting at age 2. Flexible sigmoidoscopy or colonoscopy. You may have a sigmoidoscopy every 5 years or a colonoscopy every 10 years starting at age 22. Hepatitis C blood test. Hepatitis B blood test. Sexually transmitted disease (STD) testing. Diabetes screening. This is done by checking your blood sugar (glucose) after you have not eaten for a while (fasting). You may have this done every 1-3 years. Bone density scan. This is done to screen for osteoporosis. You may have this done starting at age 63. Mammogram. This may be done every 1-2 years. Talk to your health care provider about how often you should have regular mammograms. Talk with your health care provider about your test results, treatment options, and if necessary, the need for more tests. Vaccines  Your health care provider may recommend certain vaccines, such as: Influenza vaccine. This is recommended every year. Tetanus, diphtheria,  and acellular pertussis (Tdap, Td) vaccine. You may need a Td booster every  10 years. Zoster vaccine. You may need this after age 76. Pneumococcal 13-valent conjugate (PCV13) vaccine. One dose is recommended after age 76. Pneumococcal polysaccharide (PPSV23) vaccine. One dose is recommended after age 100. Talk to your health care provider about which screenings and vaccines you need and how often you need them. This information is not intended to replace advice given to you by your health care provider. Make sure you discuss any questions you have with your health care provider. Document Released: 03/12/2015 Document Revised: 11/03/2015 Document Reviewed: 12/15/2014 Elsevier Interactive Patient Education  2017 [place] Prevention in the Home Falls can cause injuries. They can happen to people of all ages. There are many things you can do to make your home safe and to help prevent falls. What can I do on the outside of my home? Regularly fix the edges of walkways and driveways and fix any cracks. Remove anything that might make you trip as you walk through a door, such as a raised step or threshold. Trim any bushes or trees on the path to your home. Use bright outdoor lighting. Clear any walking paths of anything that might make someone trip, such as rocks or tools. Regularly check to see if handrails are loose or broken. Make sure that both sides of any steps have handrails. Any raised decks and porches should have guardrails on the edges. Have any leaves, snow, or ice cleared regularly. Use sand or salt on walking paths during winter. Clean up any spills in your garage right away. This includes oil or grease spills. What can I do in the bathroom? Use night lights. Install grab bars by the toilet and in the tub and shower. Do not use towel bars as grab bars. Use non-skid mats or decals in the tub or shower. If you need to sit down in the shower, use a plastic, non-slip stool. Keep the floor dry. Clean up any water that spills on the floor as soon as it  happens. Remove soap buildup in the tub or shower regularly. Attach bath mats securely with double-sided non-slip rug tape. Do not have throw rugs and other things on the floor that can make you trip. What can I do in the bedroom? Use night lights. Make sure that you have a light by your bed that is easy to reach. Do not use any sheets or blankets that are too big for your bed. They should not hang down onto the floor. Have a firm chair that has side arms. You can use this for support while you get dressed. Do not have throw rugs and other things on the floor that can make you trip. What can I do in the kitchen? Clean up any spills right away. Avoid walking on wet floors. Keep items that you use a lot in easy-to-reach places. If you need to reach something above you, use a strong step stool that has a grab bar. Keep electrical cords out of the way. Do not use floor polish or wax that makes floors slippery. If you must use wax, use non-skid floor wax. Do not have throw rugs and other things on the floor that can make you trip. What can I do with my stairs? Do not leave any items on the stairs. Make sure that there are handrails on both sides of the stairs and use them. Fix handrails that are broken or loose. Make sure  that handrails are as long as the stairways. Check any carpeting to make sure that it is firmly attached to the stairs. Fix any carpet that is loose or worn. Avoid having throw rugs at the top or bottom of the stairs. If you do have throw rugs, attach them to the floor with carpet tape. Make sure that you have a light switch at the top of the stairs and the bottom of the stairs. If you do not have them, ask someone to add them for you. What else can I do to help prevent falls? Wear shoes that: Do not have high heels. Have rubber bottoms. Are comfortable and fit you well. Are closed at the toe. Do not wear sandals. If you use a stepladder: Make sure that it is fully opened.  Do not climb a closed stepladder. Make sure that both sides of the stepladder are locked into place. Ask someone to hold it for you, if possible. Clearly mark and make sure that you can see: Any grab bars or handrails. First and last steps. Where the edge of each step is. Use tools that help you move around (mobility aids) if they are needed. These include: Canes. Walkers. Scooters. Crutches. Turn on the lights when you go into a dark area. Replace any light bulbs as soon as they burn out. Set up your furniture so you have a clear path. Avoid moving your furniture around. If any of your floors are uneven, fix them. If there are any pets around you, be aware of where they are. Review your medicines with your doctor. Some medicines can make you feel dizzy. This can increase your chance of falling. Ask your doctor what other things that you can do to help prevent falls. This information is not intended to replace advice given to you by your health care provider. Make sure you discuss any questions you have with your health care provider. Document Released: 12/10/2008 Document Revised: 07/22/2015 Document Reviewed: 03/20/2014 Elsevier Interactive Patient Education  2017 [employer].

## 2021-10-06 NOTE — Progress Notes (Signed)
Virtual Visit via Telephone Note  I connected with  Kristy Trujillo on 10/06/21 at  2:00 PM EDT by telephone and verified that I am speaking with the correct person using two identifiers.  Medicare Annual Wellness visit completed telephonically due to Covid-19 pandemic.   Persons participating in this call: This Health Coach and this patient.   Location: Patient: home Provider: office    I discussed the limitations, risks, security and privacy concerns of performing an evaluation and management service by telephone and the availability of in person appointments. The patient expressed understanding and agreed to proceed.  Unable to perform video visit due to video visit attempted and failed and/or patient does not have video capability.   Some vital signs may be absent or patient reported.   Willette Brace, LPN   Subjective:   Kristy Trujillo is a 71 y.o. female who presents for Medicare Annual (Subsequent) preventive examination.  Review of Systems     Cardiac Risk Factors include: advanced age (>51mn, >>54women);diabetes mellitus;hypertension;dyslipidemia     Objective:    There were no vitals filed for this visit. There is no height or weight on file to calculate BMI.     10/06/2021    2:06 PM 02/01/2021    2:10 PM 06/12/2019   11:31 AM  Advanced Directives  Does Patient Have a Medical Advance Directive? No No No  Would patient like information on creating a medical advance directive? No - Patient declined No - Patient declined Yes (MAU/Ambulatory/Procedural Areas - Information given)    Current Medications (verified) Outpatient Encounter Medications as of 10/06/2021  Medication Sig   atorvastatin (LIPITOR) 40 MG tablet Take 1 tablet (40 mg total) by mouth daily.   clonazePAM (KLONOPIN) 1 MG tablet Take 1 tablet (1 mg total) by mouth 2 (two) times daily as needed. for anxiety   FLUoxetine (PROZAC) 40 MG capsule Take 1 capsule by mouth twice daily   gabapentin  (NEURONTIN) 300 MG capsule TAKE 1 CAPSULE BY MOUTH THREE TIMES DAILY   hydrochlorothiazide (HYDRODIURIL) 25 MG tablet Take 1 tablet by mouth once daily   hydrOXYzine (ATARAX) 25 MG tablet TAKE 1 TABLET BY MOUTH THREE TIMES DAILY   mirabegron ER (MYRBETRIQ) 25 MG TB24 tablet Take 1 tablet (25 mg total) by mouth daily.   Multiple Vitamins-Minerals (MULTIVITAMINS THER. W/MINERALS) TABS Take 1 tablet by mouth daily.   nitrofurantoin, macrocrystal-monohydrate, (MACROBID) 100 MG capsule Take 1 capsule (100 mg total) by mouth 2 (two) times daily.   OVER THE COUNTER MEDICATION Take 2 tablets by mouth every morning. OTC allergy and sinus tablets   phenazopyridine (PYRIDIUM) 200 MG tablet Take 1 tablet (200 mg total) by mouth 3 (three) times daily as needed for pain.   triamcinolone ointment (KENALOG) 0.5 % Apply 1 application topically 2 (two) times daily.   valACYclovir (VALTREX) 1000 MG tablet Take 1 tablet (1,000 mg total) by mouth 3 (three) times daily.   No facility-administered encounter medications on file as of 10/06/2021.    Allergies (verified) Alcohol detox [nutritional supplements], Codeine, and Erythromycin   History: Past Medical History:  Diagnosis Date   Gallstones    Shingles    Past Surgical History:  Procedure Laterality Date   CHOLECYSTECTOMY     Family History  Problem Relation Age of Onset   Diabetes Neg Hx    High Cholesterol Neg Hx    Hypertension Neg Hx    Colon cancer Neg Hx    Breast cancer Neg  Hx    Social History   Socioeconomic History   Marital status: Divorced    Spouse name: Not on file   Number of children: Not on file   Years of education: Not on file   Highest education level: Not on file  Occupational History   Occupation: Retired     Comment: Architect   Tobacco Use   Smoking status: Never   Smokeless tobacco: Never  Substance and Sexual Activity   Alcohol use: No   Drug use: No   Sexual activity: Not on file  Other Topics Concern    Not on file  Social History Narrative   Not on file   Social Determinants of Health   Financial Resource Strain: Low Risk  (10/06/2021)   Overall Financial Resource Strain (CARDIA)    Difficulty of Paying Living Expenses: Not hard at all  Food Insecurity: No Food Insecurity (10/06/2021)   Hunger Vital Sign    Worried About Running Out of Food in the Last Year: Never true    [place] in the Last Year: Never true  Transportation Needs: No Transportation Needs (10/06/2021)   PRAPARE - Hydrologist (Medical): No    Lack of Transportation (Non-Medical): No  Physical Activity: Inactive (10/06/2021)   Exercise Vital Sign    Days of Exercise per Week: 0 days    Minutes of Exercise per Session: 0 min  Stress: No Stress Concern Present (10/06/2021)   [place]    Feeling of Stress : Not at all  Social Connections: Moderately Integrated (10/06/2021)   Social Connection and Isolation Panel [NHANES]    Frequency of Communication with Friends and Family: More than three times a week    Frequency of Social Gatherings with Friends and Family: More than three times a week    Attends Religious Services: More than 4 times per year    Active Member of [employer] or Organizations: Yes    Attends Archivist Meetings: 1 to 4 times per year    Marital Status: Divorced    Tobacco Counseling Counseling given: Not Answered   Clinical Intake:  Pre-visit preparation completed: Yes  Pain : No/denies pain     BMI - recorded: 28.39 Nutritional Status: BMI 25 -29 Overweight Nutritional Risks: None Diabetes: Yes CBG done?: No Did pt. bring in CBG monitor from home?: No  How often do you need to have someone help you when you read instructions, pamphlets, or other written materials from your doctor or pharmacy?: 1 - Never  Diabetic?Nutrition Risk Assessment:  Has the patient had any N/V/D within  the last 2 months?  No  Does the patient have any non-healing wounds?  No  Has the patient had any unintentional weight loss or weight gain?  No   Diabetes:  Is the patient diabetic?  Yes  If diabetic, was a CBG obtained today?  No  Did the patient bring in their glucometer from home?  No  How often do you monitor your CBG's? N/A.   Financial Strains and Diabetes Management:  Are you having any financial strains with the device, your supplies or your medication? No .  Does the patient want to be seen by Chronic Care Management for management of their diabetes?  No  Would the patient like to be referred to a Nutritionist or for Diabetic Management?  No   Diabetic Exams:  Diabetic Eye Exam: Overdue for  diabetic eye exam. Pt has been advised about the importance in completing this exam. Patient advised to call and schedule an eye exam. Diabetic Foot Exam: Overdue, Pt has been advised about the importance in completing this exam. Pt is scheduled for diabetic foot exam on next appt.   Interpreter Needed?: No  Information entered by :: Charlott Rakes, LPN   Activities of Daily Living    10/06/2021    2:06 PM  In your present state of health, do you have any difficulty performing the following activities:  Hearing? 0  Vision? 0  Difficulty concentrating or making decisions? 0  Walking or climbing stairs? 0  Dressing or bathing? 0  Doing errands, shopping? 0  Preparing Food and eating ? N  Using the Toilet? N  In the past six months, have you accidently leaked urine? Y  Comment wears a brief and pad  Do you have problems with loss of bowel control? N  Managing your Medications? N  Managing your Finances? N  Housekeeping or managing your Housekeeping? N    Patient Care Team: Vivi Barrack, MD as PCP - General (Family Medicine)  Indicate any recent Medical Services you may have received from other than [facility] providers in the past year (date may be approximate).      Assessment:   This is a routine wellness examination for Kristy Trujillo.  Hearing/Vision screen Hearing Screening - Comments:: Pt denies any hearing issues  Vision Screening - Comments:: Pt encouraged to follow up with provider   Dietary issues and exercise activities discussed: Current Exercise Habits: The patient does not participate in regular exercise at present   Goals Addressed             This Visit's Progress    Patient Stated       Stay healthy        Depression Screen    10/06/2021    2:04 PM 06/30/2021    1:27 PM 03/15/2021    2:17 PM 11/23/2020    2:30 PM 06/12/2019   11:32 AM 03/11/2018    4:00 PM  PHQ 2/9 Scores  PHQ - 2 Score 0 0 0 '4 2 2  '$ PHQ- 9 Score  1 0 '9 2 12    '$ Fall Risk    10/06/2021    2:06 PM 09/22/2021    1:10 PM 06/30/2021    1:28 PM 11/23/2020    1:36 PM 06/12/2019   11:32 AM  Fall Risk   Falls in the past year? 0 0 1 0 0  Number falls in past yr: 0 0 1 0 0  Injury with Fall? 0 0 0 0 0  Risk for fall due to : Impaired vision No Fall Risks History of fall(s) Impaired balance/gait   Follow up Falls prevention discussed    Falls evaluation completed;Education provided;Falls prevention discussed    FALL RISK PREVENTION PERTAINING TO THE HOME:  Any stairs in or around the home? No  If so, are there any without handrails? No  Home free of loose throw rugs in walkways, pet beds, electrical cords, etc? Yes  Adequate lighting in your home to reduce risk of falls? Yes   ASSISTIVE DEVICES UTILIZED TO PREVENT FALLS:  Life alert? No  Use of a cane, walker or w/c? No  Grab bars in the bathroom? No  Shower chair or bench in shower? No  Elevated toilet seat or a handicapped toilet? No   TIMED UP AND GO:  Was the test performed?  No .   Cognitive Function: declined         06/12/2019   11:32 AM  6CIT Screen  What Year? 0 points  What month? 0 points  What time? 0 points  Count back from 20 0 points  Months in reverse 0 points  Repeat phrase 0  points  Total Score 0 points    Immunizations Immunization History  Administered Date(s) Administered   Fluad Quad(high Dose 65+) 12/23/2018   PFIZER(Purple Top)SARS-COV-2 Vaccination 03/27/2019, 04/24/2019   Tdap 05/04/2012    TDAP status: Up to date  Flu Vaccine status: Declined, Education has been provided regarding the importance of this vaccine but patient still declined. Advised may receive this vaccine at local pharmacy or Health Dept. Aware to provide a copy of the vaccination record if obtained from local pharmacy or Health Dept. Verbalized acceptance and understanding.  Pneumococcal vaccine status: Declined,  Education has been provided regarding the importance of this vaccine but patient still declined. Advised may receive this vaccine at local pharmacy or Health Dept. Aware to provide a copy of the vaccination record if obtained from local pharmacy or Health Dept. Verbalized acceptance and understanding.   Covid-19 vaccine status: Completed vaccines  Qualifies for Shingles Vaccine? Yes   Zostavax completed No   Shingrix Completed?: No.    Education has been provided regarding the importance of this vaccine. Patient has been advised to call insurance company to determine out of pocket expense if they have not yet received this vaccine. Advised may also receive vaccine at local pharmacy or Health Dept. Verbalized acceptance and understanding.  Screening Tests Health Maintenance  Topic Date Due   FOOT EXAM  Never done   OPHTHALMOLOGY EXAM  Never done   URINE MICROALBUMIN  Never done   Hepatitis C Screening  Never done   Zoster Vaccines- Shingrix (1 of 2) Never done   INFLUENZA VACCINE  09/27/2021   DEXA SCAN  11/23/2021 (Originally 07/29/2014)   COLONOSCOPY (Pts 45-50yr Insurance coverage will need to be confirmed)  11/23/2021 (Originally 07/29/1994)   Pneumonia Vaccine 72 Years old (1 - PCV) 05/18/2022 (Originally 07/29/2014)   MAMMOGRAM  10/15/2021   HEMOGLOBIN A1C   12/31/2021   TETANUS/TDAP  05/05/2022   HPV VACCINES  Aged Out   COVID-19 Vaccine  Discontinued    Health Maintenance  Health Maintenance Due  Topic Date Due   FOOT EXAM  Never done   OPHTHALMOLOGY EXAM  Never done   URINE MICROALBUMIN  Never done   Hepatitis C Screening  Never done   Zoster Vaccines- Shingrix (1 of 2) Never done   INFLUENZA VACCINE  09/27/2021     Colorectal cancer screening: No longer required. Per pt   Mammogram status: No longer required due to per pt .     Additional Screening:  Hepatitis C Screening: does qualify;  Vision Screening: Recommended annual ophthalmology exams for early detection of glaucoma and other disorders of the eye. Is the patient up to date with their annual eye exam?  No  Who is the provider or what is the name of the office in which the patient attends annual eye exams? Encouraged to follow up with provider  If pt is not established with a provider, would they like to be referred to a provider to establish care? Yes .   Dental Screening: Recommended annual dental exams for proper oral hygiene  Community Resource Referral / Chronic Care Management: CRR required this visit?  No   CCM required  this visit?  No      Plan:     I have personally reviewed and noted the following in the patient's chart:   Medical and social history Use of alcohol, tobacco or illicit drugs  Current medications and supplements including opioid prescriptions.  Functional ability and status Nutritional status Physical activity Advanced directives List of other physicians Hospitalizations, surgeries, and ER visits in previous 12 months Vitals Screenings to include cognitive, depression, and falls Referrals and appointments  In addition, I have reviewed and discussed with patient certain preventive protocols, quality metrics, and best practice recommendations. A written personalized care plan for preventive services as well as general  preventive health recommendations were provided to patient.     Willette Brace, LPN   03/31/3341   Nurse Notes: none

## 2021-10-08 ENCOUNTER — Encounter: Payer: Self-pay | Admitting: Family Medicine

## 2021-10-11 ENCOUNTER — Telehealth: Payer: Self-pay | Admitting: Family Medicine

## 2021-10-11 NOTE — Telephone Encounter (Signed)
Patient states Dr. Jerline Pain treated her for a UTI back on 7/27.  States she has completed antibiotics.  States UTI has come back and it is very painful to urinate.  I have offered to schedule an appointment for evaluation.   Patient has refused.  States she wants a messages sent around to Dr. Jerline Pain to ask if another course of antibiotic can be sent.

## 2021-10-13 ENCOUNTER — Other Ambulatory Visit: Payer: Self-pay

## 2021-10-13 MED ORDER — NITROFURANTOIN MONOHYD MACRO 100 MG PO CAPS: 100.0000 mg | ORAL_CAPSULE | Freq: Two times a day (BID) | ORAL | 0 refills | Status: DC

## 2021-10-13 NOTE — Telephone Encounter (Signed)
Rx sent to pharmacy and pt advised.

## 2021-10-13 NOTE — Telephone Encounter (Signed)
Please advise 

## 2021-10-13 NOTE — Telephone Encounter (Signed)
Please send in another round of antibiotics.  Algis Greenhouse. Jerline Pain, MD 10/13/2021 12:32 PM

## 2021-10-13 NOTE — Telephone Encounter (Signed)
Spoke to pt, verbalized understanding that Rx was sent to pharmacy and they will need to schedule appt if still having symptoms after 10 days.

## 2021-10-14 ENCOUNTER — Ambulatory Visit: Payer: No Typology Code available for payment source | Admitting: Family Medicine

## 2021-10-18 ENCOUNTER — Encounter: Payer: No Typology Code available for payment source | Admitting: Physical Therapy

## 2021-11-01 NOTE — Progress Notes (Unsigned)
Initial neurology clinic note  SERVICE DATE: 11/02/21 SERVICE TIME: 2:30 pm  Reason for Evaluation: Consultation requested by Kristy Barrack, MD for an opinion regarding unsteady gait. My final recommendations will be communicated back to the requesting physician by way of shared medical record or letter to requesting physician via [place] mail.  HPI: This is [name], a 71 y.o. ***-handed female with a medical history of DM, HTN, HLD*** who presents to neurology clinic with the chief complaint of ***. The patient is accompanied by ***.  ***  Patient was seen by her PCP, Dr. Jerline Trujillo for unsteady gait earlier in 2023. PT referral was given without clear benefit. She was then referred to neurology on 09/22/21. Of note, patient had a culture confirmed UTI with macrobid treatment at her 09/22/21 visit with Dr. Jerline Trujillo.  Current neuropathic Trujillo medications: gabapentin 300 mg TID*** Previous medications: ***  The patient has not*** had similar episodes of symptoms in the past. *** Muscle bulk loss? *** Muscle Trujillo? *** Cramps/Twitching? *** Suggestion of myotonia/difficulty relaxing after contraction? *** Fatigable weakness?*** Does strength improve after brief exercise?*** Able to brush hair/teeth without difficulty? *** Able to button shirts/use zips? *** Clumsiness/dropping grasped objects?*** Can you arise from squatted position easily? *** Able to get out of chair without using arms? *** Able to walk up steps easily? *** Use an assistive device to walk? *** Significant imbalance with walking? *** Falls?*** Any change in urine color, especially after exertion/physical activity? ***  The patient denies*** symptoms suggestive of oculobulbar weakness including diplopia, ptosis, dysphagia, poor saliva control, dysarthria/dysphonia, impaired mastication, facial weakness/droop.  There are no*** neuromuscular respiratory weakness symptoms, particularly orthopnea>dyspnea. Pseudobulbar affect  is absent***. The patient does not*** report symptoms referable to autonomic dysfunction including impaired sweating, heat or cold intolerance, excessive mucosal dryness, gastroparetic early satiety, postprandial abdominal bloating, constipation, bowel or bladder dyscontrol, erectile dysfunction*** or syncope/presyncope/orthostatic intolerance.  There are no*** complaints relating to other symptoms of small fiber modalities including paresthesia/Trujillo.  The patient has not *** noticed any recent skin rashes nor does he*** report any constitutional symptoms like fever, night sweats, anorexia or unintentional weight loss.  EtOH use: ***  Restrictive diet? *** Family history of neuropathy/myopathy/NM disease?***  Previous labs, electrodiagnostics, and neuroimaging are summarized below, but pertinent findings include***  Any biopsy done? *** Current medications being tried for the patient's symptoms include ***  Prior medications that have been tried: ***   MEDICATIONS:  Outpatient Encounter Medications as of 11/02/2021  Medication Sig   atorvastatin (LIPITOR) 40 MG tablet Take 1 tablet (40 mg total) by mouth daily.   clonazePAM (KLONOPIN) 1 MG tablet Take 1 tablet (1 mg total) by mouth 2 (two) times daily as needed. for anxiety   FLUoxetine (PROZAC) 40 MG capsule Take 1 capsule by mouth twice daily   gabapentin (NEURONTIN) 300 MG capsule TAKE 1 CAPSULE BY MOUTH THREE TIMES DAILY   hydrochlorothiazide (HYDRODIURIL) 25 MG tablet Take 1 tablet by mouth once daily   hydrOXYzine (ATARAX) 25 MG tablet TAKE 1 TABLET BY MOUTH THREE TIMES DAILY   mirabegron ER (MYRBETRIQ) 25 MG TB24 tablet Take 1 tablet (25 mg total) by mouth daily.   Multiple Vitamins-Minerals (MULTIVITAMINS THER. W/MINERALS) TABS Take 1 tablet by mouth daily.   nitrofurantoin, macrocrystal-monohydrate, (MACROBID) 100 MG capsule Take 1 capsule (100 mg total) by mouth 2 (two) times daily.   OVER THE COUNTER MEDICATION Take 2 tablets by  mouth every morning. OTC allergy and sinus  tablets   phenazopyridine (PYRIDIUM) 200 MG tablet Take 1 tablet (200 mg total) by mouth 3 (three) times daily as needed for Trujillo.   triamcinolone ointment (KENALOG) 0.5 % Apply 1 application topically 2 (two) times daily.   valACYclovir (VALTREX) 1000 MG tablet Take 1 tablet (1,000 mg total) by mouth 3 (three) times daily.   No facility-administered encounter medications on file as of 11/02/2021.    PAST MEDICAL HISTORY: Past Medical History:  Diagnosis Date   Gallstones    Shingles     PAST SURGICAL HISTORY: Past Surgical History:  Procedure Laterality Date   CHOLECYSTECTOMY      ALLERGIES: Allergies  Allergen Reactions   Alcohol Detox [Nutritional Supplements]     Flushing (cough medicine)   Codeine Swelling   Erythromycin Swelling    FAMILY HISTORY: Family History  Problem Relation Age of Onset   Diabetes Neg Hx    High Cholesterol Neg Hx    Hypertension Neg Hx    Colon cancer Neg Hx    Breast cancer Neg Hx     SOCIAL HISTORY: Social History   Tobacco Use   Smoking status: Never   Smokeless tobacco: Never  Substance Use Topics   Alcohol use: No   Drug use: No   Social History   Social History Narrative   Not on file     OBJECTIVE: REVIEW OF SYSTEMS: ***Negative except as noted in HPI  PHYSICAL EXAM: There were no vitals taken for this visit.  General:*** General appearance: Awake and alert. No distress. Cooperative with exam.  Skin: No obvious rash or jaundice. HEENT: Atraumatic. Anicteric. Lungs: Non-labored breathing on room air  Heart: Regular Abdomen: Soft, non tender. Extremities: No edema. No obvious deformity.  Musculoskeletal: No obvious joint swelling. Psych: Affect appropriate.  Neurological: Mental Status: Alert. Speech fluent. No pseudobulbar affect Cranial Nerves: CNII: No RAPD. Visual fields intact. CNIII, IV, VI: PERRL. No nystagmus. EOMI. CN V: Facial sensation intact  bilaterally to fine touch. [place] clench strong. Jaw jerk***. CN VII: Facial muscles symmetric and strong. No ptosis at rest or after sustained upgaze***. CN VIII: Hears finger rub well bilaterally. CN IX: No hypophonia. CN X: Palate elevates symmetrically. CN XI: Full strength shoulder shrug bilaterally. CN XII: Tongue protrusion full and midline. No atrophy or fasciculations. No significant dysarthria*** Motor: Tone is ***. *** fasciculations in *** extremities. *** atrophy. No grip or percussive myotonia.  Individual muscle group testing (MRC grade out of 5):  Movement     Neck flexion ***    Neck extension ***     Right Left   Shoulder abduction *** ***   Shoulder adduction *** ***   Shoulder ext rotation *** ***   Shoulder int rotation *** ***   Elbow flexion *** ***   Elbow extension *** ***   Wrist extension *** ***   Wrist flexion *** ***   Finger abduction - FDI *** ***   Finger abduction - ADM *** ***   Finger extension *** ***   Finger distal flexion - 2/3 *** ***   Finger distal flexion - 4/5 *** ***   Thumb flexion - FPL *** ***   Thumb abduction - APB *** ***    Hip flexion *** ***   Hip extension *** ***   Hip adduction *** ***   Hip abduction *** ***   Knee extension *** ***   Knee flexion *** ***   Dorsiflexion *** ***   Plantarflexion *** ***  Inversion *** ***   Eversion *** ***   Great toe extension *** ***   Great toe flexion *** ***     Reflexes:  Right Left   Bicep *** ***   Tricep *** ***   BrRad *** ***   Knee *** ***   Ankle *** ***    Pathological Reflexes: Babinski: *** response bilaterally*** Hoffman: *** Troemner: *** Pectoral: *** Palmomental: *** Facial: *** Midline tap: *** Sensation: Pinprick: *** Vibration: *** Temperature: *** Proprioception: *** Coordination: Intact finger-to- nose-finger bilaterally. Romberg negative.*** Gait: Able to rise from chair with arms crossed unassisted. Normal, narrow-based gait.  Able to tandem walk. Able to walk on toes and heels.***  Lab and Test Review: Internal labs: 06/30/21: HbA1c: 7.3 (9.8 in 2022) CMP remarkable for elevated glucose, BUN, and Cr (1.41) Normal or unremarkable: CBC, TSH, lipid panel  CT head (2001): Normal ***  ASSESSMENT: Kristy Trujillo is a 71 y.o. female who presents for evaluation of ***. *** has a relevant medical history of ***. *** neurological examination is pertinent for ***. Available diagnostic data is significant for ***. This constellation of symptoms and objective data would most likely localize to ***. ***  PLAN: -Blood work: *** ***  -Return to clinic ***  The impression above as well as the plan as outlined below were extensively discussed with the patient (in the company of ***) who voiced understanding. All questions were answered to their satisfaction.  The patient was counseled on pertinent fall precautions per the printed material provided today ***, and as noted under the "Patient Instructions" section below.  When available, results of the above investigations and possible further recommendations will be communicated to the patient via telephone/MyChart. Patient to call office if not contacted after expected testing turnaround time.   Total time spent reviewing records, interview, history/exam, documentation, and coordination of care on day of encounter:  *** min   Thank you for allowing me to participate in patient's care.  If I can answer any additional questions, I would be pleased to do so.  Kai Levins, MD   CC: Kristy Trujillo, [place] [id number]  CC: Referring provider: Vivi Barrack, MD [address] [place],  [place] [address]

## 2021-11-02 ENCOUNTER — Other Ambulatory Visit (INDEPENDENT_AMBULATORY_CARE_PROVIDER_SITE_OTHER): Payer: No Typology Code available for payment source

## 2021-11-02 ENCOUNTER — Encounter: Payer: Self-pay | Admitting: Neurology

## 2021-11-02 ENCOUNTER — Ambulatory Visit (INDEPENDENT_AMBULATORY_CARE_PROVIDER_SITE_OTHER): Payer: No Typology Code available for payment source | Admitting: Neurology

## 2021-11-02 VITALS — BP 145/74 | HR 99 | Ht 62.0 in | Wt 162.0 lb

## 2021-11-02 DIAGNOSIS — R251 Tremor, unspecified: Secondary | ICD-10-CM

## 2021-11-02 DIAGNOSIS — R209 Unspecified disturbances of skin sensation: Secondary | ICD-10-CM

## 2021-11-02 DIAGNOSIS — R269 Unspecified abnormalities of gait and mobility: Secondary | ICD-10-CM

## 2021-11-02 DIAGNOSIS — E1142 Type 2 diabetes mellitus with diabetic polyneuropathy: Secondary | ICD-10-CM

## 2021-11-02 NOTE — Patient Instructions (Addendum)
I saw you today for numbness and tingling and problems walking.  The cause of your symptoms is nerve damage called neuropathy. It is most likely because of diabetes. You should discuss diabetes treatment with your primary care doctor because your neuropathy will get worse if your diabetes is not treated.  I will do lab work today to make sure there is no other cause.  I also want to do a muscle and nerve test called an EMG to make sure there is not another cause of your symptoms. I am giving you information below about the EMG.  I would like you to try lidocaine cream for the tingling in your legs. You can buy this over the counter. Wear gloves when you apply it or your hands will be numb.  There are other options should this not give good relief. Just let me know.  I would like to see you again in clinic in 6 months.  The physicians and staff at [facility] are committed to providing excellent care. You may receive a survey requesting feedback about your experience at our office. We strive to receive "very good" responses to the survey questions. If you feel that your experience would prevent you from giving the office a "very good " response, please contact our office to try to remedy the situation. We may be reached at [phone]. Thank you for taking the time out of your busy day to complete the survey.  Kai Levins, MD [facility]   ELECTROMYOGRAM AND NERVE CONDUCTION STUDIES (EMG/NCS) INSTRUCTIONS  How to Prepare The neurologist conducting the EMG will need to know if you have certain medical conditions. Tell the neurologist and other EMG lab personnel if you: Have a pacemaker or any other electrical medical device Take blood-thinning medications Have hemophilia, a blood-clotting disorder that causes prolonged bleeding Bathing Take a shower or bath shortly before your exam in order to remove oils from your skin. Don't apply lotions or creams before the exam.  What to  Expect You'll likely be asked to change into a hospital gown for the procedure and lie down on an examination table. The following explanations can help you understand what will happen during the exam.  Electrodes. The neurologist or a technician places surface electrodes at various locations on your skin depending on where you're experiencing symptoms. Or the neurologist may insert needle electrodes at different sites depending on your symptoms.  Sensations. The electrodes will at times transmit a tiny electrical current that you may feel as a twinge or spasm. The needle electrode may cause discomfort or pain that usually ends shortly after the needle is removed. If you are concerned about discomfort or pain, you may want to talk to the neurologist about taking a short break during the exam.  Instructions. During the needle EMG, the neurologist will assess whether there is any spontaneous electrical activity when the muscle is at rest - activity that isn't present in healthy muscle tissue - and the degree of activity when you slightly contract the muscle.  He or she will give you instructions on resting and contracting a muscle at appropriate times. Depending on what muscles and nerves the neurologist is examining, he or she may ask you to change positions during the exam.  After your EMG You may experience some temporary, minor bruising where the needle electrode was inserted into your muscle. This bruising should fade within several days. If it persists, contact your primary care doctor.   -------------------- Preventing Falls at  Home  Falls are common, often dreaded events in the lives of older people. Aside from the obvious injuries and even death that may result, fall can cause wide-ranging consequences including loss of independence, mental decline, decreased activity and mobility. Younger people are also at risk of falling, especially those with chronic illnesses and fatigue.  Ways to reduce  risk for falling Examine diet and medications. Warm foods and alcohol dilate blood vessels, which can lead to dizziness when standing. Sleep aids, antidepressants and pain medications can also increase the likelihood of a fall.  Get a vision exam. Poor vision, cataracts and glaucoma increase the chances of falling.  Check foot gear. Shoes should fit snugly and have a sturdy, nonskid sole and a broad, low heel  Participate in a physician-approved exercise program to build and maintain muscle strength and improve balance and coordination. Programs that use ankle weights or stretch bands are excellent for muscle-strengthening. Water aerobics programs and low-impact Tai Chi programs have also been shown to improve balance and coordination.  Increase vitamin D intake. Vitamin D improves muscle strength and increases the amount of calcium the body is able to absorb and deposit in bones.  How to prevent falls from common hazards Floors - Remove all loose wires, cords, and throw rugs. Minimize clutter. Make sure rugs are anchored and smooth. Keep furniture in its usual place.  Chairs -- Use chairs with straight backs, armrests and firm seats. Add firm cushions to existing pieces to add height.  Bathroom - Install grab bars and non-skid tape in the tub or shower. Use a bathtub transfer bench or a shower chair with a back support Use an elevated toilet seat and/or safety rails to assist standing from a low surface. Do not use towel racks or bathroom tissue holders to help you stand.  Lighting - Make sure halls, stairways, and entrances are well-lit. Install a night light in your bathroom or hallway. Make sure there is a light switch at the top and bottom of the staircase. Turn lights on if you get up in the middle of the night. Make sure lamps or light switches are within reach of the bed if you have to get up during the night.  Kitchen - Install non-skid rubber mats near the sink and stove. Clean spills  immediately. Store frequently used utensils, pots, pans between waist and eye level. This helps prevent reaching and bending. Sit when getting things out of lower cupboards.  Living room/ Bedrooms - Place furniture with wide spaces in between, giving enough room to move around. Establish a route through the living room that gives you something to hold onto as you walk.  Stairs - Make sure treads, rails, and rugs are secure. Install a rail on both sides of the stairs. If stairs are a threat, it might be helpful to arrange most of your activities on the lower level to reduce the number of times you must climb the stairs.  Entrances and doorways - Install metal handles on the walls adjacent to the doorknobs of all doors to make it more secure as you travel through the doorway.  Tips for maintaining balance Keep at least one hand free at all times. Try using a backpack or fanny pack to hold things rather than carrying them in your hands. Never carry objects in both hands when walking as this interferes with keeping your balance.  Attempt to swing both arms from front to back while walking. This might require a conscious effort if  Parkinson's disease has diminished your movement. It will, however, help you to maintain balance and posture, and reduce fatigue.  Consciously lift your feet off of the ground when walking. Shuffling and dragging of the feet is a common culprit in losing your balance.  When trying to navigate turns, use a "U" technique of facing forward and making a wide turn, rather than pivoting sharply.  Try to stand with your feet shoulder-length apart. When your feet are close together for any length of time, you increase your risk of losing your balance and falling.  Do one thing at a time. Don't try to walk and accomplish another task, such as reading or looking around. The decrease in your automatic reflexes complicates motor function, so the less distraction, the better.  Do not  wear rubber or gripping soled shoes, they might "catch" on the floor and cause tripping.  Move slowly when changing positions. Use deliberate, concentrated movements and, if needed, use a grab bar or walking aid. Count 15 seconds between each movement. For example, when rising from a seated position, wait 15 seconds after standing to begin walking.  If balance is a continuous problem, you might want to consider a walking aid such as a cane, walking stick, or walker. Once you've mastered walking with help, you might be ready to try it on your own again.

## 2021-11-03 LAB — VITAMIN B12: Vitamin B-12: 476 pg/mL (ref 211–911)

## 2021-11-08 ENCOUNTER — Encounter: Payer: Self-pay | Admitting: Neurology

## 2021-11-08 LAB — PROTEIN ELECTROPHORESIS, SERUM
Albumin ELP: 4.1 g/dL (ref 3.8–4.8)
Alpha 1: 0.3 g/dL (ref 0.2–0.3)
Alpha 2: 1 g/dL — ABNORMAL HIGH (ref 0.5–0.9)
Beta 2: 0.5 g/dL (ref 0.2–0.5)
Beta Globulin: 0.5 g/dL (ref 0.4–0.6)
Gamma Globulin: 1.4 g/dL (ref 0.8–1.7)
Total Protein: 7.7 g/dL (ref 6.1–8.1)

## 2021-11-08 LAB — IMMUNOFIXATION ELECTROPHORESIS
IgG (Immunoglobin G), Serum: 1411 mg/dL (ref 600–1540)
IgM, Serum: 79 mg/dL (ref 50–300)
Immunofix Electr Int: NOT DETECTED
Immunoglobulin A: 303 mg/dL (ref 70–320)

## 2021-11-21 ENCOUNTER — Encounter: Payer: Self-pay | Admitting: *Deleted

## 2021-11-22 ENCOUNTER — Telehealth: Payer: Self-pay | Admitting: Family Medicine

## 2021-11-22 NOTE — Telephone Encounter (Signed)
Pt stated she received a shipment of Amiclear. She was asking if we had sent it. She is not sure where it came from. I advised pt to not take it until she has confirmed who sent it to her. She wanted Jerline Pain to know about this.

## 2021-11-25 NOTE — Telephone Encounter (Signed)
Amiclear not on current med list. I will advise patient to get rid of medication

## 2021-11-28 ENCOUNTER — Ambulatory Visit (INDEPENDENT_AMBULATORY_CARE_PROVIDER_SITE_OTHER): Payer: No Typology Code available for payment source | Admitting: Neurology

## 2021-11-28 ENCOUNTER — Telehealth: Payer: Self-pay | Admitting: Neurology

## 2021-11-28 DIAGNOSIS — R251 Tremor, unspecified: Secondary | ICD-10-CM

## 2021-11-28 DIAGNOSIS — E1142 Type 2 diabetes mellitus with diabetic polyneuropathy: Secondary | ICD-10-CM | POA: Diagnosis not present

## 2021-11-28 DIAGNOSIS — R269 Unspecified abnormalities of gait and mobility: Secondary | ICD-10-CM

## 2021-11-28 DIAGNOSIS — R209 Unspecified disturbances of skin sensation: Secondary | ICD-10-CM

## 2021-11-28 NOTE — Telephone Encounter (Signed)
Discussed the results of EMG after procedure today. EMG was consistent with a generalized sensorimotor polyneuropathy, most likely from diabetes. Patient will follow up with me in March 2023.  All questions were answered.  Kai Levins, MD [facility]

## 2021-11-28 NOTE — Procedures (Signed)
[facility]  [place], [address]  [place], [place] [address] Tel: [phone] Fax: [phone] Test Date:  11/28/2021  Patient: Kristy Trujillo DOB: 01/28/1951 Physician: Kai Levins, MD  Sex: Female Height: '5\' 2"'$  Ref Phys: Kai Levins, MD  ID#: [id number]   Technician:    History: This is a 71 year old female with imbalance and tingling in bilateral feet.  NCV & EMG Findings: Extensive electrodiagnostic evaluation of the right lower limb with additional nerve conduction studies of the left lower limb shows: Absent sural and superficial fibular sensory responses bilaterally. Bilateral tibial (AH) motor responses show reduced amplitude (L3.9, R3.1 mV). Right fibular (EDB) motor response is within normal limits. H reflex studies are absent bilaterally. Chronic motor axon loss changes without accompanying active denervation are seen in the right flexor digitorum longus and the right medial head of the gastrocnemius muscles.  Impression: This is an abnormal evaluation of the right upper extremity. The findings are most consistent with a generalized sensorimotor polyneuropathy, axon loss in type, moderate in degree electrically. There is no electrodiagnostic evidence of a right lumbosacral (L3-S1) motor radiculopathy.    ___________________________ Kai Levins, MD    Nerve Conduction Studies Motor Nerve Results    Latency Amplitude F-Lat Segment Distance CV Comment  Site (ms) Norm (mV) Norm (ms)  (cm) (m/s) Norm   Right Fibular (EDB) Motor  Ankle 5.3  < 6.0 3.1  > 2.5        Bel fib head 12.6 - 2.7 -  Bel fib head-Ankle 29 40  > 40   Pop fossa 14.6 - 2.6 -  Pop fossa-Bel fib head 8 40 -   Left Tibial (AH) Motor  Ankle 5.5  < 6.0 *3.9  > 4.0        Knee 15.2 - 2.5 -  Knee-Ankle 38.5 40  > 40   Right Tibial (AH) Motor  Ankle 5.0  < 6.0 *3.1  > 4.0        Knee 14.5 - 1.90 -  Knee-Ankle 38 40  > 40    Sensory Sites    Neg Peak Lat Amplitude (O-P) Segment  Distance Velocity Comment  Site (ms) Norm (V) Norm  (cm) (ms)   Left Superficial Fibular Sensory  14 cm-Ankle *NR  < 4.6 *NR  > 3 14 cm-Ankle 14    Right Superficial Fibular Sensory  14 cm-Ankle *NR  < 4.6 *NR  > 3 14 cm-Ankle 14    Left Sural Sensory  Calf-Lat mall *NR  < 4.6 *NR  > 3 Calf-Lat mall 14    Right Sural Sensory  Calf-Lat mall *NR  < 4.6 *NR  > 3 Calf-Lat mall 14     H-Reflex Results    M-Lat H Lat H Neg Amp H-M Lat  Site (ms) (ms) Norm (mV) (ms)  Left Tibial H-Reflex  Pop fossa 7.2 NR  < 35.0 NR -  Right Tibial H-Reflex  Pop fossa 6.3 NR  < 35.0 NR -   Electromyography   Side Muscle Ins.Act Fibs Fasc Recrt Amp Dur Poly Activation Comment  Right Tib ant Nml Nml Nml Nml Nml Nml Nml Nml N/A  Right Gastroc MH Nml Nml Nml *1- *1+ *1+ Nml *Variable N/A  Right FDL Nml Nml Nml *2- *1+ *1+ *1+ Nml N/A  Right AH *1+ Nml Nml *N.E. - - - *N.E. N/A  Right Rectus fem Nml Nml Nml Nml Nml Nml Nml Nml N/A  Right Gluteus med Nml  Nml Nml Nml Nml Nml Nml Nml N/A      Waveforms:  Motor        Sensory           H-Reflex

## 2021-11-30 ENCOUNTER — Telehealth: Payer: Self-pay | Admitting: Neurology

## 2021-11-30 NOTE — Telephone Encounter (Signed)
Pt said she needs a call from Dr.Hill to explain her results from her EMG.

## 2021-11-30 NOTE — Telephone Encounter (Signed)
Called patient back to explain the results of her EMG. It showed a neuropathy, likely secondary to diabetes.  All questions were answered.  Kai Levins, MD [facility]

## 2021-12-07 ENCOUNTER — Ambulatory Visit: Payer: No Typology Code available for payment source | Admitting: Neurology

## 2021-12-09 ENCOUNTER — Other Ambulatory Visit: Payer: Self-pay | Admitting: Family Medicine

## 2021-12-10 ENCOUNTER — Other Ambulatory Visit: Payer: Self-pay | Admitting: Family Medicine

## 2021-12-14 ENCOUNTER — Telehealth: Payer: Self-pay | Admitting: *Deleted

## 2021-12-14 NOTE — Telephone Encounter (Signed)
Key: [id number] - PA Case ID: [id number] - Rx #: [id number] Status Sent to Plan today Drug hydrOXYzine HCl '25MG'$  tablets Waiting for determination

## 2021-12-14 NOTE — Telephone Encounter (Signed)
This approval authorizes your coverage from 09/15/2021 - 12/14/2022, Pharmacy notified

## 2021-12-15 ENCOUNTER — Ambulatory Visit (INDEPENDENT_AMBULATORY_CARE_PROVIDER_SITE_OTHER): Payer: No Typology Code available for payment source | Admitting: Family Medicine

## 2021-12-15 VITALS — BP 132/80 | HR 84 | Temp 98.2°F | Ht 62.0 in | Wt 158.6 lb

## 2021-12-15 DIAGNOSIS — E1159 Type 2 diabetes mellitus with other circulatory complications: Secondary | ICD-10-CM | POA: Diagnosis not present

## 2021-12-15 DIAGNOSIS — F325 Major depressive disorder, single episode, in full remission: Secondary | ICD-10-CM | POA: Diagnosis not present

## 2021-12-15 DIAGNOSIS — F419 Anxiety disorder, unspecified: Secondary | ICD-10-CM

## 2021-12-15 DIAGNOSIS — N183 Chronic kidney disease, stage 3 unspecified: Secondary | ICD-10-CM

## 2021-12-15 DIAGNOSIS — Z1211 Encounter for screening for malignant neoplasm of colon: Secondary | ICD-10-CM

## 2021-12-15 DIAGNOSIS — E785 Hyperlipidemia, unspecified: Secondary | ICD-10-CM | POA: Diagnosis not present

## 2021-12-15 DIAGNOSIS — E1169 Type 2 diabetes mellitus with other specified complication: Secondary | ICD-10-CM

## 2021-12-15 DIAGNOSIS — I152 Hypertension secondary to endocrine disorders: Secondary | ICD-10-CM

## 2021-12-15 DIAGNOSIS — E119 Type 2 diabetes mellitus without complications: Secondary | ICD-10-CM

## 2021-12-15 LAB — COMPREHENSIVE METABOLIC PANEL
ALT: 34 U/L (ref 0–35)
AST: 30 U/L (ref 0–37)
Albumin: 4.2 g/dL (ref 3.5–5.2)
Alkaline Phosphatase: 128 U/L — ABNORMAL HIGH (ref 39–117)
BUN: 48 mg/dL — ABNORMAL HIGH (ref 6–23)
CO2: 24 mEq/L (ref 19–32)
Calcium: 9.2 mg/dL (ref 8.4–10.5)
Chloride: 98 mEq/L (ref 96–112)
Creatinine, Ser: 1.85 mg/dL — ABNORMAL HIGH (ref 0.40–1.20)
GFR: 26.93 mL/min — ABNORMAL LOW (ref >60.00)
Glucose, Bld: 247 mg/dL — ABNORMAL HIGH (ref 70–99)
Potassium: 3.8 mEq/L (ref 3.5–5.1)
Sodium: 134 mEq/L — ABNORMAL LOW (ref 135–145)
Total Bilirubin: 0.5 mg/dL (ref 0.2–1.2)
Total Protein: 7.2 g/dL (ref 6.0–8.3)

## 2021-12-15 LAB — CBC
HCT: 35.9 % — ABNORMAL LOW (ref 36.0–46.0)
Hemoglobin: 11.8 g/dL — ABNORMAL LOW (ref 12.0–15.0)
MCHC: 32.9 g/dL (ref 30.0–36.0)
MCV: 84.9 fl (ref 78.0–100.0)
Platelets: 299 10*3/uL (ref 150.0–400.0)
RBC: 4.22 Mil/uL (ref 3.87–5.11)
RDW: 13 % (ref 11.5–15.5)
WBC: 10.7 10*3/uL — ABNORMAL HIGH (ref 4.0–10.5)

## 2021-12-15 LAB — LIPID PANEL
Cholesterol: 119 mg/dL (ref 0–200)
HDL: 58.3 mg/dL (ref >39.00)
LDL Cholesterol: 42 mg/dL (ref 0–99)
NonHDL: 60.91
Total CHOL/HDL Ratio: 2
Triglycerides: 96 mg/dL (ref 0.0–149.0)
VLDL: 19.2 mg/dL (ref 0.0–40.0)

## 2021-12-15 LAB — HEMOGLOBIN A1C: Hgb A1c MFr Bld: 7.8 % — ABNORMAL HIGH (ref 4.6–6.5)

## 2021-12-15 LAB — TSH: TSH: 1.84 u[IU]/mL (ref 0.35–5.50)

## 2021-12-15 MED ORDER — HYDROCHLOROTHIAZIDE 25 MG PO TABS: 25.0000 mg | ORAL_TABLET | Freq: Every day | ORAL | 3 refills | Status: DC

## 2021-12-15 NOTE — Assessment & Plan Note (Signed)
Check CMET. 

## 2021-12-15 NOTE — Assessment & Plan Note (Signed)
At goal.  We will refill HCTZ 25 mg daily.  Check labs.

## 2021-12-15 NOTE — Progress Notes (Signed)
   Kristy Trujillo is a 71 y.o. female who presents today for an office visit.  Assessment/Plan:  Chronic Problems Addressed Today: Diabetes mellitus without complication (HCC) Check A1c today.  Not currently on any medications.  We discussed lifestyle modifications.  Follow-up in 3 to 6 months depending on results of A1c.  Dyslipidemia associated with type 2 diabetes mellitus (HCC) Check lipids.  She is on Lipitor 40 mg daily.  Tolerating well.  No significant side effects.  CKD (chronic kidney disease) stage 3, GFR 30-59 ml/min (HCC) Check CMET.   Hypertension associated with diabetes (Kristy Trujillo) At goal.  We will refill HCTZ 25 mg daily.  Check labs.  Anxiety On Prozac 40 mg daily and clonazepam 1 mg twice daily as needed.  Doing well with this combination.  No significant side effects.  No SI or HI.  Depression, major, in remission (Kristy Trujillo) Doing well on Prozac 40 mg daily.  Refer for colonoscopy today.  She will check with insurance about getting shingles vaccine at the pharmacy.    Subjective:  HPI:  See A/p for status of chronic conditions.        Objective:  Physical Exam: BP 132/80   Pulse 84   Temp 98.2 F (36.8 C) (Temporal)   Ht '5\' 2"'$  (1.575 m)   Wt 158 lb 9.6 oz (71.9 kg)   SpO2 100%   BMI 29.01 kg/m   Gen: No acute distress, resting comfortably CV: Regular rate and rhythm with no murmurs appreciated Pulm: Normal work of breathing, clear to auscultation bilaterally with no crackles, wheezes, or rhonchi Neuro: Grossly normal, moves all extremities Psych: Normal affect and thought content      Kristy Espinoza M. Kristy Pain, MD 12/15/2021 12:52 PM

## 2021-12-15 NOTE — Patient Instructions (Signed)
It was very nice to see you today!  We will check blood work today.  Keep an eye on your blood pressure let me know if it is persistently elevated at home.  We will refer you for colonoscopy.  We will see you back in 3 to 6 months depending on the results of your blood work.  Please come back to see [place] sooner if needed.  Take care, Dr Jerline Pain  PLEASE NOTE:  If you had any lab tests please let us know if you have not heard back within a few days. You may see your results on mychart before we have a chance to review them but we will give you a call once they are reviewed by [place]. If we ordered any referrals today, please let us know if you have not heard from their office within the next week.   Please try these tips to maintain a healthy lifestyle:  Eat at least 3 REAL meals and 1-2 snacks per day.  Aim for no more than 5 hours between eating.  If you eat breakfast, please do so within one hour of getting up.   Each meal should contain half fruits/vegetables, one quarter protein, and one quarter carbs (no bigger than a computer mouse)  Cut down on sweet beverages. This includes juice, soda, and sweet tea.   Drink at least 1 glass of water with each meal and aim for at least 8 glasses per day  Exercise at least 150 minutes every week.

## 2021-12-15 NOTE — Assessment & Plan Note (Signed)
Doing well on Prozac 40 mg daily. 

## 2021-12-15 NOTE — Assessment & Plan Note (Addendum)
Check A1c today.  Not currently on any medications.  We discussed lifestyle modifications.  Follow-up in 3 to 6 months depending on results of A1c.

## 2021-12-15 NOTE — Assessment & Plan Note (Signed)
Check lipids.  She is on Lipitor 40 mg daily.  Tolerating well.  No significant side effects.

## 2021-12-15 NOTE — Assessment & Plan Note (Signed)
On Prozac 40 mg daily and clonazepam 1 mg twice daily as needed.  Doing well with this combination.  No significant side effects.  No SI or HI.

## 2021-12-22 ENCOUNTER — Other Ambulatory Visit: Payer: Self-pay | Admitting: *Deleted

## 2021-12-22 DIAGNOSIS — R748 Abnormal levels of other serum enzymes: Secondary | ICD-10-CM

## 2021-12-22 NOTE — Progress Notes (Signed)
Please inform patient of the following:  A1c is up a bit.  Recommend we restart Rybelsus to improve her numbers.  Please send in Rybelsus 3 mg daily if she is willing to start.  Her kidney numbers are elevated compared to last time.  Looks like she may be dehydrated.  Please make sure that she is getting plenty of fluid.  Please have her come back in 1 to 2 weeks to recheck CMET and CBC.  We should see her back in 3 months to recheck her A1c.

## 2021-12-28 ENCOUNTER — Telehealth: Payer: Self-pay | Admitting: Family Medicine

## 2021-12-28 NOTE — Telephone Encounter (Addendum)
Caller states: -Their records show Rx was filled in October for quantity of 60. -feedback from patient is that she did not pick up the medicaiton.   Caller Asks: -Does PCP team want to allow patient to fill Rx early?   Caller requests: -return call with determination  clonazePAM (KLONOPIN) 1 MG tablet [[id number]]

## 2021-12-29 ENCOUNTER — Telehealth: Payer: Self-pay

## 2021-12-29 ENCOUNTER — Other Ambulatory Visit (INDEPENDENT_AMBULATORY_CARE_PROVIDER_SITE_OTHER): Payer: No Typology Code available for payment source

## 2021-12-29 DIAGNOSIS — R748 Abnormal levels of other serum enzymes: Secondary | ICD-10-CM | POA: Diagnosis not present

## 2021-12-29 DIAGNOSIS — F419 Anxiety disorder, unspecified: Secondary | ICD-10-CM

## 2021-12-29 LAB — CBC
HCT: 34.4 % — ABNORMAL LOW (ref 36.0–46.0)
Hemoglobin: 11.6 g/dL — ABNORMAL LOW (ref 12.0–15.0)
MCHC: 33.8 g/dL (ref 30.0–36.0)
MCV: 84.6 fl (ref 78.0–100.0)
Platelets: 326 10*3/uL (ref 150.0–400.0)
RBC: 4.07 Mil/uL (ref 3.87–5.11)
RDW: 13.3 % (ref 11.5–15.5)
WBC: 8.6 10*3/uL (ref 4.0–10.5)

## 2021-12-29 LAB — COMPREHENSIVE METABOLIC PANEL
ALT: 16 U/L (ref 0–35)
AST: 19 U/L (ref 0–37)
Albumin: 3.8 g/dL (ref 3.5–5.2)
Alkaline Phosphatase: 117 U/L (ref 39–117)
BUN: 43 mg/dL — ABNORMAL HIGH (ref 6–23)
CO2: 25 mEq/L (ref 19–32)
Calcium: 9.2 mg/dL (ref 8.4–10.5)
Chloride: 106 mEq/L (ref 96–112)
Creatinine, Ser: 1.34 mg/dL — ABNORMAL HIGH (ref 0.40–1.20)
GFR: 39.64 mL/min — ABNORMAL LOW (ref >60.00)
Glucose, Bld: 167 mg/dL — ABNORMAL HIGH (ref 70–99)
Potassium: 4.1 mEq/L (ref 3.5–5.1)
Sodium: 140 mEq/L (ref 135–145)
Total Bilirubin: 0.2 mg/dL (ref 0.2–1.2)
Total Protein: 6.7 g/dL (ref 6.0–8.3)

## 2021-12-29 MED ORDER — GABAPENTIN 300 MG PO CAPS: 300.0000 mg | ORAL_CAPSULE | Freq: Three times a day (TID) | ORAL | 0 refills | Status: DC | PRN

## 2021-12-29 NOTE — Telephone Encounter (Signed)
Please advise 

## 2021-12-29 NOTE — Telephone Encounter (Signed)
Patient is agreeable to gabapentin until she is able to fill her klonopin.

## 2021-12-29 NOTE — Telephone Encounter (Signed)
[facility] states that patient picked up medication on 12/09/21, patient states that she has not picked up medication.   Patient and her sister went to [place] on 12/09/21 and patient got very sick and was throwing up, doesn't really remember the trip to [facility] and is insistent that she did not fill medication that day.  [facility] states that patient can fill on 01/06/22.   Supposedly has been out of medication for over a month and has not slept in the past three days. Wanting to know if a short fill can be sent in or anything at this point to get her thru to her fill on 01/06/22.

## 2021-12-29 NOTE — Telephone Encounter (Signed)
If sleep is the main problem, along w/anxiety, Gabapentin may help her - she used to take for neuropathy, but it also helps with anxiety and causes drowsiness. Or, she can take 2 of her Hydroxyzine at bedtime for sleep,and 1 pill bid during day as this is also for anxiety.  Let me know what she wants to do.

## 2021-12-29 NOTE — Addendum Note (Signed)
Addended byJeanie Sewer on: 12/29/2021 10:41 PM   Modules accepted: Orders

## 2022-01-03 NOTE — Progress Notes (Signed)
Please inform patient of the following:  Kidney numbers are back to baseline.  She should continue make sure that she is getting plenty of fluids and we can recheck at her next office visit.  Her white blood cell counts are back to normal.  We can recheck next office visit.

## 2022-01-06 NOTE — Telephone Encounter (Signed)
See follow up in 11/02 phone note

## 2022-01-12 DIAGNOSIS — H25013 Cortical age-related cataract, bilateral: Secondary | ICD-10-CM | POA: Diagnosis not present

## 2022-01-12 DIAGNOSIS — H25043 Posterior subcapsular polar age-related cataract, bilateral: Secondary | ICD-10-CM | POA: Diagnosis not present

## 2022-01-12 DIAGNOSIS — H2513 Age-related nuclear cataract, bilateral: Secondary | ICD-10-CM | POA: Diagnosis not present

## 2022-01-12 DIAGNOSIS — H52203 Unspecified astigmatism, bilateral: Secondary | ICD-10-CM | POA: Diagnosis not present

## 2022-01-12 DIAGNOSIS — H3589 Other specified retinal disorders: Secondary | ICD-10-CM | POA: Diagnosis not present

## 2022-01-12 DIAGNOSIS — H5203 Hypermetropia, bilateral: Secondary | ICD-10-CM | POA: Diagnosis not present

## 2022-01-12 DIAGNOSIS — H35043 Retinal micro-aneurysms, unspecified, bilateral: Secondary | ICD-10-CM | POA: Diagnosis not present

## 2022-01-12 DIAGNOSIS — H43813 Vitreous degeneration, bilateral: Secondary | ICD-10-CM | POA: Diagnosis not present

## 2022-01-12 DIAGNOSIS — E113313 Type 2 diabetes mellitus with moderate nonproliferative diabetic retinopathy with macular edema, bilateral: Secondary | ICD-10-CM | POA: Diagnosis not present

## 2022-01-12 DIAGNOSIS — H524 Presbyopia: Secondary | ICD-10-CM | POA: Diagnosis not present

## 2022-01-12 DIAGNOSIS — H04123 Dry eye syndrome of bilateral lacrimal glands: Secondary | ICD-10-CM | POA: Diagnosis not present

## 2022-01-25 DIAGNOSIS — E11311 Type 2 diabetes mellitus with unspecified diabetic retinopathy with macular edema: Secondary | ICD-10-CM | POA: Diagnosis not present

## 2022-01-25 DIAGNOSIS — H2513 Age-related nuclear cataract, bilateral: Secondary | ICD-10-CM | POA: Diagnosis not present

## 2022-01-25 DIAGNOSIS — H25813 Combined forms of age-related cataract, bilateral: Secondary | ICD-10-CM | POA: Diagnosis not present

## 2022-01-25 DIAGNOSIS — E113313 Type 2 diabetes mellitus with moderate nonproliferative diabetic retinopathy with macular edema, bilateral: Secondary | ICD-10-CM | POA: Diagnosis not present

## 2022-01-25 DIAGNOSIS — E1136 Type 2 diabetes mellitus with diabetic cataract: Secondary | ICD-10-CM | POA: Diagnosis not present

## 2022-01-25 DIAGNOSIS — H02831 Dermatochalasis of right upper eyelid: Secondary | ICD-10-CM | POA: Diagnosis not present

## 2022-01-25 DIAGNOSIS — H02834 Dermatochalasis of left upper eyelid: Secondary | ICD-10-CM | POA: Diagnosis not present

## 2022-01-26 ENCOUNTER — Telehealth: Payer: Self-pay | Admitting: *Deleted

## 2022-01-26 NOTE — Telephone Encounter (Signed)
She should keep an eye on her blood pressure for a few days and check in with [place] if it is still elevated.  100/68 is at goal.  She should not double up her blood pressure pills based of a single reading. '50mg'$  is too high of a dose and can cause kidney and electrolyte issues.  Algis Greenhouse. Jerline Pain, MD 01/26/2022 2:58 PM

## 2022-01-26 NOTE — Telephone Encounter (Signed)
BP yesterday 100/68 Today at the dentist appointment was 120/100 Patient stated took two pill Rx hydrochlorothiazide '25mg'$   Advise to monitored BP for a few days continue taking medication as prescribed  Patient stated will continue to take two pills Hydrochlorothiazide since the dentist told her BP is elevated and was unable to work on her. Please advise

## 2022-01-26 NOTE — Telephone Encounter (Signed)
See note

## 2022-02-13 ENCOUNTER — Other Ambulatory Visit: Payer: Self-pay | Admitting: Family

## 2022-02-13 DIAGNOSIS — F419 Anxiety disorder, unspecified: Secondary | ICD-10-CM

## 2022-02-13 DIAGNOSIS — E11311 Type 2 diabetes mellitus with unspecified diabetic retinopathy with macular edema: Secondary | ICD-10-CM | POA: Diagnosis not present

## 2022-03-02 ENCOUNTER — Other Ambulatory Visit: Payer: Self-pay | Admitting: Family Medicine

## 2022-03-15 DIAGNOSIS — H25813 Combined forms of age-related cataract, bilateral: Secondary | ICD-10-CM | POA: Diagnosis not present

## 2022-03-15 DIAGNOSIS — E1136 Type 2 diabetes mellitus with diabetic cataract: Secondary | ICD-10-CM | POA: Diagnosis not present

## 2022-03-15 DIAGNOSIS — E11311 Type 2 diabetes mellitus with unspecified diabetic retinopathy with macular edema: Secondary | ICD-10-CM | POA: Diagnosis not present

## 2022-03-15 DIAGNOSIS — H04123 Dry eye syndrome of bilateral lacrimal glands: Secondary | ICD-10-CM | POA: Diagnosis not present

## 2022-03-15 DIAGNOSIS — E113313 Type 2 diabetes mellitus with moderate nonproliferative diabetic retinopathy with macular edema, bilateral: Secondary | ICD-10-CM | POA: Diagnosis not present

## 2022-03-15 DIAGNOSIS — H2513 Age-related nuclear cataract, bilateral: Secondary | ICD-10-CM | POA: Diagnosis not present

## 2022-03-16 ENCOUNTER — Other Ambulatory Visit: Payer: Self-pay | Admitting: Family Medicine

## 2022-03-16 ENCOUNTER — Other Ambulatory Visit: Payer: Self-pay | Admitting: Family

## 2022-03-16 DIAGNOSIS — F419 Anxiety disorder, unspecified: Secondary | ICD-10-CM

## 2022-04-01 IMAGING — US US BREAST*R* LIMITED INC AXILLA
1 series · 2 of 2 positions shown · non-contrast
Comparison: None.

CLINICAL DATA: Palpable lump in the right breast

EXAM:
DIGITAL DIAGNOSTIC BILATERAL MAMMOGRAM WITH CAD AND TOMO
ULTRASOUND RIGHT BREAST

[Series 1: us breast*right* limited inc axilla · 0.06mm/px · 2 of 2 slices shown]
[im 1/2]
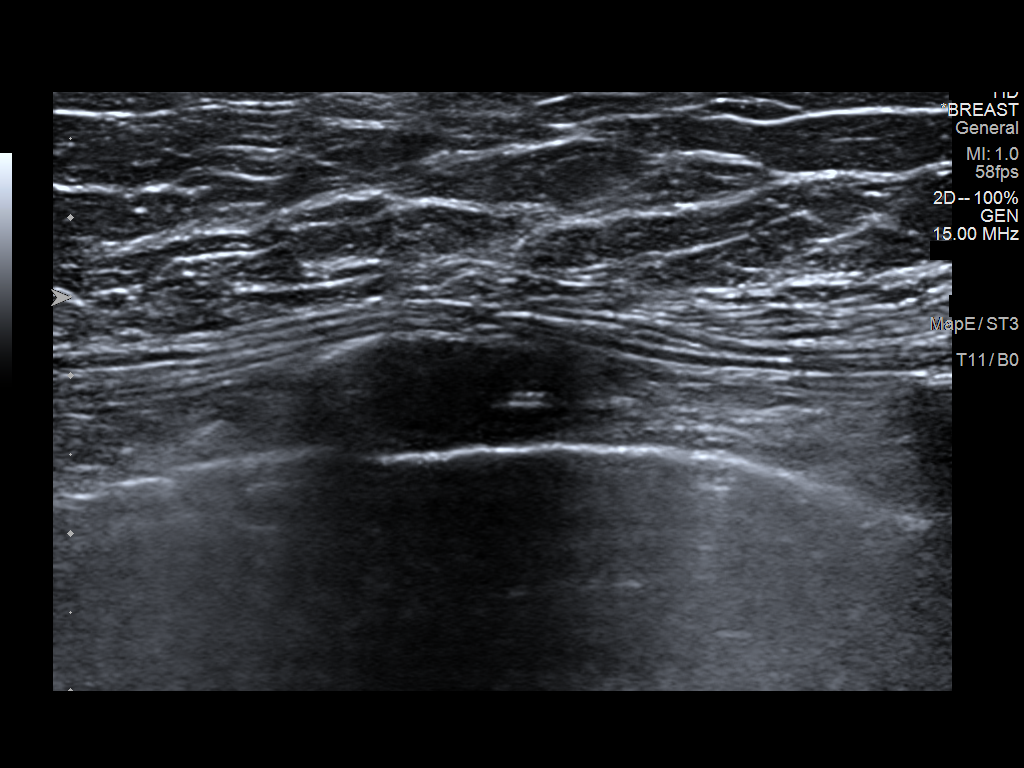
[im 2/2]
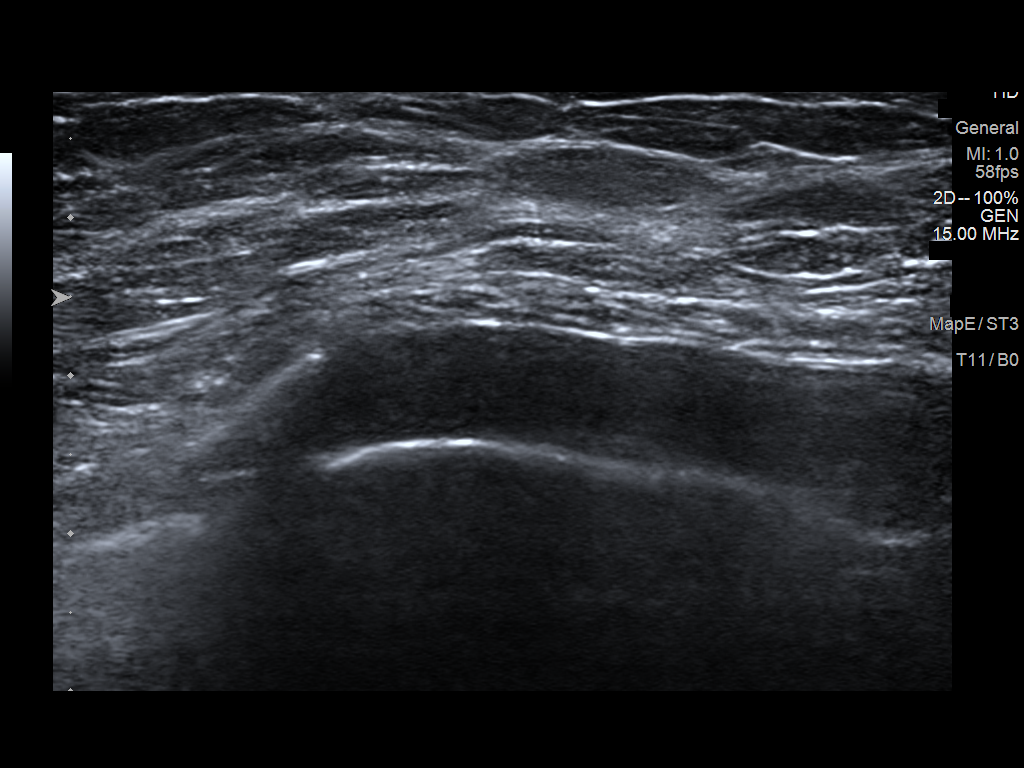

[2 of 2 positions shown; findings below may reference images not displayed]

ACR Breast Density Category b: There are scattered areas of
fibroglandular density.
FINDINGS: No suspicious masses, calcifications, or distortion are identified
in either breast.

Mammographic images were processed with CAD.

On physical exam, no suspicious lumps are identified.

Targeted ultrasound is performed, showing no sonographic evidence of
malignancy in the region of the patient's palpable lump.
IMPRESSION: No mammographic or sonographic evidence of malignancy.

RECOMMENDATION:
Treatment of the patient's symptoms should be based on clinical and
physical exam given lack of imaging findings. Recommend annual
screening mammography.

I have discussed the findings and recommendations with the patient.
If applicable, a reminder letter will be sent to the patient
regarding the next appointment.

BI-RADS CATEGORY  2: Benign.

## 2022-04-01 IMAGING — MG DIGITAL DIAGNOSTIC BILAT W/ TOMO W/ CAD
6 of 10 series · 6 of 30 positions shown · non-contrast
Comparison: None.

CLINICAL DATA: Palpable lump in the right breast

EXAM:
DIGITAL DIAGNOSTIC BILATERAL MAMMOGRAM WITH CAD AND TOMO
ULTRASOUND RIGHT BREAST

[R TAN synth-2D]
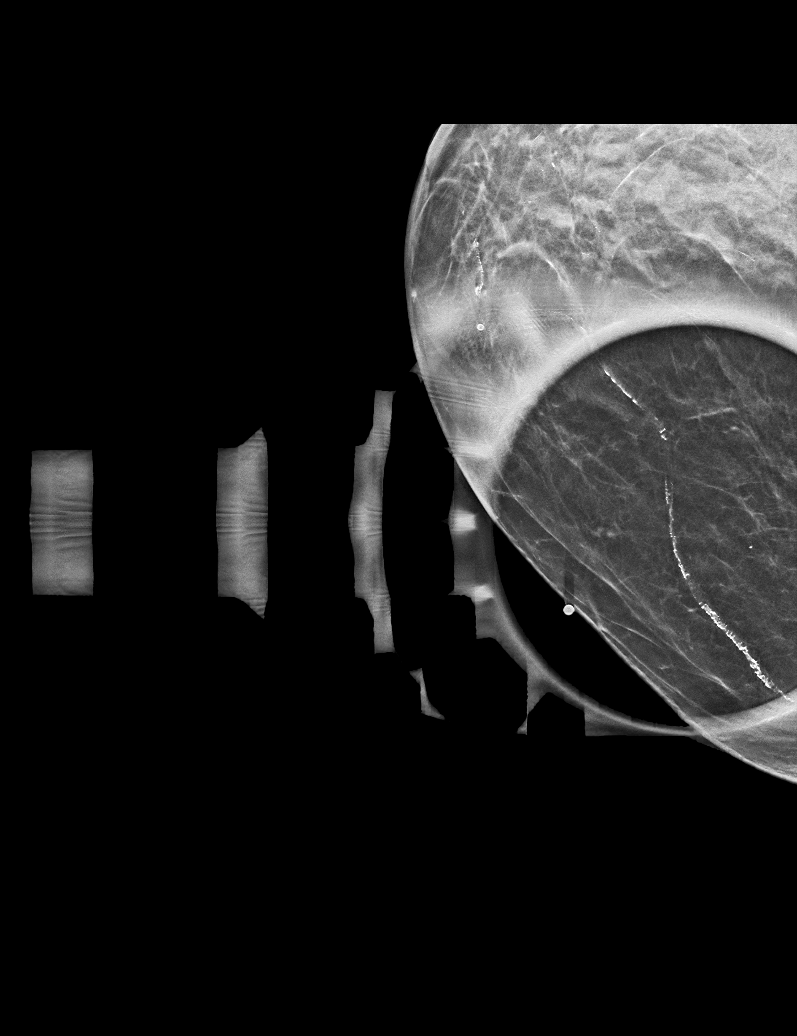

[L MLO synth-2D]
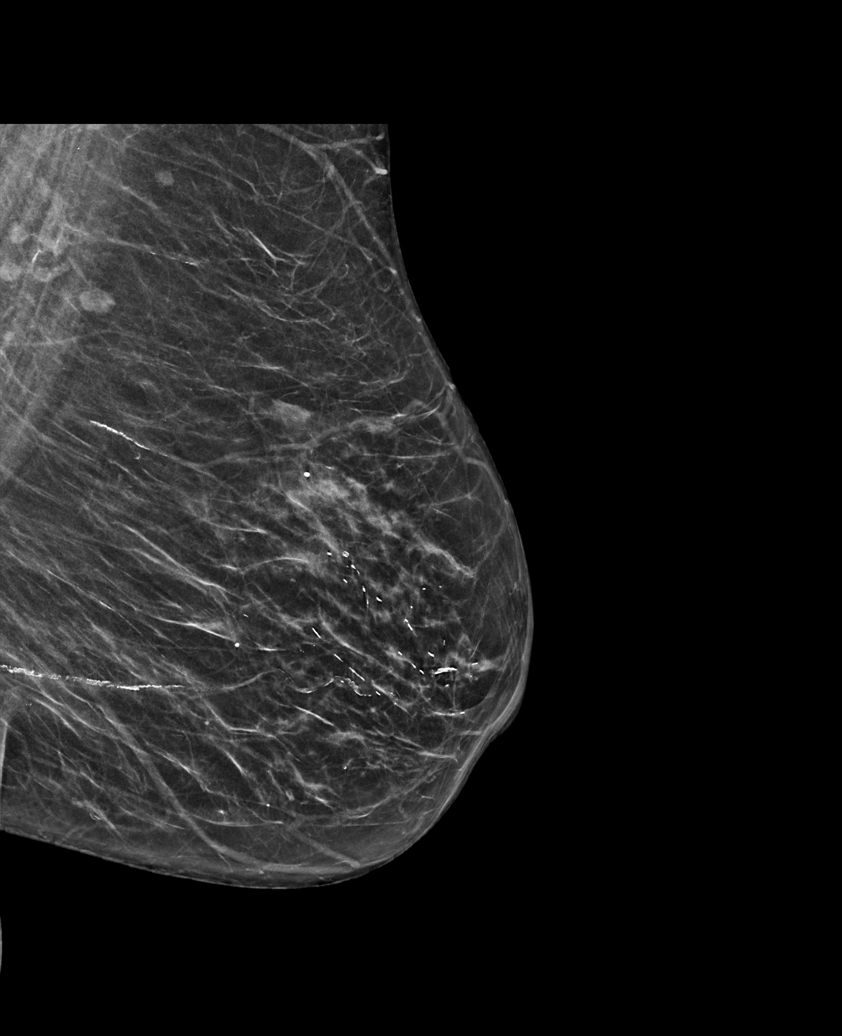

[L CC synth-2D]
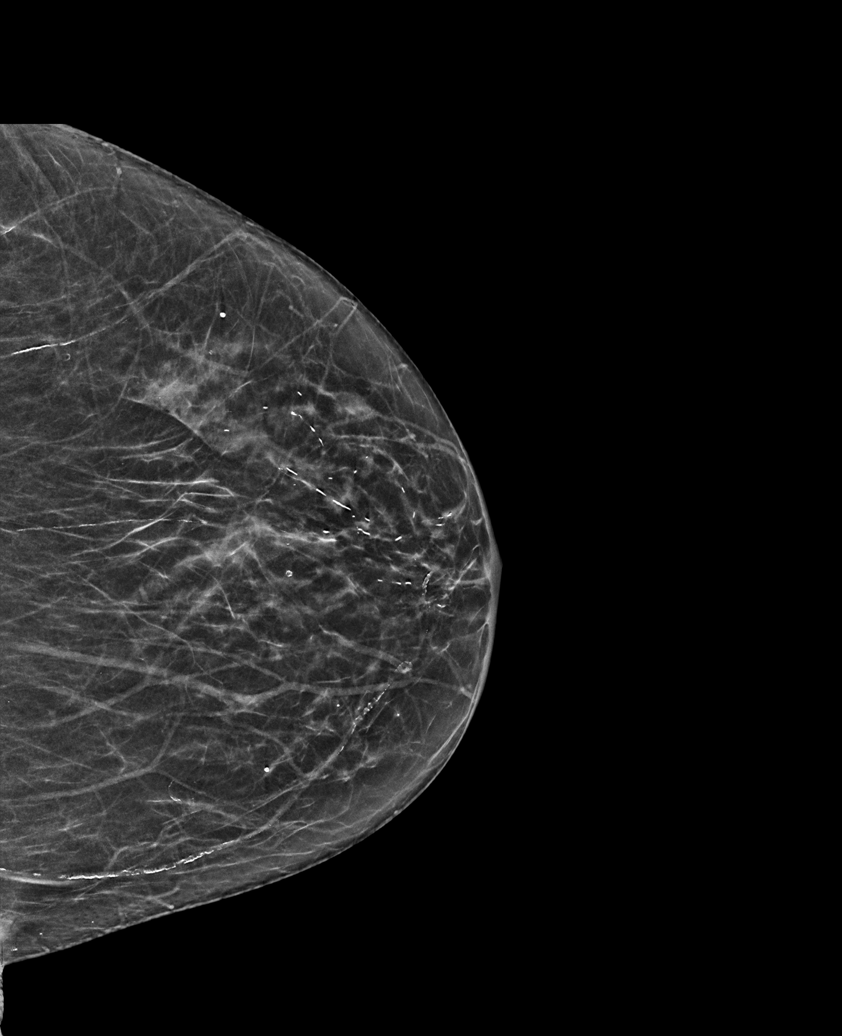

[R CC synth-2D]
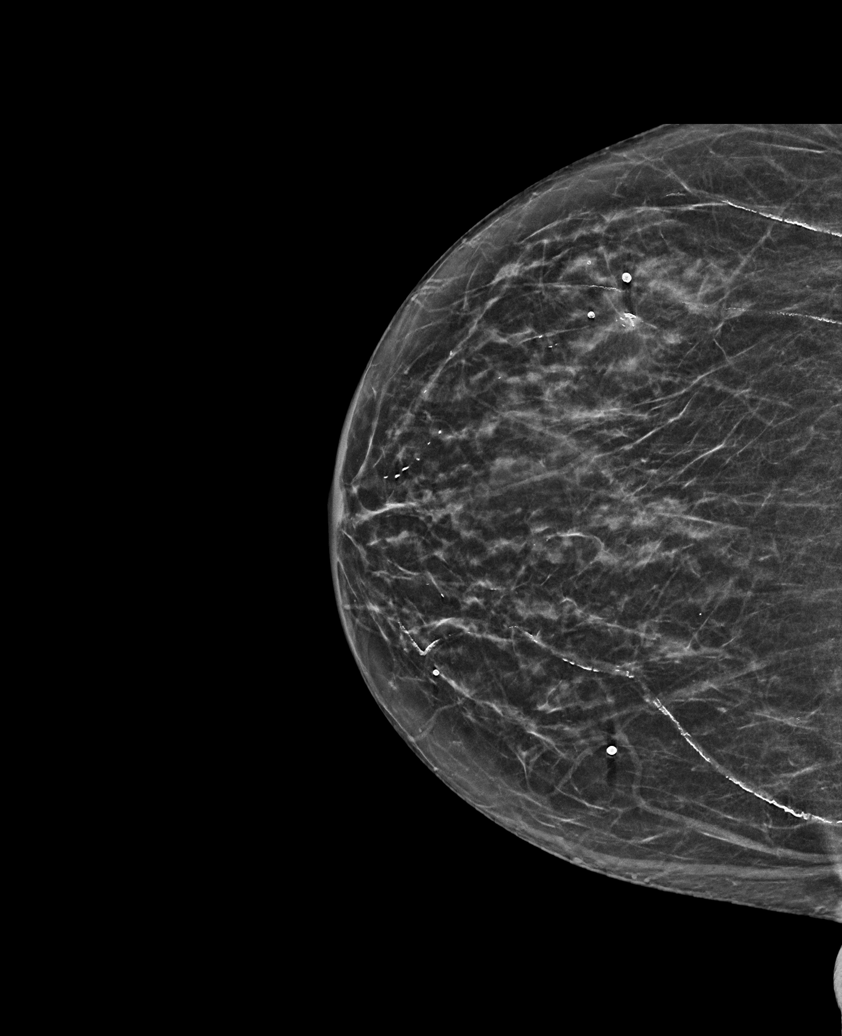

[R MLO synth-2D]
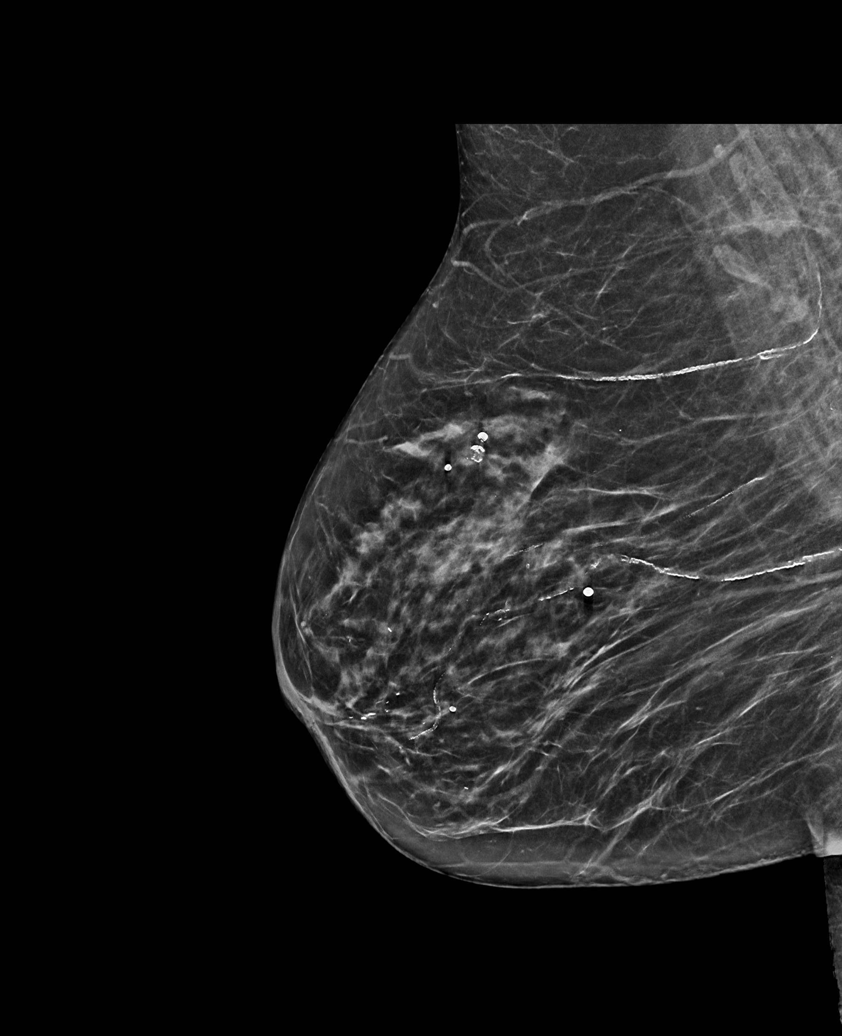

[R TAN tomo · tomo slice 19/37.0]
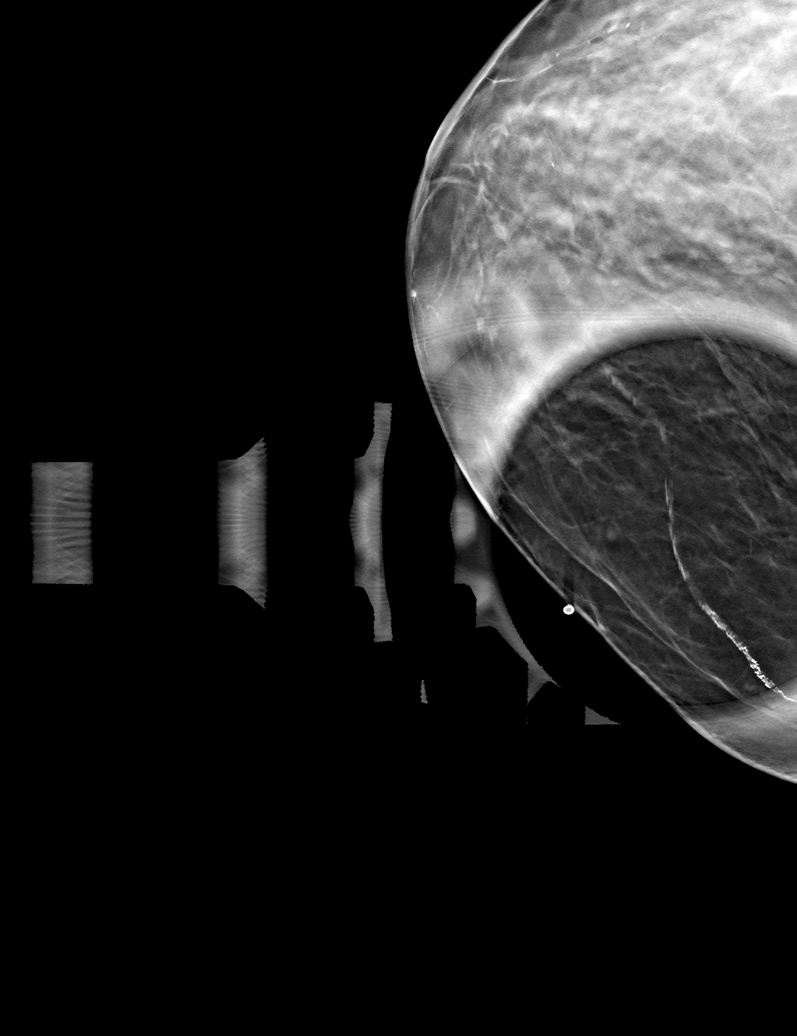

[6 of 30 positions shown; findings below may reference images not displayed]

ACR Breast Density Category b: There are scattered areas of
fibroglandular density.
FINDINGS: No suspicious masses, calcifications, or distortion are identified
in either breast.

Mammographic images were processed with CAD.

On physical exam, no suspicious lumps are identified.

Targeted ultrasound is performed, showing no sonographic evidence of
malignancy in the region of the patient's palpable lump.
IMPRESSION: No mammographic or sonographic evidence of malignancy.

RECOMMENDATION:
Treatment of the patient's symptoms should be based on clinical and
physical exam given lack of imaging findings. Recommend annual
screening mammography.

I have discussed the findings and recommendations with the patient.
If applicable, a reminder letter will be sent to the patient
regarding the next appointment.

BI-RADS CATEGORY  2: Benign.

## 2022-04-17 ENCOUNTER — Other Ambulatory Visit: Payer: Self-pay | Admitting: Family Medicine

## 2022-04-18 ENCOUNTER — Other Ambulatory Visit: Payer: Self-pay | Admitting: *Deleted

## 2022-04-18 MED ORDER — CLONAZEPAM 1 MG PO TABS: 1.0000 mg | ORAL_TABLET | Freq: Two times a day (BID) | ORAL | 1 refills | Status: DC | PRN

## 2022-04-18 NOTE — Telephone Encounter (Signed)
Patient requests to be called with status of RX-Patient requests 90 day supply.

## 2022-04-18 NOTE — Telephone Encounter (Signed)
Pt states she was told bt pharmacy that she needs a written RX for [employer]. Please advise.

## 2022-04-19 DIAGNOSIS — E113313 Type 2 diabetes mellitus with moderate nonproliferative diabetic retinopathy with macular edema, bilateral: Secondary | ICD-10-CM | POA: Diagnosis not present

## 2022-04-19 DIAGNOSIS — H04123 Dry eye syndrome of bilateral lacrimal glands: Secondary | ICD-10-CM | POA: Diagnosis not present

## 2022-04-19 DIAGNOSIS — Z881 Allergy status to other antibiotic agents status: Secondary | ICD-10-CM | POA: Diagnosis not present

## 2022-04-19 DIAGNOSIS — E1136 Type 2 diabetes mellitus with diabetic cataract: Secondary | ICD-10-CM | POA: Diagnosis not present

## 2022-04-19 DIAGNOSIS — H2513 Age-related nuclear cataract, bilateral: Secondary | ICD-10-CM | POA: Diagnosis not present

## 2022-04-19 DIAGNOSIS — H25813 Combined forms of age-related cataract, bilateral: Secondary | ICD-10-CM | POA: Diagnosis not present

## 2022-04-19 DIAGNOSIS — H43813 Vitreous degeneration, bilateral: Secondary | ICD-10-CM | POA: Diagnosis not present

## 2022-04-19 DIAGNOSIS — H43812 Vitreous degeneration, left eye: Secondary | ICD-10-CM | POA: Diagnosis not present

## 2022-04-19 DIAGNOSIS — H3581 Retinal edema: Secondary | ICD-10-CM | POA: Diagnosis not present

## 2022-04-19 DIAGNOSIS — Z885 Allergy status to narcotic agent status: Secondary | ICD-10-CM | POA: Diagnosis not present

## 2022-05-03 ENCOUNTER — Encounter: Payer: Self-pay | Admitting: Family Medicine

## 2022-05-03 ENCOUNTER — Telehealth (INDEPENDENT_AMBULATORY_CARE_PROVIDER_SITE_OTHER): Payer: No Typology Code available for payment source | Admitting: Family Medicine

## 2022-05-03 ENCOUNTER — Ambulatory Visit: Payer: No Typology Code available for payment source | Admitting: Neurology

## 2022-05-03 VITALS — Ht 62.0 in | Wt 158.6 lb

## 2022-05-03 DIAGNOSIS — J069 Acute upper respiratory infection, unspecified: Secondary | ICD-10-CM | POA: Diagnosis not present

## 2022-05-03 MED ORDER — DOXYCYCLINE HYCLATE 100 MG PO CAPS: 100.0000 mg | ORAL_CAPSULE | Freq: Two times a day (BID) | ORAL | 0 refills | Status: DC

## 2022-05-03 MED ORDER — BENZONATATE 100 MG PO CAPS: 100.0000 mg | ORAL_CAPSULE | Freq: Three times a day (TID) | ORAL | 0 refills | Status: DC | PRN

## 2022-05-03 NOTE — Progress Notes (Signed)
Patient ID: Kristy Trujillo, female   DOB: [birth year], 72 y.o.   MRN: [id number]   Virtual Visit via Video Note  I connected with [facility] on 05/03/22 at  4:45 PM EST by a video enabled telemedicine application and verified that I am speaking with the correct person using two identifiers.  Location patient: home Location provider:work or home office Persons participating in the virtual visit: patient, provider  I discussed the limitations of evaluation and management by telemedicine and the availability of in person appointments. The patient expressed understanding and agreed to proceed.   HPI:  Kristy Trujillo is seen as a work in virtually with upper respiratory symptoms which started sometime over last weekend.  She relates cough, sore throat, some congestion.  She thinks she may have had some low-grade fever.  Denies any nausea, vomiting, or diarrhea.  Mild body aches.  Has not done any COVID testing.  She was around her grandson who had similar symptoms.  She denies any chronic lung or heart problems  She does have history of type 2 diabetes with recent A1c 7.8%.   ROS: See pertinent positives and negatives per HPI.  Past Medical History:  Diagnosis Date   Gallstones    Shingles     Past Surgical History:  Procedure Laterality Date   CHOLECYSTECTOMY      Family History  Problem Relation Age of Onset   Diabetes Neg Hx    High Cholesterol Neg Hx    Hypertension Neg Hx    Colon cancer Neg Hx    Breast cancer Neg Hx     SOCIAL HX: Non-smoker   Current Outpatient Medications:    atorvastatin (LIPITOR) 40 MG tablet, Take 1 tablet (40 mg total) by mouth daily., Disp: 90 tablet, Rfl: 3   benzonatate (TESSALON PERLES) 100 MG capsule, Take 1 capsule (100 mg total) by mouth 3 (three) times daily as needed for cough., Disp: 30 capsule, Rfl: 0   clonazePAM (KLONOPIN) 1 MG tablet, Take 1 tablet (1 mg total) by mouth 2 (two) times daily as needed. for anxiety, Disp: 180 tablet, Rfl: 1    doxycycline (VIBRAMYCIN) 100 MG capsule, Take 1 capsule (100 mg total) by mouth 2 (two) times daily., Disp: 14 capsule, Rfl: 0   FLUoxetine (PROZAC) 40 MG capsule, Take 1 capsule by mouth once daily, Disp: 90 capsule, Rfl: 0   gabapentin (NEURONTIN) 300 MG capsule, TAKE 1 CAPSULE BY MOUTH THREE TIMES DAILY AS NEEDED (FOR ANXIETY, SLEEP)., Disp: 30 capsule, Rfl: 0   hydrochlorothiazide (HYDRODIURIL) 25 MG tablet, Take 1 tablet (25 mg total) by mouth daily., Disp: 90 tablet, Rfl: 3   hydrOXYzine (ATARAX) 25 MG tablet, TAKE 1 TABLET BY MOUTH THREE TIMES DAILY, Disp: 90 tablet, Rfl: 0   mirabegron ER (MYRBETRIQ) 25 MG TB24 tablet, Take 1 tablet (25 mg total) by mouth daily., Disp: 30 tablet, Rfl: 5   Multiple Vitamins-Minerals (MULTIVITAMINS THER. W/MINERALS) TABS, Take 1 tablet by mouth daily., Disp: , Rfl:    OVER THE COUNTER MEDICATION, Take 2 tablets by mouth every morning. OTC allergy and sinus tablets, Disp: , Rfl:    triamcinolone ointment (KENALOG) 0.5 %, Apply 1 application topically 2 (two) times daily., Disp: 30 g, Rfl: 0   valACYclovir (VALTREX) 1000 MG tablet, Take 1 tablet (1,000 mg total) by mouth 3 (three) times daily., Disp: 21 tablet, Rfl: 0  EXAM:  VITALS per patient if applicable:  GENERAL: alert, oriented, appears well and in no acute distress  HEENT:  atraumatic, conjunttiva clear, no obvious abnormalities on inspection of external nose and ears  NECK: normal movements of the head and neck  LUNGS: on inspection no signs of respiratory distress, breathing rate appears normal, no obvious gross SOB, gasping or wheezing  CV: no obvious cyanosis  MS: moves all visible extremities without noticeable abnormality  PSYCH/NEURO: pleasant and cooperative, no obvious depression or anxiety, speech and thought processing grossly intact  ASSESSMENT AND PLAN:  Discussed the following assessment and plan:  Upper respiratory infection with cough.  Patient has not done any COVID  testing.  She is on day 4 or 5.  She is aware that Tamiflu would not be advised this far into illness.  Also probably not much benefit of COVID testing at this point.    -Plenty of fluids and rest -Follow-up immediately for any fever or increased shortness of breath -Tessalon Perles 100 mg every 8 hours as needed for cough -Patient had questions regarding antibiotics.  We explained this sounds more likely viral.  We have not recommend an antibiotic at this point but if she has any fever or persistent productive cough consider doxycycline 100 mg twice daily for 7 days     I discussed the assessment and treatment plan with the patient. The patient was provided an opportunity to ask questions and all were answered. The patient agreed with the plan and demonstrated an understanding of the instructions.   The patient was advised to call back or seek an in-person evaluation if the symptoms worsen or if the condition fails to improve as anticipated.     Carolann Littler, MD

## 2022-05-04 ENCOUNTER — Telehealth: Payer: Self-pay | Admitting: Family Medicine

## 2022-05-04 NOTE — Telephone Encounter (Signed)
See note

## 2022-05-04 NOTE — Telephone Encounter (Signed)
Patient states she has VV with Dr. Elease Hashimoto 05/03/22. Patient states she contracted strep throat and Virus from her grandson.  States she is taking antibiotic and cough pearls.

## 2022-05-05 NOTE — Telephone Encounter (Signed)
Can we call and clarify with patient? She is out of window for tamiflu at this point.  She should let us know if she is not improving.  Kristy Trujillo. Jerline Pain, MD 05/05/2022 10:53 AM

## 2022-05-05 NOTE — Telephone Encounter (Signed)
Please call pt back, she is not understanding the issues going on. Please call her back.

## 2022-05-17 DIAGNOSIS — E113311 Type 2 diabetes mellitus with moderate nonproliferative diabetic retinopathy with macular edema, right eye: Secondary | ICD-10-CM | POA: Diagnosis not present

## 2022-05-17 DIAGNOSIS — H04123 Dry eye syndrome of bilateral lacrimal glands: Secondary | ICD-10-CM | POA: Diagnosis not present

## 2022-05-17 DIAGNOSIS — H43813 Vitreous degeneration, bilateral: Secondary | ICD-10-CM | POA: Diagnosis not present

## 2022-05-17 DIAGNOSIS — E113392 Type 2 diabetes mellitus with moderate nonproliferative diabetic retinopathy without macular edema, left eye: Secondary | ICD-10-CM | POA: Diagnosis not present

## 2022-05-17 DIAGNOSIS — E11311 Type 2 diabetes mellitus with unspecified diabetic retinopathy with macular edema: Secondary | ICD-10-CM | POA: Diagnosis not present

## 2022-05-17 DIAGNOSIS — H25813 Combined forms of age-related cataract, bilateral: Secondary | ICD-10-CM | POA: Diagnosis not present

## 2022-05-19 ENCOUNTER — Other Ambulatory Visit: Payer: Self-pay | Admitting: Family Medicine

## 2022-05-19 ENCOUNTER — Telehealth: Payer: Self-pay | Admitting: Family Medicine

## 2022-05-19 NOTE — Telephone Encounter (Signed)
Pt WILL NOT come in for an appt for a UTI. Please advise.

## 2022-05-19 NOTE — Telephone Encounter (Signed)
Spoke with patient stated need Rx MYRBETRIQ  Advise to call pharmacy for refills

## 2022-05-23 ENCOUNTER — Other Ambulatory Visit: Payer: Self-pay | Admitting: *Deleted

## 2022-05-23 MED ORDER — ATORVASTATIN CALCIUM 40 MG PO TABS: 40.0000 mg | ORAL_TABLET | Freq: Every day | ORAL | 1 refills | Status: DC

## 2022-05-31 ENCOUNTER — Ambulatory Visit (INDEPENDENT_AMBULATORY_CARE_PROVIDER_SITE_OTHER): Payer: No Typology Code available for payment source | Admitting: Family

## 2022-05-31 ENCOUNTER — Encounter: Payer: Self-pay | Admitting: Family

## 2022-05-31 VITALS — BP 154/70 | HR 69 | Temp 97.7°F | Ht 62.0 in | Wt 157.2 lb

## 2022-05-31 DIAGNOSIS — N3090 Cystitis, unspecified without hematuria: Secondary | ICD-10-CM

## 2022-05-31 LAB — POCT URINALYSIS DIPSTICK
Bilirubin, UA: NEGATIVE
Glucose, UA: NEGATIVE
Ketones, UA: NEGATIVE
Nitrite, UA: NEGATIVE
Protein, UA: POSITIVE — AB
Spec Grav, UA: 1.02 (ref 1.010–1.025)
Urobilinogen, UA: 0.2 E.U./dL
pH, UA: 5 (ref 5.0–8.0)

## 2022-05-31 MED ORDER — NITROFURANTOIN MONOHYD MACRO 100 MG PO CAPS: 100.0000 mg | ORAL_CAPSULE | Freq: Two times a day (BID) | ORAL | 0 refills | Status: DC

## 2022-05-31 NOTE — Progress Notes (Signed)
Patient ID: Kristy Trujillo, female    DOB: Jul 31, 1950, 72 y.o.   MRN: [id number]  Chief Complaint  Patient presents with   Dysuria    Pt c/o pain during urination, present for 2 weeks. Has tried Azo.     HPI:      UTI sx:   back pain, burning w/ wiping and urinating, denies discharge, denies itching, does have some incontinence with bowels and urine. She reports starting Myrbetriq & Azo for 10 days without relief. Last UTI in 08/2021 & treated with Macrobid.  Assessment & Plan:  1. Cystitis - sending Macrobid, pt has taken in past and worked for her. Reminded on use & SE. Advised to stop AZO & only take for 3d in future. Also advised on good urinary hygiene habits as pt is incontinent of urine and stool currently. Must continue to hydrate with water, 2L qd.  - POCT Urinalysis Dipstick - Urine Culture - nitrofurantoin, macrocrystal-monohydrate, (MACROBID) 100 MG capsule; Take 1 capsule (100 mg total) by mouth 2 (two) times daily.  Dispense: 14 capsule; Refill: 0   Subjective:    Outpatient Medications Prior to Visit  Medication Sig Dispense Refill   atorvastatin (LIPITOR) 40 MG tablet Take 1 tablet (40 mg total) by mouth daily. 90 tablet 1   benzonatate (TESSALON PERLES) 100 MG capsule Take 1 capsule (100 mg total) by mouth 3 (three) times daily as needed for cough. 30 capsule 0   clonazePAM (KLONOPIN) 1 MG tablet Take 1 tablet (1 mg total) by mouth 2 (two) times daily as needed. for anxiety 180 tablet 1   doxycycline (VIBRAMYCIN) 100 MG capsule Take 1 capsule (100 mg total) by mouth 2 (two) times daily. 14 capsule 0   FLUoxetine (PROZAC) 40 MG capsule Take 1 capsule by mouth once daily 90 capsule 0   gabapentin (NEURONTIN) 300 MG capsule TAKE 1 CAPSULE BY MOUTH THREE TIMES DAILY AS NEEDED (FOR ANXIETY, SLEEP). 30 capsule 0   hydrochlorothiazide (HYDRODIURIL) 25 MG tablet Take 1 tablet (25 mg total) by mouth daily. 90 tablet 3   hydrOXYzine (ATARAX) 25 MG tablet TAKE 1 TABLET BY MOUTH  THREE TIMES DAILY 90 tablet 0   Multiple Vitamins-Minerals (MULTIVITAMINS THER. W/MINERALS) TABS Take 1 tablet by mouth daily.     MYRBETRIQ 25 MG TB24 tablet Take 1 tablet by mouth once daily 30 tablet 0   OVER THE COUNTER MEDICATION Take 2 tablets by mouth every morning. OTC allergy and sinus tablets     triamcinolone ointment (KENALOG) 0.5 % Apply 1 application topically 2 (two) times daily. 30 g 0   valACYclovir (VALTREX) 1000 MG tablet Take 1 tablet (1,000 mg total) by mouth 3 (three) times daily. 21 tablet 0   No facility-administered medications prior to visit.   Past Medical History:  Diagnosis Date   Gallstones    Shingles    Past Surgical History:  Procedure Laterality Date   CHOLECYSTECTOMY     Allergies  Allergen Reactions   Alcohol Detox [Nutritional Supplements]     Flushing (cough medicine)   Codeine Swelling   Erythromycin Swelling      Objective:    Physical Exam Vitals and nursing note reviewed.  Constitutional:      Appearance: Normal appearance.  Cardiovascular:     Rate and Rhythm: Normal rate and regular rhythm.  Pulmonary:     Effort: Pulmonary effort is normal.     Breath sounds: Normal breath sounds.  Musculoskeletal:  General: Normal range of motion.  Skin:    General: Skin is warm and dry.  Neurological:     Mental Status: She is alert.  Psychiatric:        Mood and Affect: Mood normal.        Behavior: Behavior normal.    BP (!) 154/77 (BP Location: Left Arm, Patient Position: Sitting, Cuff Size: Large)   Pulse 69   Temp 97.7 F (36.5 C) (Temporal)   Ht 5\' 2"  (1.575 m)   Wt 157 lb 4 oz (71.3 kg)   SpO2 99%   BMI 28.76 kg/m  Wt Readings from Last 3 Encounters:  05/31/22 157 lb 4 oz (71.3 kg)  05/03/22 158 lb 9.6 oz (71.9 kg)  12/15/21 158 lb 9.6 oz (71.9 kg)       Jeanie Sewer, NP

## 2022-06-01 ENCOUNTER — Other Ambulatory Visit: Payer: Self-pay | Admitting: Family Medicine

## 2022-06-03 LAB — URINE CULTURE
MICRO NUMBER:: 14777603
SPECIMEN QUALITY:: ADEQUATE

## 2022-06-05 MED ORDER — LEVOFLOXACIN 250 MG PO TABS
250.0000 mg | ORAL_TABLET | Freq: Every day | ORAL | 0 refills | Status: DC
Start: 2022-06-05 — End: 2022-08-21

## 2022-06-05 NOTE — Addendum Note (Signed)
Addended byDulce Sellar on: 06/05/2022 11:09 PM   Modules accepted: Orders

## 2022-06-05 NOTE — Progress Notes (Signed)
please call Kristy Trujillo asap & tell her to stop the macrobid antibiotic I gave her last week - it is not susceptible to treating her UTI based on the culture results - I have sent in a new one to her pharmacy for her to take for just 3 days. thx

## 2022-06-16 ENCOUNTER — Telehealth: Payer: Self-pay | Admitting: Family Medicine

## 2022-06-16 ENCOUNTER — Other Ambulatory Visit: Payer: Self-pay

## 2022-06-16 MED ORDER — MIRABEGRON ER 25 MG PO TB24: 25.0000 mg | ORAL_TABLET | Freq: Every day | ORAL | 0 refills | Status: DC

## 2022-06-16 NOTE — Telephone Encounter (Signed)
Prescription Request  06/16/2022  LOV: 12/15/2021  What is the name of the medication or equipment? MYRBETRIQ 25 MG TB24 tablet   Have you contacted your pharmacy to request a refill? Yes   Which pharmacy would you like this sent to?  [facility] [address], [place] - [address] [place] [address] Phone: [phone] Fax: [phone]    Patient notified that their request is being sent to the clinical staff for review and that they should receive a response within 2 business days.   Please advise at Mobile [phone] (mobile)

## 2022-06-16 NOTE — Telephone Encounter (Signed)
90 day script sent to pharmacy 

## 2022-06-26 DIAGNOSIS — H25813 Combined forms of age-related cataract, bilateral: Secondary | ICD-10-CM | POA: Diagnosis not present

## 2022-06-26 DIAGNOSIS — E11311 Type 2 diabetes mellitus with unspecified diabetic retinopathy with macular edema: Secondary | ICD-10-CM | POA: Diagnosis not present

## 2022-06-26 DIAGNOSIS — H04123 Dry eye syndrome of bilateral lacrimal glands: Secondary | ICD-10-CM | POA: Diagnosis not present

## 2022-06-26 DIAGNOSIS — H43813 Vitreous degeneration, bilateral: Secondary | ICD-10-CM | POA: Diagnosis not present

## 2022-07-13 ENCOUNTER — Encounter: Payer: Self-pay | Admitting: Family Medicine

## 2022-07-13 ENCOUNTER — Ambulatory Visit: Payer: No Typology Code available for payment source | Admitting: Family Medicine

## 2022-07-13 VITALS — BP 140/84 | HR 83 | Temp 98.0°F | Ht 62.0 in | Wt 156.6 lb

## 2022-07-13 DIAGNOSIS — N3281 Overactive bladder: Secondary | ICD-10-CM

## 2022-07-13 DIAGNOSIS — N390 Urinary tract infection, site not specified: Secondary | ICD-10-CM | POA: Diagnosis not present

## 2022-07-13 DIAGNOSIS — E1159 Type 2 diabetes mellitus with other circulatory complications: Secondary | ICD-10-CM

## 2022-07-13 DIAGNOSIS — R21 Rash and other nonspecific skin eruption: Secondary | ICD-10-CM | POA: Diagnosis not present

## 2022-07-13 DIAGNOSIS — E119 Type 2 diabetes mellitus without complications: Secondary | ICD-10-CM | POA: Diagnosis not present

## 2022-07-13 DIAGNOSIS — I152 Hypertension secondary to endocrine disorders: Secondary | ICD-10-CM | POA: Diagnosis not present

## 2022-07-13 LAB — POCT GLYCOSYLATED HEMOGLOBIN (HGB A1C): Hemoglobin A1C: 6.7 % — AB (ref 4.0–5.6)

## 2022-07-13 LAB — POCT URINALYSIS DIPSTICK
Bilirubin, UA: NEGATIVE
Blood, UA: POSITIVE
Glucose, UA: NEGATIVE
Ketones, UA: NEGATIVE
Nitrite, UA: NEGATIVE
Protein, UA: POSITIVE — AB
Spec Grav, UA: 1.025 (ref 1.010–1.025)
Urobilinogen, UA: 0.2 E.U./dL
pH, UA: 5.5 (ref 5.0–8.0)

## 2022-07-13 MED ORDER — TRIAMCINOLONE ACETONIDE 0.5 % EX OINT: 1.0000 | TOPICAL_OINTMENT | Freq: Two times a day (BID) | CUTANEOUS | 0 refills | Status: DC

## 2022-07-13 MED ORDER — SULFAMETHOXAZOLE-TRIMETHOPRIM 800-160 MG PO TABS: 1.0000 | ORAL_TABLET | Freq: Two times a day (BID) | ORAL | 0 refills | Status: AC

## 2022-07-13 NOTE — Assessment & Plan Note (Signed)
A1c much improved to 6.7.  She is not currently on any medications.  She is working on diet and exercise.  Congratulated patient on A1c reduction.  Recheck A1c in 3 to 6 months.

## 2022-07-13 NOTE — Patient Instructions (Signed)
It was very nice to see you today!  Please start the antibiotic for your UTI.  I will refer you to see the urologist.  Please start the cream for your rash.  Let us know if not improving.  Return in about 3 months (around 10/13/2022).   Take care, Dr [name]  PLEASE NOTE:  If you had any lab tests, please let us know if you have not heard back within a few days. You may see your results on mychart before we have a chance to review them but we will give you a call once they are reviewed by [place].   If we ordered any referrals today, please let us know if you have not heard from their office within the next week.   If you had any urgent prescriptions sent in today, please check with the pharmacy within an hour of our visit to make sure the prescription was transmitted appropriately.   Please try these tips to maintain a healthy lifestyle:  Eat at least 3 REAL meals and 1-2 snacks per day.  Aim for no more than 5 hours between eating.  If you eat breakfast, please do so within one hour of getting up.   Each meal should contain half fruits/vegetables, one quarter protein, and one quarter carbs (no bigger than a computer mouse)  Cut down on sweet beverages. This includes juice, soda, and sweet tea.   Drink at least 1 glass of water with each meal and aim for at least 8 glasses per day  Exercise at least 150 minutes every week.

## 2022-07-13 NOTE — Progress Notes (Signed)
   Kristy Trujillo is a 72 y.o. female who presents today for an office visit.  Assessment/Plan:  New/Acute Problems: Urinary tract infection No red flags.  No signs of systemic illness.  UA and history consistent with UTI.  Most recent urine culture from a month ago showed Proteus resistant to Ancef and Macrobid.  Will start Bactrim.  Urine culture is pending.  Encouraged hydration.  We discussed reasons to return to care or seek emergent care  Rash  Potentially contact dermatitis.  No red flag signs or symptoms or signs of systemic illness.  Will start topical triamcinolone.  Would consider referral to dermatology if not improving.  Chronic Problems Addressed Today: Diabetes mellitus without complication (HCC) A1c much improved to 6.7.  She is not currently on any medications.  She is working on diet and exercise.  Congratulated patient on A1c reduction.  Recheck A1c in 3 to 6 months.  Hypertension associated with diabetes (HCC) Slightly above goal today.  She is typically been well-controlled on HCTZ 25 mg daily.  She will continue to monitor at home and let us know if persistently elevated.  Recheck again in 3 months at next office visit.  OAB (overactive bladder) Has been seen by urogynecology had [facility] however still has persistent symptoms.  She is currently on Myrbetriq 25 mg daily.  Will refer to local urogynecologist [facility].     Subjective:  HPI:  See Assessment / plan for status of chronic conditions.  She is concerned about UTI.  This started several days ago.  More frequency and urgency.  Consistent with prior UTI.  No fevers or chills.  She has also been having a rash for several weeks.  Started on lower extremity.  Now having some rash on right abdomen and left lower extremity as well.  Tried using cortisone 10 without much improvement.        Objective:  Physical Exam: BP (!) 140/84   Pulse 83   Temp 98 F (36.7 C) (Temporal)   Ht 5\' 2"  (1.575 m)   Wt  156 lb 9.6 oz (71 kg)   SpO2 100%   BMI 28.64 kg/m   Gen: No acute distress, resting comfortably CV: Regular rate and rhythm with no murmurs appreciated Pulm: Normal work of breathing, clear to auscultation bilaterally with no crackles, wheezes, or rhonchi Skin: Eczematous patch on posterior right leg Neuro: Grossly normal, moves all extremities Psych: Normal affect and thought content      Ayinde Swim M. [name], MD 07/13/2022 12:13 PM

## 2022-07-13 NOTE — Assessment & Plan Note (Signed)
Has been seen by urogynecology had [facility] however still has persistent symptoms.  She is currently on Myrbetriq 25 mg daily.  Will refer to local urogynecologist at [facility].

## 2022-07-13 NOTE — Assessment & Plan Note (Signed)
Slightly above goal today.  She is typically been well-controlled on HCTZ 25 mg daily.  She will continue to monitor at home and let us know if persistently elevated.  Recheck again in 3 months at next office visit.

## 2022-07-14 ENCOUNTER — Other Ambulatory Visit: Payer: Self-pay | Admitting: *Deleted

## 2022-07-14 ENCOUNTER — Telehealth: Payer: Self-pay | Admitting: Family Medicine

## 2022-07-14 MED ORDER — TRIAMCINOLONE ACETONIDE 0.5 % EX CREA: 1.0000 | TOPICAL_CREAM | Freq: Three times a day (TID) | CUTANEOUS | 0 refills | Status: DC

## 2022-07-14 NOTE — Telephone Encounter (Signed)
Please advise 

## 2022-07-14 NOTE — Telephone Encounter (Signed)
Patient requests the following RX that was sent in for triamcinolone ointment (KENALOG) 0.5 %   be resent as a cream (does not like the ointment)  States the rash is spreading, itching and burning.  Requests to be advised

## 2022-07-14 NOTE — Telephone Encounter (Signed)
Ok to send in as a cream.  Kristy Trujillo. [name], MD 07/14/2022 7:29 AM

## 2022-07-14 NOTE — Telephone Encounter (Signed)
This was addressed in her previous note. Ok to send in cream.  Katina Degree. [name], MD 07/14/2022 9:50 AM

## 2022-07-15 LAB — URINE CULTURE
MICRO NUMBER:: 14965898
SPECIMEN QUALITY:: ADEQUATE

## 2022-07-16 ENCOUNTER — Encounter: Payer: Self-pay | Admitting: Family Medicine

## 2022-07-17 ENCOUNTER — Other Ambulatory Visit: Payer: Self-pay | Admitting: *Deleted

## 2022-07-17 ENCOUNTER — Telehealth: Payer: Self-pay | Admitting: Family Medicine

## 2022-07-17 MED ORDER — NITROFURANTOIN MONOHYD MACRO 100 MG PO CAPS: 100.0000 mg | ORAL_CAPSULE | Freq: Two times a day (BID) | ORAL | 0 refills | Status: AC

## 2022-07-17 NOTE — Telephone Encounter (Signed)
Patient aware of lab results Rash not better. Schedule a F/U visit with PCP

## 2022-07-17 NOTE — Telephone Encounter (Signed)
This was already addressed.  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 [facility] has a petroleum based ointment which did not work and states the rash has spread. Translation No Nurse Assessment Nurse: Paul Half, RN, Betsy Date/Time (Eastern Time): 07/14/2022 8:05:25 AM Confirm and document reason for call. If symptomatic, describe symptoms. ---Caller states she was seen yesterday for UTI and widespread rash that itches and she was given a prescription for a cream. [facility] has a petroleum based ointment which did not work and states the rash has spread. Does the patient have any new or worsening symptoms? ---Yes Will a triage be completed? ---Yes Related visit to physician within the last 2 weeks? ---Yes Does the PT have any chronic conditions? (i.e. diabetes, asthma, this includes High risk factors for pregnancy, etc.) ---No Is this a behavioral health or substance abuse call? ---No Guidelines Guideline Title Affirmed Question Affirmed Notes Nurse Date/Time (Eastern Time) Rash or Redness - Widespread [1] Purple or bloodcolored rash (spots or dots) AND [2] no fever AND [3] sounds well to triager Paul Half, RN, Betsy 07/14/2022 8:12:05 AM Disp. Time Lamount Cohen Time) Disposition Final User 07/14/2022 8:22:47 AM See HCP  within 4 Hours (or PCP triage) Yes Paul Half, RN, Tamela Oddi Final Disposition 07/14/2022 8:22:47 AM See HCP within 4 Hours (or PCP triage) Yes Honore, RN, Tamela Oddi Caller Disagree/Comply Comply Caller Understands Yes PreDisposition Did not know what to do Care Advice Given Per Guideline * IF OFFICE WILL BE OPEN: You need to be seen within the next 3 or 4 hours. Call your doctor (or NP/PA) now or as soon as the office opens. * IF OFFICE WILL BE CLOSED AND NO PCP (PRIMARY CARE PROVIDER) SECOND-LEVEL TRIAGE: You need to be seen within the next 3 or 4 hours. A nearby Urgent Care Center [facility]) is often a good source of care. Another choice is to go to the ED. Go sooner if you become worse. CARE ADVICE given per Rash - Widespread and Cause Unknown (Adult) guideline. Referrals REFERRED TO PCP OFFICE

## 2022-07-17 NOTE — Telephone Encounter (Signed)
See note

## 2022-07-17 NOTE — Progress Notes (Signed)
Urine culture confirms UTI however the bacteria she has is resistant to the antibiotic we gave her.  Recommend starting Macrobid 100 mg twice daily x 7 days.  She should let us know if not improving  Kristy Trujillo M. [name], MD 07/17/2022 9:06 AM

## 2022-07-17 NOTE — Telephone Encounter (Signed)
Kenalog cream was send on 07/14/2022

## 2022-07-17 NOTE — Telephone Encounter (Signed)
Please see the result note for her urinary tract infection. Recommend she come back in for the rash if it is not getting better.  Kristy Trujillo. [name], MD 07/17/2022 9:34 AM

## 2022-07-17 NOTE — Telephone Encounter (Signed)
Pt does not want to come in office and wants a nurse to call her back about meds not working. Please advise.

## 2022-07-18 NOTE — Telephone Encounter (Signed)
LVM please give [place] a call to schedule an OV if no changes as Dr [name] recommenced

## 2022-07-19 ENCOUNTER — Ambulatory Visit: Payer: No Typology Code available for payment source | Admitting: Family Medicine

## 2022-07-19 VITALS — BP 139/78 | HR 89 | Temp 97.7°F | Ht 62.0 in | Wt 152.8 lb

## 2022-07-19 DIAGNOSIS — E1159 Type 2 diabetes mellitus with other circulatory complications: Secondary | ICD-10-CM

## 2022-07-19 DIAGNOSIS — I152 Hypertension secondary to endocrine disorders: Secondary | ICD-10-CM

## 2022-07-19 DIAGNOSIS — F419 Anxiety disorder, unspecified: Secondary | ICD-10-CM

## 2022-07-19 DIAGNOSIS — R21 Rash and other nonspecific skin eruption: Secondary | ICD-10-CM | POA: Diagnosis not present

## 2022-07-19 DIAGNOSIS — E119 Type 2 diabetes mellitus without complications: Secondary | ICD-10-CM | POA: Diagnosis not present

## 2022-07-19 MED ORDER — METHYLPREDNISOLONE ACETATE 80 MG/ML IJ SUSP
80.0000 mg | Freq: Once | INTRAMUSCULAR | Status: AC
Start: 2022-07-19 — End: 2022-07-19
  Administered 2022-07-19: 80 mg via INTRAMUSCULAR

## 2022-07-19 MED ORDER — PREDNISONE 20 MG PO TABS: 20.0000 mg | ORAL_TABLET | Freq: Every day | ORAL | 0 refills | Status: DC

## 2022-07-19 NOTE — Patient Instructions (Signed)
It was very nice to see you today!  We gave you a steroid shot today.  Please start the prednisone tomorrow.  Let us know if your rash is not improving.  Keep an eye on your blood pressure and let us know if it is persistently elevated.   Return if symptoms worsen or fail to improve.   Take care, Dr [name]  PLEASE NOTE:  If you had any lab tests, please let us know if you have not heard back within a few days. You may see your results on mychart before we have a chance to review them but we will give you a call once they are reviewed by [place].   If we ordered any referrals today, please let us know if you have not heard from their office within the next week.   If you had any urgent prescriptions sent in today, please check with the pharmacy within an hour of our visit to make sure the prescription was transmitted appropriately.   Please try these tips to maintain a healthy lifestyle:  Eat at least 3 REAL meals and 1-2 snacks per day.  Aim for no more than 5 hours between eating.  If you eat breakfast, please do so within one hour of getting up.   Each meal should contain half fruits/vegetables, one quarter protein, and one quarter carbs (no bigger than a computer mouse)  Cut down on sweet beverages. This includes juice, soda, and sweet tea.   Drink at least 1 glass of water with each meal and aim for at least 8 glasses per day  Exercise at least 150 minutes every week.

## 2022-07-19 NOTE — Progress Notes (Signed)
   Kristy Trujillo is a 72 y.o. female who presents today for an office visit.  Assessment/Plan:  New/Acute Problems: Rash No significant improvement since our last visit.  Appearance is consistent with contact dermatitis or other allergic type reaction.  She is only had minimal benefit with the triamcinolone.  Given widespread distribution would be reasonable to start systemic steroids at this point.  She has tolerated these well in the past.  Will give 80 mg of Depo-Medrol today and start a 10-day course of prednisone.  She can continue using the triamcinolone as needed as well.  She will let us know if not improving and we will refer to dermatology.  Would also consider biopsy if there will be a long wait before she can get into see dermatology.  Chronic Problems Addressed Today: Diabetes mellitus without complication (HCC) Last A1c's 6.7.  Do not anticipate any significant issues with steroid burst as above.  Will recheck A1c in 3 to 6 months.  Not currently on any medications.  Hypertension associated with diabetes (HCC) Initially elevated but at goal on recheck.  Continue HCTZ 25 mg daily.  She will continue to monitor at home and let us know if persistently elevated.     Subjective:  HPI:  See Assessment / plan for status of chronic conditions.  We saw her a week ago for UTI symptoms and rash.  Urine culture was positive.  She is started on Macrobid.  UTI symptoms are improving.  We started her on triamcinolone for her rash which was thought to be due to contact dermatitis.  This has not helped.  Rash is continued to spread.  Still has a few locations on torso.  No other obvious exposures or detergents.  No other treatments tried.  Rash is very itchy.       Objective:  Physical Exam: BP 139/78   Pulse 89   Temp 97.7 F (36.5 C) (Temporal)   Ht 5\' 2"  (1.575 m)   Wt 152 lb 12.8 oz (69.3 kg)   SpO2 100%   BMI 27.95 kg/m   Gen: No acute distress, resting comfortably CV:  Regular rate and rhythm with no murmurs appreciated Pulm: Normal work of breathing, clear to auscultation bilaterally with no crackles, wheezes, or rhonchi Skin: Eczematous rash on lower extremities with a few scattered small 5 to 10 mm erythematous papules with excoriation scattered on extremities and torso Neuro: Grossly normal, moves all extremities Psych: Normal affect and thought content      Trameka Dorough M. [name], MD 07/19/2022 8:25 AM

## 2022-07-19 NOTE — Assessment & Plan Note (Signed)
Initially elevated but at goal on recheck.  Continue HCTZ 25 mg daily.  She will continue to monitor at home and let us know if persistently elevated.

## 2022-07-19 NOTE — Assessment & Plan Note (Signed)
Last A1c's 6.7.  Do not anticipate any significant issues with steroid burst as above.  Will recheck A1c in 3 to 6 months.  Not currently on any medications.

## 2022-07-21 ENCOUNTER — Other Ambulatory Visit: Payer: Self-pay | Admitting: *Deleted

## 2022-07-21 ENCOUNTER — Other Ambulatory Visit: Payer: Self-pay | Admitting: Family Medicine

## 2022-07-21 ENCOUNTER — Telehealth: Payer: Self-pay | Admitting: *Deleted

## 2022-07-21 MED ORDER — PREDNISONE 20 MG PO TABS: 20.0000 mg | ORAL_TABLET | Freq: Two times a day (BID) | ORAL | 0 refills | Status: AC

## 2022-07-21 NOTE — Telephone Encounter (Signed)
If her rash has improved then we do not need to do anything else.  If she is still having her rash then we can send in 20 mg daily x 5 days but it is very important for her to make sure she is taking medications as prescribed and she should always verify the medication and pill count with the pharmacy.  Kristy Trujillo. [name], MD 07/21/2022 3:12 PM

## 2022-07-21 NOTE — Telephone Encounter (Signed)
Patient stated still with rash on back Rx send to pharmacy  Advise take 1 pill bid for 5 days  Patient teach back

## 2022-07-21 NOTE — Telephone Encounter (Signed)
Patient called stated had Rx prednisone twice per day since prescription was given on 05/252/2024 Was supposed to take one pill per day and now she do not have any pills left  Informed patient if she took two day two pill she supposed to have 6 pills left  Patient argue she do not have any more left and does not know what happen with the pills maybe pharmacist did not give her #10 Please advise

## 2022-08-08 ENCOUNTER — Ambulatory Visit (INDEPENDENT_AMBULATORY_CARE_PROVIDER_SITE_OTHER): Payer: No Typology Code available for payment source | Admitting: Family Medicine

## 2022-08-08 ENCOUNTER — Ambulatory Visit: Payer: No Typology Code available for payment source | Admitting: Family Medicine

## 2022-08-08 ENCOUNTER — Encounter: Payer: Self-pay | Admitting: Family Medicine

## 2022-08-08 VITALS — BP 150/82 | HR 72 | Temp 98.4°F | Ht 62.0 in | Wt 158.2 lb

## 2022-08-08 DIAGNOSIS — R300 Dysuria: Secondary | ICD-10-CM | POA: Diagnosis not present

## 2022-08-08 DIAGNOSIS — N3001 Acute cystitis with hematuria: Secondary | ICD-10-CM | POA: Diagnosis not present

## 2022-08-08 LAB — POCT URINALYSIS DIPSTICK
Bilirubin, UA: NEGATIVE
Blood, UA: POSITIVE
Glucose, UA: NEGATIVE
Ketones, UA: NEGATIVE
Nitrite, UA: NEGATIVE
Protein, UA: POSITIVE — AB
Spec Grav, UA: 1.015 (ref 1.010–1.025)
Urobilinogen, UA: 0.2 E.U./dL
pH, UA: 6 (ref 5.0–8.0)

## 2022-08-08 MED ORDER — NITROFURANTOIN MONOHYD MACRO 100 MG PO CAPS: 100.0000 mg | ORAL_CAPSULE | Freq: Two times a day (BID) | ORAL | 0 refills | Status: DC

## 2022-08-08 MED ORDER — PHENAZOPYRIDINE HCL 100 MG PO TABS: 100.0000 mg | ORAL_TABLET | Freq: Three times a day (TID) | ORAL | 0 refills | Status: DC | PRN

## 2022-08-08 NOTE — Patient Instructions (Addendum)
[facility] Address [address] [place], [place], [place] [address]   Phone Number [phone]  Dr. Sherron Monday

## 2022-08-08 NOTE — Progress Notes (Signed)
Subjective:     Patient ID: Kristy Trujillo, female    DOB: [birth year], 72 y.o.   MRN: [id number]  Chief Complaint  Patient presents with   Dysuria    Pain prior to urinating and when urinating.   Urinary Retention    Urge to go but unable to at times.   Urinary Tract Infection    Symptoms for about a week.    HPI  Gets urinary tract infection frequently.   Has seen urologist for incontinence.  Had Procedure and since then, urine and bowel leakage 3-4x/week(s).  Awaiting appointment for another urological TBD.  -Dysuria and pain prior, hesitancy for 1 week(s).  Some back pain.  No fevers/chills.  No vomiting.  Some loose stools.  Not taking any medications.    Health Maintenance Due  Topic Date Due   FOOT EXAM  Never done   OPHTHALMOLOGY EXAM  Never done   Diabetic kidney evaluation - Urine ACR  Never done   Hepatitis C Screening  Never done   Colonoscopy  Never done   DTaP/Tdap/Td (2 - Td or Tdap) 05/05/2022   Medicare Annual Wellness (AWV)  10/07/2022    Past Medical History:  Diagnosis Date   Gallstones    Shingles     Past Surgical History:  Procedure Laterality Date   CHOLECYSTECTOMY       Current Outpatient Medications:    nitrofurantoin, macrocrystal-monohydrate, (MACROBID) 100 MG capsule, Take 1 capsule (100 mg total) by mouth 2 (two) times daily., Disp: 14 capsule, Rfl: 0   phenazopyridine (PYRIDIUM) 100 MG tablet, Take 1 tablet (100 mg total) by mouth 3 (three) times daily as needed for pain., Disp: 6 tablet, Rfl: 0   atorvastatin (LIPITOR) 40 MG tablet, Take 1 tablet (40 mg total) by mouth daily., Disp: 90 tablet, Rfl: 1   benzonatate (TESSALON PERLES) 100 MG capsule, Take 1 capsule (100 mg total) by mouth 3 (three) times daily as needed for cough., Disp: 30 capsule, Rfl: 0   clonazePAM (KLONOPIN) 1 MG tablet, Take 1 tablet (1 mg total) by mouth 2 (two) times daily as needed. for anxiety, Disp: 180 tablet, Rfl: 1   FLUoxetine (PROZAC) 40 MG capsule, Take 1  capsule by mouth once daily, Disp: 90 capsule, Rfl: 0   gabapentin (NEURONTIN) 300 MG capsule, TAKE 1 CAPSULE BY MOUTH THREE TIMES DAILY AS NEEDED (FOR ANXIETY, SLEEP)., Disp: 30 capsule, Rfl: 0   hydrochlorothiazide (HYDRODIURIL) 25 MG tablet, Take 1 tablet (25 mg total) by mouth daily., Disp: 90 tablet, Rfl: 3   hydrOXYzine (ATARAX) 25 MG tablet, TAKE 1 TABLET BY MOUTH THREE TIMES DAILY, Disp: 90 tablet, Rfl: 0   levofloxacin (LEVAQUIN) 250 MG tablet, Take 1 tablet (250 mg total) by mouth daily., Disp: 3 tablet, Rfl: 0   mirabegron ER (MYRBETRIQ) 25 MG TB24 tablet, Take 1 tablet (25 mg total) by mouth daily., Disp: 90 tablet, Rfl: 0   Multiple Vitamins-Minerals (MULTIVITAMINS THER. W/MINERALS) TABS, Take 1 tablet by mouth daily., Disp: , Rfl:    OVER THE COUNTER MEDICATION, Take 2 tablets by mouth every morning. OTC allergy and sinus tablets, Disp: , Rfl:    triamcinolone cream (KENALOG) 0.5 %, Apply 1 Application topically 3 (three) times daily., Disp: 30 g, Rfl: 0   valACYclovir (VALTREX) 1000 MG tablet, Take 1 tablet (1,000 mg total) by mouth 3 (three) times daily., Disp: 21 tablet, Rfl: 0  Allergies  Allergen Reactions   Alcohol Detox [Nutritional Supplements]  Flushing (cough medicine)   Codeine Swelling   Erythromycin Swelling   ROS neg/noncontributory except as noted HPI/below      Objective:     BP (!) 150/82 (BP Location: Left Arm, Patient Position: Sitting)   Pulse 72   Temp 98.4 F (36.9 C) (Temporal)   Ht 5\' 2"  (1.575 m)   Wt 158 lb 3.2 oz (71.8 kg)   SpO2 98%   BMI 28.94 kg/m  Wt Readings from Last 3 Encounters:  08/08/22 158 lb 3.2 oz (71.8 kg)  07/19/22 152 lb 12.8 oz (69.3 kg)  07/13/22 156 lb 9.6 oz (71 kg)    Physical Exam   Gen: WDWN NAD HEENT: NCAT, conjunctiva not injected, sclera nonicteric ABDOMEN:  BS+, soft,mildly tender suprapubically, No HSM, no masses. No cvat EXT:  no edema MSK: no gross abnormalities.  NEURO: A&O x3.  CN II-XII  intact.  PSYCH: normal mood. Good eye contact  Results for orders placed or performed in visit on 08/08/22  POCT Urinalysis Dipstick  Result Value Ref Range   Color, UA YELLOW    Clarity, UA CLOUDY    Glucose, UA Negative Negative   Bilirubin, UA NEG    Ketones, UA NEG    Spec Grav, UA 1.015 1.010 - 1.025   Blood, UA POS    pH, UA 6.0 5.0 - 8.0   Protein, UA Positive (A) Negative   Urobilinogen, UA 0.2 0.2 or 1.0 E.U./dL   Nitrite, UA NEG    Leukocytes, UA Large (3+) (A) Negative   Appearance     Odor          Assessment & Plan:  Acute cystitis with hematuria  Dysuria -     POCT urinalysis dipstick -     Urine Culture  Other orders -     Nitrofurantoin Monohyd Macro; Take 1 capsule (100 mg total) by mouth 2 (two) times daily.  Dispense: 14 capsule; Refill: 0 -     Phenazopyridine HCl; Take 1 tablet (100 mg total) by mouth 3 (three) times daily as needed for pain.  Dispense: 6 tablet; Refill: 0   Urinary tract infection-problem from fecal leakage.  Reviewed previous cultures.  Will check culture.  Macrobid twice daily and pyridium 100 mg three times daily as needed   gave patient contact info for urology referral.  She will call  Return if symptoms worsen or fail to improve.  Angelena Sole, MD

## 2022-08-10 LAB — URINE CULTURE
MICRO NUMBER:: 15067960
SPECIMEN QUALITY:: ADEQUATE

## 2022-08-21 ENCOUNTER — Encounter: Payer: Self-pay | Admitting: Family Medicine

## 2022-08-21 ENCOUNTER — Ambulatory Visit (INDEPENDENT_AMBULATORY_CARE_PROVIDER_SITE_OTHER): Payer: No Typology Code available for payment source | Admitting: Family Medicine

## 2022-08-21 VITALS — BP 159/83 | HR 77 | Temp 97.7°F | Ht 62.0 in | Wt 159.6 lb

## 2022-08-21 DIAGNOSIS — E119 Type 2 diabetes mellitus without complications: Secondary | ICD-10-CM

## 2022-08-21 DIAGNOSIS — N390 Urinary tract infection, site not specified: Secondary | ICD-10-CM | POA: Diagnosis not present

## 2022-08-21 DIAGNOSIS — I152 Hypertension secondary to endocrine disorders: Secondary | ICD-10-CM | POA: Diagnosis not present

## 2022-08-21 DIAGNOSIS — E1159 Type 2 diabetes mellitus with other circulatory complications: Secondary | ICD-10-CM | POA: Diagnosis not present

## 2022-08-21 DIAGNOSIS — N3281 Overactive bladder: Secondary | ICD-10-CM | POA: Diagnosis not present

## 2022-08-21 LAB — POCT URINALYSIS DIPSTICK
Bilirubin, UA: NEGATIVE
Blood, UA: POSITIVE
Glucose, UA: NEGATIVE
Ketones, UA: NEGATIVE
Nitrite, UA: NEGATIVE
Protein, UA: POSITIVE — AB
Spec Grav, UA: 1.015 (ref 1.010–1.025)
Urobilinogen, UA: 0.2 E.U./dL
pH, UA: 6 (ref 5.0–8.0)

## 2022-08-21 LAB — MICROALBUMIN / CREATININE URINE RATIO
Creatinine,U: 36.5 mg/dL
Microalb Creat Ratio: 208.5 mg/g — ABNORMAL HIGH (ref 0.0–30.0)
Microalb, Ur: 76.1 mg/dL — ABNORMAL HIGH (ref 0.0–1.9)

## 2022-08-21 MED ORDER — CIPROFLOXACIN HCL 250 MG PO TABS: 250.0000 mg | ORAL_TABLET | Freq: Two times a day (BID) | ORAL | 0 refills | Status: AC

## 2022-08-21 NOTE — Patient Instructions (Addendum)
It was very nice to see you today!  Please start the antibiotic until we get your urine culture results back.  Please keep an eye on your blood pressure and let us know if it is persistently elevated.  No follow-ups on file.   Take care, Dr [name]  PLEASE NOTE:  If you had any lab tests, please let us know if you have not heard back within a few days. You may see your results on mychart before we have a chance to review them but we will give you a call once they are reviewed by [place].   If we ordered any referrals today, please let us know if you have not heard from their office within the next week.   If you had any urgent prescriptions sent in today, please check with the pharmacy within an hour of our visit to make sure the prescription was transmitted appropriately.   Please try these tips to maintain a healthy lifestyle:  Eat at least 3 REAL meals and 1-2 snacks per day.  Aim for no more than 5 hours between eating.  If you eat breakfast, please do so within one hour of getting up.   Each meal should contain half fruits/vegetables, one quarter protein, and one quarter carbs (no bigger than a computer mouse)  Cut down on sweet beverages. This includes juice, soda, and sweet tea.   Drink at least 1 glass of water with each meal and aim for at least 8 glasses per day  Exercise at least 150 minutes every week.

## 2022-08-21 NOTE — Progress Notes (Signed)
   Kristy Trujillo is a 72 y.o. female who presents today for an office visit.  Assessment/Plan:  New/Acute Problems: Urinary tract infection No red flags.  Symptoms and UA consistent with UTI.  She will be following up with urology again in a few weeks.  Most recent urine culture showed E. coli sensitive to Macrobid and fluoroquinolones.  Given her recent course of Macrobid we will try course of ciprofloxacin.  She has previously done well with this.  We encouraged hydration.  We discussed reasons to return to care.  Chronic Problems Addressed Today: Hypertension associated with diabetes (HCC) Elevated today though typically well-controlled.  May be elevated in setting of UTI.  Will continue HCTZ 25 mg daily.  She can continue to monitor at home and let us know if persistently elevated.  OAB (overactive bladder) Will be seeing urology again in a few weeks.  On Myrbetriq 25 mg daily.     Subjective:  HPI:  See Assessment / plan for status of chronic conditions.  Patient is here today for concern for UTI.  Saw different provider here 13 days ago with symptoms.  Culture was positive for E. coli.  She was given Macrobid 100 mg twice daily for 7 days.  Symptoms improved but started back a couple of days. She is having dysuria No fevers or chills.        Objective:  Physical Exam: BP (!) 159/83   Pulse 77   Temp 97.7 F (36.5 C) (Temporal)   Ht 5\' 2"  (1.575 m)   Wt 159 lb 9.6 oz (72.4 kg)   SpO2 100%   BMI 29.19 kg/m   Gen: No acute distress, resting comfortably Neuro: Grossly normal, moves all extremities Psych: Normal affect and thought content      Virginio Isidore M. [name], MD 08/21/2022 1:46 PM

## 2022-08-21 NOTE — Assessment & Plan Note (Signed)
Elevated today though typically well-controlled.  May be elevated in setting of UTI.  Will continue HCTZ 25 mg daily.  She can continue to monitor at home and let us know if persistently elevated.

## 2022-08-21 NOTE — Assessment & Plan Note (Signed)
Will be seeing urology again in a few weeks.  On Myrbetriq 25 mg daily.

## 2022-08-22 LAB — URINE CULTURE: SPECIMEN QUALITY:: ADEQUATE

## 2022-08-25 LAB — URINE CULTURE: MICRO NUMBER:: 15119010

## 2022-08-25 NOTE — Progress Notes (Signed)
Her urine culture is negative.  She should let us know if symptoms are not improving or worsening and we can have her see urology.  Katina Degree. [name], MD 08/25/2022 1:03 PM

## 2022-08-30 ENCOUNTER — Other Ambulatory Visit: Payer: Self-pay | Admitting: Family Medicine

## 2022-09-27 ENCOUNTER — Encounter (INDEPENDENT_AMBULATORY_CARE_PROVIDER_SITE_OTHER): Payer: Self-pay

## 2022-10-12 ENCOUNTER — Encounter (INDEPENDENT_AMBULATORY_CARE_PROVIDER_SITE_OTHER): Payer: Self-pay

## 2022-10-13 ENCOUNTER — Ambulatory Visit: Payer: No Typology Code available for payment source | Admitting: Family Medicine

## 2022-10-16 ENCOUNTER — Ambulatory Visit (INDEPENDENT_AMBULATORY_CARE_PROVIDER_SITE_OTHER): Payer: No Typology Code available for payment source

## 2022-10-16 VITALS — Wt 159.0 lb

## 2022-10-16 DIAGNOSIS — Z0000 Encounter for general adult medical examination without abnormal findings: Secondary | ICD-10-CM

## 2022-10-16 NOTE — Patient Instructions (Signed)
[name] , Thank you for taking time to come for your Medicare Wellness Visit. I appreciate your ongoing commitment to your health goals. Please review the following plan we discussed and let me know if I can assist you in the future.   Referrals/Orders/Follow-Ups/Clinician Recommendations: Get in better health, NCEYES application sent along with information for free diabetic eye exams  at This office [address] [place] [address] [phone] with Dorene Grebe on Sept 26, 2024 from 10-4 pm  This is a list of the screening recommended for you and due dates:  Health Maintenance  Topic Date Due   Complete foot exam   Never done   Eye exam for diabetics  Never done   Zoster (Shingles) Vaccine (1 of 2) Never done   DTaP/Tdap/Td vaccine (2 - Td or Tdap) 05/05/2022   Medicare Annual Wellness Visit  10/07/2022   Flu Shot  09/28/2022   Mammogram  12/16/2022*   DEXA scan (bone density measurement)  12/16/2022*   Pneumonia Vaccine (1 of 1 - PCV) 07/13/2023*   Colon Cancer Screening  08/21/2023*   Hepatitis C Screening  08/21/2023*   Yearly kidney function blood test for diabetes  12/30/2022   Hemoglobin A1C  01/13/2023   Yearly kidney health urinalysis for diabetes  08/21/2023   HPV Vaccine  Aged Out   COVID-19 Vaccine  Discontinued  *Topic was postponed. The date shown is not the original due date.    Advanced directives: (Declined) Advance directive discussed with you today. Even though you declined this today, please call our office should you change your mind, and we can give you the proper paperwork for you to fill out.  Next Medicare Annual Wellness Visit scheduled for next year: Yes  Preventive Care 73 Years and Older, Female Preventive care refers to lifestyle choices and visits with your health care provider that can promote health and wellness. What does preventive care include? A yearly physical exam. This is also called an annual well check. Dental exams once or twice a  year. Routine eye exams. Ask your health care provider how often you should have your eyes checked. Personal lifestyle choices, including: Daily care of your teeth and gums. Regular physical activity. Eating a healthy diet. Avoiding tobacco and drug use. Limiting alcohol use. Practicing safe sex. Taking low-dose aspirin every day. Taking vitamin and mineral supplements as recommended by your health care provider. What happens during an annual well check? The services and screenings done by your health care provider during your annual well check will depend on your age, overall health, lifestyle risk factors, and family history of disease. Counseling  Your health care provider may ask you questions about your: Alcohol use. Tobacco use. Drug use. Emotional well-being. Home and relationship well-being. Sexual activity. Eating habits. History of falls. Memory and ability to understand (cognition). Work and work Astronomer. Reproductive health. Screening  You may have the following tests or measurements: Height, weight, and BMI. Blood pressure. Lipid and cholesterol levels. These may be checked every 5 years, or more frequently if you are over 58 years old. Skin check. Lung cancer screening. You may have this screening every year starting at age 7 if you have a 30-pack-year history of smoking and currently smoke or have quit within the past 15 years. Fecal occult blood test (FOBT) of the stool. You may have this test every year starting at age 67. Flexible sigmoidoscopy or colonoscopy. You may have a sigmoidoscopy every 5 years or a colonoscopy every  10 years starting at age 28. Hepatitis C blood test. Hepatitis B blood test. Sexually transmitted disease (STD) testing. Diabetes screening. This is done by checking your blood sugar (glucose) after you have not eaten for a while (fasting). You may have this done every 1-3 years. Bone density scan. This is done to screen for  osteoporosis. You may have this done starting at age 80. Mammogram. This may be done every 1-2 years. Talk to your health care provider about how often you should have regular mammograms. Talk with your health care provider about your test results, treatment options, and if necessary, the need for more tests. Vaccines  Your health care provider may recommend certain vaccines, such as: Influenza vaccine. This is recommended every year. Tetanus, diphtheria, and acellular pertussis (Tdap, Td) vaccine. You may need a Td booster every 10 years. Zoster vaccine. You may need this after age 81. Pneumococcal 13-valent conjugate (PCV13) vaccine. One dose is recommended after age 56. Pneumococcal polysaccharide (PPSV23) vaccine. One dose is recommended after age 89. Talk to your health care provider about which screenings and vaccines you need and how often you need them. This information is not intended to replace advice given to you by your health care provider. Make sure you discuss any questions you have with your health care provider. Document Released: 03/12/2015 Document Revised: 11/03/2015 Document Reviewed: 12/15/2014 Elsevier Interactive Patient Education  2017 [employer].  Fall Prevention in the Home Falls can cause injuries. They can happen to people of all ages. There are many things you can do to make your home safe and to help prevent falls. What can I do on the outside of my home? Regularly fix the edges of walkways and driveways and fix any cracks. Remove anything that might make you trip as you walk through a door, such as a raised step or threshold. Trim any bushes or trees on the path to your home. Use bright outdoor lighting. Clear any walking paths of anything that might make someone trip, such as rocks or tools. Regularly check to see if handrails are loose or broken. Make sure that both sides of any steps have handrails. Any raised decks and porches should have guardrails on  the edges. Have any leaves, snow, or ice cleared regularly. Use sand or salt on walking paths during winter. Clean up any spills in your garage right away. This includes oil or grease spills. What can I do in the bathroom? Use night lights. Install grab bars by the toilet and in the tub and shower. Do not use towel bars as grab bars. Use non-skid mats or decals in the tub or shower. If you need to sit down in the shower, use a plastic, non-slip stool. Keep the floor dry. Clean up any water that spills on the floor as soon as it happens. Remove soap buildup in the tub or shower regularly. Attach bath mats securely with double-sided non-slip rug tape. Do not have throw rugs and other things on the floor that can make you trip. What can I do in the bedroom? Use night lights. Make sure that you have a light by your bed that is easy to reach. Do not use any sheets or blankets that are too big for your bed. They should not hang down onto the floor. Have a firm chair that has side arms. You can use this for support while you get dressed. Do not have throw rugs and other things on the floor that can make you  trip. What can I do in the kitchen? Clean up any spills right away. Avoid walking on wet floors. Keep items that you use a lot in easy-to-reach places. If you need to reach something above you, use a strong step stool that has a grab bar. Keep electrical cords out of the way. Do not use floor polish or wax that makes floors slippery. If you must use wax, use non-skid floor wax. Do not have throw rugs and other things on the floor that can make you trip. What can I do with my stairs? Do not leave any items on the stairs. Make sure that there are handrails on both sides of the stairs and use them. Fix handrails that are broken or loose. Make sure that handrails are as long as the stairways. Check any carpeting to make sure that it is firmly attached to the stairs. Fix any carpet that is loose  or worn. Avoid having throw rugs at the top or bottom of the stairs. If you do have throw rugs, attach them to the floor with carpet tape. Make sure that you have a light switch at the top of the stairs and the bottom of the stairs. If you do not have them, ask someone to add them for you. What else can I do to help prevent falls? Wear shoes that: Do not have high heels. Have rubber bottoms. Are comfortable and fit you well. Are closed at the toe. Do not wear sandals. If you use a stepladder: Make sure that it is fully opened. Do not climb a closed stepladder. Make sure that both sides of the stepladder are locked into place. Ask someone to hold it for you, if possible. Clearly mark and make sure that you can see: Any grab bars or handrails. First and last steps. Where the edge of each step is. Use tools that help you move around (mobility aids) if they are needed. These include: Canes. Walkers. Scooters. Crutches. Turn on the lights when you go into a dark area. Replace any light bulbs as soon as they burn out. Set up your furniture so you have a clear path. Avoid moving your furniture around. If any of your floors are uneven, fix them. If there are any pets around you, be aware of where they are. Review your medicines with your doctor. Some medicines can make you feel dizzy. This can increase your chance of falling. Ask your doctor what other things that you can do to help prevent falls. This information is not intended to replace advice given to you by your health care provider. Make sure you discuss any questions you have with your health care provider. Document Released: 12/10/2008 Document Revised: 07/22/2015 Document Reviewed: 03/20/2014 Elsevier Interactive Patient Education  2017 [employer].

## 2022-10-16 NOTE — Progress Notes (Signed)
Subjective:   Kristy Trujillo is a 72 y.o. female who presents for Medicare Annual (Subsequent) preventive examination.  Visit Complete: Virtual  I connected with  Kristy Trujillo on 10/16/22 by a audio enabled telemedicine application and verified that I am speaking with the correct person using two identifiers.  Patient Location: Home  Provider Location: Office/Clinic  I discussed the limitations of evaluation and management by telemedicine. The patient expressed understanding and agreed to proceed.  Vital Signs: Unable to obtain new vitals due to this being a telehealth visit.   Review of Systems     Cardiac Risk Factors include: advanced age (>31men, >14 women);diabetes mellitus;dyslipidemia;hypertension     Objective:    Today's Vitals   10/16/22 0943  Weight: 159 lb (72.1 kg)   Body mass index is 29.08 kg/m.     10/16/2022    9:49 AM 11/02/2021    2:23 PM 10/06/2021    2:06 PM 02/01/2021    2:10 PM 06/12/2019   11:31 AM  Advanced Directives  Does Patient Have a Medical Advance Directive? No Yes No No No  Type of Advance Directive  Living will     Would patient like information on creating a medical advance directive? No - Patient declined  No - Patient declined No - Patient declined Yes (MAU/Ambulatory/Procedural Areas - Information given)    Current Medications (verified) Outpatient Encounter Medications as of 10/16/2022  Medication Sig   atorvastatin (LIPITOR) 40 MG tablet Take 1 tablet by mouth once daily   clonazePAM (KLONOPIN) 1 MG tablet Take 1 tablet (1 mg total) by mouth 2 (two) times daily as needed. for anxiety   FLUoxetine (PROZAC) 40 MG capsule Take 1 capsule by mouth once daily   gabapentin (NEURONTIN) 300 MG capsule TAKE 1 CAPSULE BY MOUTH THREE TIMES DAILY AS NEEDED (FOR ANXIETY, SLEEP).   hydrochlorothiazide (HYDRODIURIL) 25 MG tablet Take 1 tablet (25 mg total) by mouth daily.   hydrOXYzine (ATARAX) 25 MG tablet TAKE 1 TABLET BY MOUTH THREE TIMES  DAILY   mirabegron ER (MYRBETRIQ) 25 MG TB24 tablet Take 1 tablet (25 mg total) by mouth daily.   Multiple Vitamins-Minerals (MULTIVITAMINS THER. W/MINERALS) TABS Take 1 tablet by mouth daily.   nitrofurantoin, macrocrystal-monohydrate, (MACROBID) 100 MG capsule Take 1 capsule (100 mg total) by mouth 2 (two) times daily.   OVER THE COUNTER MEDICATION Take 2 tablets by mouth every morning. OTC allergy and sinus tablets   triamcinolone cream (KENALOG) 0.5 % Apply 1 Application topically 3 (three) times daily.   phenazopyridine (PYRIDIUM) 100 MG tablet Take 1 tablet (100 mg total) by mouth 3 (three) times daily as needed for pain. (Patient not taking: Reported on 08/21/2022)   No facility-administered encounter medications on file as of 10/16/2022.    Allergies (verified) Alcohol detox [nutritional supplements], Codeine, and Erythromycin   History: Past Medical History:  Diagnosis Date   Gallstones    Shingles    Past Surgical History:  Procedure Laterality Date   CHOLECYSTECTOMY     Family History  Problem Relation Age of Onset   Diabetes Neg Hx    High Cholesterol Neg Hx    Hypertension Neg Hx    Colon cancer Neg Hx    Breast cancer Neg Hx    Social History   Socioeconomic History   Marital status: Divorced    Spouse name: Not on file   Number of children: 2   Years of education: Not on file   Highest education  level: Not on file  Occupational History   Occupation: Retired     Comment: Holiday representative   Tobacco Use   Smoking status: Never   Smokeless tobacco: Never  Substance and Sexual Activity   Alcohol use: No   Drug use: No   Sexual activity: Not on file  Other Topics Concern   Not on file  Social History Narrative   Left Handed    Lives in a one story home   Social Determinants of Health   Financial Resource Strain: Low Risk  (10/16/2022)   Overall Financial Resource Strain (CARDIA)    Difficulty of Paying Living Expenses: Not hard at all  Food Insecurity:  No Food Insecurity (10/16/2022)   Hunger Vital Sign    Worried About Running Out of Food in the Last Year: Never true    Ran Out of Food in the Last Year: Never true  Transportation Needs: No Transportation Needs (10/16/2022)   PRAPARE - Administrator, Civil Service (Medical): No    Lack of Transportation (Non-Medical): No  Physical Activity: Inactive (10/16/2022)   Exercise Vital Sign    Days of Exercise per Week: 0 days    Minutes of Exercise per Session: 0 min  Stress: No Stress Concern Present (10/16/2022)   [employer] of Occupational Health - Occupational Stress Questionnaire    Feeling of Stress : Not at all  Social Connections: Moderately Integrated (10/16/2022)   Social Connection and Isolation Panel [NHANES]    Frequency of Communication with Friends and Family: More than three times a week    Frequency of Social Gatherings with Friends and Family: More than three times a week    Attends Religious Services: More than 4 times per year    Active Member of [employer] or Organizations: Yes    Attends Banker Meetings: 1 to 4 times per year    Marital Status: Divorced    Tobacco Counseling Counseling given: Not Answered   Clinical Intake:  Pre-visit preparation completed: Yes  Pain : No/denies pain     BMI - recorded: 29.08 Nutritional Status: BMI 25 -29 Overweight Nutritional Risks: None Diabetes: Yes CBG done?: No Did pt. bring in CBG monitor from home?: No  How often do you need to have someone help you when you read instructions, pamphlets, or other written materials from your doctor or pharmacy?: 1 - Never  Interpreter Needed?: No  Information entered by :: Lanier Ensign, LPN   Activities of Daily Living    10/16/2022    9:44 AM  In your present state of health, do you have any difficulty performing the following activities:  Hearing? 0  Vision? 0  Difficulty concentrating or making decisions? 0  Walking or climbing stairs? 0   Dressing or bathing? 0  Doing errands, shopping? 0  Preparing Food and eating ? N  Using the Toilet? N  In the past six months, have you accidently leaked urine? Y  Comment wears a brief  Do you have problems with loss of bowel control? Y  Comment wears a brief  Managing your Medications? N  Managing your Finances? N  Housekeeping or managing your Housekeeping? N    Patient Care Team: Ardith Dark, MD as PCP - General (Family Medicine) Antony Madura, MD as Consulting Physician (Neurology)  Indicate any recent Medical Services you may have received from other than [facility] providers in the past year (date may be approximate).     Assessment:  This is a routine wellness examination for Kristy Trujillo.  Hearing/Vision screen Hearing Screening - Comments:: Pt denies any hearing issues  Vision Screening - Comments:: Pt encouraged to follow up   Dietary issues and exercise activities discussed:     Goals Addressed             This Visit's Progress    Patient Stated       Get in better health        Depression Screen    10/16/2022    9:48 AM 08/21/2022    1:15 PM 07/19/2022    7:28 AM 07/13/2022   11:14 AM 05/03/2022    4:13 PM 12/15/2021   12:15 PM 10/06/2021    2:04 PM  PHQ 2/9 Scores  PHQ - 2 Score 0 0 0 0 0 0 0  PHQ- 9 Score 0 0   2 2     Fall Risk    10/16/2022    9:51 AM 08/21/2022    1:15 PM 07/13/2022   11:14 AM 12/15/2021   12:13 PM 11/02/2021    2:23 PM  Fall Risk   Falls in the past year? 0 0 0 0 1  Number falls in past yr: 0 0 0 0 1  Injury with Fall? 0 0 0 0 0  Risk for fall due to : Impaired vision;Impaired balance/gait No Fall Risks No Fall Risks No Fall Risks   Follow up Falls prevention discussed        MEDICARE RISK AT HOME: Medicare Risk at Home Any stairs in or around the home?: No If so, are there any without handrails?: No Home free of loose throw rugs in walkways, pet beds, electrical cords, etc?: Yes Adequate lighting in your home to reduce  risk of falls?: Yes Life alert?: No Use of a cane, walker or w/c?: No Grab bars in the bathroom?: No Shower chair or bench in shower?: No Elevated toilet seat or a handicapped toilet?: No  TIMED UP AND GO:  Was the test performed?  No    Cognitive Function:        10/16/2022    9:52 AM 06/12/2019   11:32 AM  6CIT Screen  What Year? 0 points 0 points  What month? 0 points 0 points  What time? 0 points 0 points  Count back from 20 0 points 0 points  Months in reverse 0 points 0 points  Repeat phrase 0 points 0 points  Total Score 0 points 0 points    Immunizations Immunization History  Administered Date(s) Administered   Fluad Quad(high Dose 65+) 12/23/2018   PFIZER(Purple Top)SARS-COV-2 Vaccination 03/27/2019, 04/24/2019   Tdap 05/04/2012    TDAP status: Due, Education has been provided regarding the importance of this vaccine. Advised may receive this vaccine at local pharmacy or Health Dept. Aware to provide a copy of the vaccination record if obtained from local pharmacy or Health Dept. Verbalized acceptance and understanding.  Flu Vaccine status: Due, Education has been provided regarding the importance of this vaccine. Advised may receive this vaccine at local pharmacy or Health Dept. Aware to provide a copy of the vaccination record if obtained from local pharmacy or Health Dept. Verbalized acceptance and understanding.  Pneumococcal vaccine status: Declined,  Education has been provided regarding the importance of this vaccine but patient still declined. Advised may receive this vaccine at local pharmacy or Health Dept. Aware to provide a copy of the vaccination record if obtained from local pharmacy or Health Dept.  Verbalized acceptance and understanding.   Covid-19 vaccine status: Declined, Education has been provided regarding the importance of this vaccine but patient still declined. Advised may receive this vaccine at local pharmacy or Health Dept.or vaccine clinic.  Aware to provide a copy of the vaccination record if obtained from local pharmacy or Health Dept. Verbalized acceptance and understanding.  Qualifies for Shingles Vaccine? Yes   Zostavax completed No   Shingrix Completed?: No.    Education has been provided regarding the importance of this vaccine. Patient has been advised to call insurance company to determine out of pocket expense if they have not yet received this vaccine. Advised may also receive vaccine at local pharmacy or Health Dept. Verbalized acceptance and understanding.  Screening Tests Health Maintenance  Topic Date Due   FOOT EXAM  Never done   OPHTHALMOLOGY EXAM  Never done   Zoster Vaccines- Shingrix (1 of 2) Never done   DTaP/Tdap/Td (2 - Td or Tdap) 05/05/2022   INFLUENZA VACCINE  09/28/2022   MAMMOGRAM  12/16/2022 (Originally 10/15/2021)   DEXA SCAN  12/16/2022 (Originally 07/29/2014)   Pneumonia Vaccine 63+ Years old (1 of 1 - PCV) 07/13/2023 (Originally 07/29/2014)   Colonoscopy  08/21/2023 (Originally 07/29/1994)   Hepatitis C Screening  08/21/2023 (Originally 07/29/1967)   Diabetic kidney evaluation - eGFR measurement  12/30/2022   HEMOGLOBIN A1C  01/13/2023   Diabetic kidney evaluation - Urine ACR  08/21/2023   Medicare Annual Wellness (AWV)  10/16/2023   HPV VACCINES  Aged Out   COVID-19 Vaccine  Discontinued    Health Maintenance  Health Maintenance Due  Topic Date Due   FOOT EXAM  Never done   OPHTHALMOLOGY EXAM  Never done   Zoster Vaccines- Shingrix (1 of 2) Never done   DTaP/Tdap/Td (2 - Td or Tdap) 05/05/2022   INFLUENZA VACCINE  09/28/2022    Colonoscopy declined   Mammogram status: Completed 10/16/19. Repeat every year     Additional Screening:  Hepatitis C Screening: does qualify  Vision Screening: Recommended annual ophthalmology exams for early detection of glaucoma and other disorders of the eye. Is the patient up to date with their annual eye exam?  No  Who is the provider or what is the  name of the office in which the patient attends annual eye exams? Information given for free exams and or reduced cost If pt is not established with a provider, would they like to be referred to a provider to establish care? Yes .   Dental Screening: Recommended annual dental exams for proper oral hygiene  Diabetic Foot Exam: Diabetic Foot Exam: Overdue, Pt has been advised about the importance in completing this exam. Pt is scheduled for diabetic foot exam on next appt .  Community Resource Referral / Chronic Care Management: CRR required this visit?  No   CCM required this visit?  No     Plan:     I have personally reviewed and noted the following in the patient's chart:   Medical and social history Use of alcohol, tobacco or illicit drugs  Current medications and supplements including opioid prescriptions. Patient is not currently taking opioid prescriptions. Functional ability and status Nutritional status Physical activity Advanced directives List of other physicians Hospitalizations, surgeries, and ER visits in previous 12 months Vitals Screenings to include cognitive, depression, and falls Referrals and appointments  In addition, I have reviewed and discussed with patient certain preventive protocols, quality metrics, and best practice recommendations. A written personalized care plan for  preventive services as well as general preventive health recommendations were provided to patient.     Marzella Schlein, LPN   1/61/0960   After Visit Summary: (Mail) Due to this being a telephonic visit, the after visit summary with patients personalized plan was offered to patient via mail   Nurse Notes: none

## 2022-10-17 ENCOUNTER — Other Ambulatory Visit: Payer: Self-pay | Admitting: Family Medicine

## 2022-10-17 NOTE — Telephone Encounter (Signed)
Pt requesting refill for Clonazepam 1 mg, Myrbetriq 25 mg. Last OV 08/21/2022.

## 2022-10-18 DIAGNOSIS — N3946 Mixed incontinence: Secondary | ICD-10-CM | POA: Diagnosis not present

## 2022-10-18 DIAGNOSIS — R350 Frequency of micturition: Secondary | ICD-10-CM | POA: Diagnosis not present

## 2022-10-18 DIAGNOSIS — N3944 Nocturnal enuresis: Secondary | ICD-10-CM | POA: Diagnosis not present

## 2022-11-17 ENCOUNTER — Other Ambulatory Visit: Payer: Self-pay | Admitting: Family Medicine

## 2022-11-21 ENCOUNTER — Ambulatory Visit (INDEPENDENT_AMBULATORY_CARE_PROVIDER_SITE_OTHER): Payer: No Typology Code available for payment source | Admitting: Family Medicine

## 2022-11-21 ENCOUNTER — Encounter: Payer: Self-pay | Admitting: Family Medicine

## 2022-11-21 ENCOUNTER — Ambulatory Visit (INDEPENDENT_AMBULATORY_CARE_PROVIDER_SITE_OTHER)
Admission: RE | Admit: 2022-11-21 | Discharge: 2022-11-21 | Disposition: A | Discharge status: Home / Self Care | Payer: No Typology Code available for payment source | Source: Ambulatory Visit | Attending: Family Medicine | Admitting: Family Medicine

## 2022-11-21 VITALS — BP 149/73 | HR 80 | Temp 97.7°F | Ht 62.0 in | Wt 153.8 lb

## 2022-11-21 DIAGNOSIS — M79601 Pain in right arm: Secondary | ICD-10-CM | POA: Diagnosis not present

## 2022-11-21 DIAGNOSIS — E785 Hyperlipidemia, unspecified: Secondary | ICD-10-CM

## 2022-11-21 DIAGNOSIS — E1159 Type 2 diabetes mellitus with other circulatory complications: Secondary | ICD-10-CM | POA: Diagnosis not present

## 2022-11-21 DIAGNOSIS — I152 Hypertension secondary to endocrine disorders: Secondary | ICD-10-CM

## 2022-11-21 DIAGNOSIS — M25521 Pain in right elbow: Secondary | ICD-10-CM | POA: Diagnosis not present

## 2022-11-21 DIAGNOSIS — E1169 Type 2 diabetes mellitus with other specified complication: Secondary | ICD-10-CM | POA: Diagnosis not present

## 2022-11-21 DIAGNOSIS — M1811 Unilateral primary osteoarthritis of first carpometacarpal joint, right hand: Secondary | ICD-10-CM | POA: Diagnosis not present

## 2022-11-21 DIAGNOSIS — M25531 Pain in right wrist: Secondary | ICD-10-CM | POA: Diagnosis not present

## 2022-11-21 DIAGNOSIS — E119 Type 2 diabetes mellitus without complications: Secondary | ICD-10-CM

## 2022-11-21 LAB — POCT GLYCOSYLATED HEMOGLOBIN (HGB A1C): Hemoglobin A1C: 6.7 % — AB (ref 4.0–5.6)

## 2022-11-21 MED ORDER — MUPIROCIN 2 % EX OINT: 1.0000 | TOPICAL_OINTMENT | Freq: Two times a day (BID) | CUTANEOUS | 0 refills | Status: DC

## 2022-11-21 NOTE — Patient Instructions (Signed)
It was very nice to see you today!  We will get x-ray of your elbow and wrist.  Let us know if your pain worsens.  Please use the mupirocin to your skin tear in your toe.  Keep an eye on your blood pressure and let us know if it is persistently elevated.  Return in about 3 months (around 02/20/2023) for Annual Physical.   Take care, Dr [name]  PLEASE NOTE:  If you had any lab tests, please let us know if you have not heard back within a few days. You may see your results on mychart before we have a chance to review them but we will give you a call once they are reviewed by [place].   If we ordered any referrals today, please let us know if you have not heard from their office within the next week.   If you had any urgent prescriptions sent in today, please check with the pharmacy within an hour of our visit to make sure the prescription was transmitted appropriately.   Please try these tips to maintain a healthy lifestyle:  Eat at least 3 REAL meals and 1-2 snacks per day.  Aim for no more than 5 hours between eating.  If you eat breakfast, please do so within one hour of getting up.   Each meal should contain half fruits/vegetables, one quarter protein, and one quarter carbs (no bigger than a computer mouse)  Cut down on sweet beverages. This includes juice, soda, and sweet tea.   Drink at least 1 glass of water with each meal and aim for at least 8 glasses per day  Exercise at least 150 minutes every week.

## 2022-11-21 NOTE — Assessment & Plan Note (Signed)
A1c well-controlled 6.7 via dietary modifications.  She will come back in 3 to 6 months for CPE and we will recheck at that time.

## 2022-11-21 NOTE — Assessment & Plan Note (Signed)
Slightly elevated today though at goal per JNC 8.  May be elevated to 2 acute pain.  She will continue to monitor at home.  Continue HCTZ 25 mg daily.  She will come back soon for CPE and we can recheck at that time though she will let us know if it is persistently elevated between now and her next office visit.

## 2022-11-21 NOTE — Assessment & Plan Note (Signed)
Stable on Lipitor 40 mg daily.  Will check lipids when she comes back for CPE in a few months.

## 2022-11-21 NOTE — Progress Notes (Signed)
   Kristy Trujillo is a 72 y.o. female who presents today for an office visit.  Assessment/Plan:  New/Acute Problems: Arm Pain Likely contusion.  No red flag findings on exam.  Given recent fall we will check plain films to rule out bony fracture.  Suspect this will heal up over the next several days.  She can continue ice and over-the-counter meds as needed.  Depending on results of x-ray and clinical course may need referral to orthopedics or sports medicine.  Skin Tear No signs of cellulitis though will start topical mupirocin for prevention.  We discussed wound care.  She will keep area clean and dry.  Chronic Problems Addressed Today: Diabetes mellitus without complication (HCC) A1c well-controlled 6.7 via dietary modifications.  She will come back in 3 to 6 months for CPE and we will recheck at that time.  Dyslipidemia associated with type 2 diabetes mellitus (HCC) Stable on Lipitor 40 mg daily.  Will check lipids when she comes back for CPE in a few months.  Hypertension associated with diabetes (HCC) Slightly elevated today though at goal per JNC 8.  May be elevated to 2 acute pain.  She will continue to monitor at home.  Continue HCTZ 25 mg daily.  She will come back soon for CPE and we can recheck at that time though she will let us know if it is persistently elevated between now and her next office visit.     Subjective:  HPI:  See A/P for status of chronic conditions.  Patient here today for follow-up.  Last saw her about 3 months ago.  At that time A1c was 6.7, diet controlled.  She is here today for A1c recheck.  She has been working on diet and exercise.  She fell a couple of days ago while at home.  Suffered a mechanical fall and tripped into the wall.  Suffered injury to her right arm.  She states that the right arm fell into the wall.  Now having pain in her elbow and wrist.  No swelling.  No redness.  No treatments tried.  Worse with certain motions such as turning her  wrist.  Also suffered a skin tear on the left second toe during her fall.  She has been try to keep the area clean and dry.       Objective:  Physical Exam: BP (!) 149/73   Pulse 80   Temp 97.7 F (36.5 C) (Temporal)   Ht 5\' 2"  (1.575 m)   Wt 153 lb 12.8 oz (69.8 kg)   SpO2 100%   BMI 28.13 kg/m   Wt Readings from Last 3 Encounters:  11/21/22 153 lb 12.8 oz (69.8 kg)  10/16/22 159 lb (72.1 kg)  08/21/22 159 lb 9.6 oz (72.4 kg)    Gen: No acute distress, resting comfortably CV: Regular rate and rhythm with no murmurs appreciated Pulm: Normal work of breathing, clear to auscultation bilaterally with no crackles, wheezes, or rhonchi MUSCULOSKELETAL: - Right Arm: No deformities.  Nontender to palpation.  No areas of ecchymosis.  Full range of motion throughout.  Tender to palpation at olecranon process and central wrist.  Pain elicited with supination and pronation of hand.  Neuro vascular intact distally. Skin: Small 1 cm laceration on second digit of left foot.  Mild surrounding erythema. Neuro: Grossly normal, moves all extremities Psych: Normal affect and thought content      Thomas Mabry M. [name], MD 11/21/2022 2:20 PM

## 2022-11-22 ENCOUNTER — Ambulatory Visit: Payer: No Typology Code available for payment source | Admitting: Orthopedic Surgery

## 2022-11-22 ENCOUNTER — Other Ambulatory Visit: Payer: Self-pay | Admitting: *Deleted

## 2022-11-22 DIAGNOSIS — S42402A Unspecified fracture of lower end of left humerus, initial encounter for closed fracture: Secondary | ICD-10-CM

## 2022-11-22 MED ORDER — ATORVASTATIN CALCIUM 40 MG PO TABS: 40.0000 mg | ORAL_TABLET | Freq: Every day | ORAL | 0 refills | Status: DC

## 2022-11-22 NOTE — Progress Notes (Signed)
Your x-ray shows that she may have a fracture at one of the bones in her elbow.  This should heal within a few weeks but it is important that we have her see orthopedics ASAP for monitoring. Please place urgent referral or have her go to the walk in clinic at [facility] this morning.

## 2022-11-24 ENCOUNTER — Ambulatory Visit (INDEPENDENT_AMBULATORY_CARE_PROVIDER_SITE_OTHER): Payer: No Typology Code available for payment source | Admitting: Orthopedic Surgery

## 2022-11-24 ENCOUNTER — Encounter: Payer: Self-pay | Admitting: Orthopedic Surgery

## 2022-11-24 DIAGNOSIS — S52134A Nondisplaced fracture of neck of right radius, initial encounter for closed fracture: Secondary | ICD-10-CM

## 2022-11-24 NOTE — Progress Notes (Signed)
Office Visit Note   Patient: Kristy Trujillo           Date of Birth: [birth year]           MRN: [id number] Visit Date: 11/24/2022 Requested by: Ardith Dark, MD [address] [place],  [place] [address] PCP: Ardith Dark, MD  Subjective: Chief Complaint  Patient presents with   Right Elbow - Injury    HPI: Kristy Trujillo is a 72 y.o. female who presents to the office reporting right elbow pain.  Date of injury 11/19/2022 with a fall.  She is left-hand dominant.  Reports level 6 out of 10 pain depending on movement.  She has a sister who lives at home.  Radiographs are reviewed which showed nondisplaced right radial head fracture but this is a transverse fracture which extends across the radial neck..                ROS: All systems reviewed are negative as they relate to the chief complaint within the history of present illness.  Patient denies fevers or chills.  Assessment & Plan: Visit Diagnoses:  1. Closed nondisplaced fracture of neck of right radius, initial encounter     Plan: Impression is right radial neck fracture in a 72 year old patient who wants to avoid surgery at all cost.  Plan is long-arm splint plus sling today with 10-day return with repeat radiographs out of the splint.  Depending on what the fracture looks like at that time we may continue immobilization but in the sling only.  Repeat radiographs on return.  Follow-Up Instructions: Return in about 10 days (around 12/04/2022).   Orders:  No orders of the defined types were placed in this encounter.  No orders of the defined types were placed in this encounter.     Procedures: No procedures performed   Clinical Data: No additional findings.  Objective: Vital Signs: There were no vitals taken for this visit.  Physical Exam:  Constitutional: Patient appears well-developed HEENT:  Head: Normocephalic Eyes:EOM are normal Neck: Normal range of motion Cardiovascular: Normal rate Pulmonary/chest:  Effort normal Neurologic: Patient is alert Skin: Skin is warm Psychiatric: Patient has normal mood and affect  Ortho Exam: Ortho exam demonstrates intact EPL FPL interosseous function.  Radial pulse intact on the right.  Mild ecchymosis noted in the volar forearm.  No mechanical pain with pronation supination flexion extension of the arm.  Specialty Comments:  No specialty comments available.  Imaging: No results found.   PMFS History: Patient Active Problem List   Diagnosis Date Noted   Unsteady gait 09/22/2021   Dyslipidemia associated with type 2 diabetes mellitus (HCC) 07/01/2021   CKD (chronic kidney disease) stage 3, GFR 30-59 ml/min (HCC) 02/10/2021   Diabetes mellitus without complication (HCC) 02/10/2021   Hypertension associated with diabetes (HCC) 12/09/2020   Tinnitus of both ears 12/09/2020   Leg edema 11/23/2020   OAB (overactive bladder) 08/12/2020   Lipoma 03/11/2018   Depression, major, in remission (HCC) 03/11/2018   Anxiety 03/11/2018   Past Medical History:  Diagnosis Date   Gallstones    Shingles     Family History  Problem Relation Age of Onset   Diabetes Neg Hx    High Cholesterol Neg Hx    Hypertension Neg Hx    Colon cancer Neg Hx    Breast cancer Neg Hx     Past Surgical History:  Procedure Laterality Date   CHOLECYSTECTOMY     Social History  Occupational History   Occupation: Retired     Comment: Holiday representative   Tobacco Use   Smoking status: Never   Smokeless tobacco: Never  Substance and Sexual Activity   Alcohol use: No   Drug use: No   Sexual activity: Not on file

## 2022-12-01 ENCOUNTER — Other Ambulatory Visit: Payer: Self-pay | Admitting: Family Medicine

## 2022-12-01 DIAGNOSIS — Z1212 Encounter for screening for malignant neoplasm of rectum: Secondary | ICD-10-CM

## 2022-12-01 DIAGNOSIS — Z1211 Encounter for screening for malignant neoplasm of colon: Secondary | ICD-10-CM

## 2022-12-04 ENCOUNTER — Ambulatory Visit: Payer: No Typology Code available for payment source | Admitting: Orthopedic Surgery

## 2022-12-05 DIAGNOSIS — N3946 Mixed incontinence: Secondary | ICD-10-CM | POA: Diagnosis not present

## 2022-12-05 DIAGNOSIS — R350 Frequency of micturition: Secondary | ICD-10-CM | POA: Diagnosis not present

## 2022-12-05 DIAGNOSIS — N390 Urinary tract infection, site not specified: Secondary | ICD-10-CM | POA: Diagnosis not present

## 2022-12-06 ENCOUNTER — Other Ambulatory Visit (INDEPENDENT_AMBULATORY_CARE_PROVIDER_SITE_OTHER): Payer: No Typology Code available for payment source

## 2022-12-06 ENCOUNTER — Encounter: Payer: Self-pay | Admitting: Surgical

## 2022-12-06 ENCOUNTER — Ambulatory Visit: Payer: No Typology Code available for payment source | Admitting: Surgical

## 2022-12-06 DIAGNOSIS — S52134A Nondisplaced fracture of neck of right radius, initial encounter for closed fracture: Secondary | ICD-10-CM | POA: Diagnosis not present

## 2022-12-06 NOTE — Progress Notes (Signed)
   Post-Fracture Visit Note   Patient: Kristy Trujillo           Date of Birth: 1950-09-20           MRN: [id number] Visit Date: 12/06/2022 PCP: Ardith Dark, MD   Assessment & Plan:  Chief Complaint:  Chief Complaint  Patient presents with   Right Elbow - Follow-up    Radial neck fracture   Visit Diagnoses:  1. Closed nondisplaced fracture of neck of right radius, initial encounter     Plan: Patient is 72 year old female who presents for reevaluation of radial neck fracture of the right arm.  Sustained injury on 11/19/2022.  She has been in a long-arm splint and sling.  Not taking any pain medication.  Her pain is significantly improved.  Just complains of "soreness" in the right elbow.  On exam, patient has 140 degrees of elbow flexion and 10 degrees of elbow extension.  He has no significant block to pronation or supination passively compared with the contralateral side.  Intact EPL, FPL, finger abduction, bicep flexion, tricep extension.  No tenderness throughout the right elbow.  She really has no pain with passive pronation or supination.  Plan is remain in sling and come out of the sling 1 time per day in order to perform elbow range of motion exercises for 20 repetitions.  Return to the sling for the rest the day and no lifting the injured extremity.  Follow-up in approximately 10 days for clinical recheck with Dr. August Saucer and new radiographs at that time to ensure there is no further angulation/displacement at the fracture site.  There was a small change in angulation of the fracture today compared with previous radiographs.  Follow-Up Instructions: No follow-ups on file.   Orders:  Orders Placed This Encounter  Procedures   XR Elbow 2 Views Right   No orders of the defined types were placed in this encounter.   Imaging: No results found.  PMFS History: Patient Active Problem List   Diagnosis Date Noted   Unsteady gait 09/22/2021   Dyslipidemia associated with type 2  diabetes mellitus (HCC) 07/01/2021   CKD (chronic kidney disease) stage 3, GFR 30-59 ml/min (HCC) 02/10/2021   Diabetes mellitus without complication (HCC) 02/10/2021   Hypertension associated with diabetes (HCC) 12/09/2020   Tinnitus of both ears 12/09/2020   Leg edema 11/23/2020   OAB (overactive bladder) 08/12/2020   Lipoma 03/11/2018   Depression, major, in remission (HCC) 03/11/2018   Anxiety 03/11/2018   Past Medical History:  Diagnosis Date   Gallstones    Shingles     Family History  Problem Relation Age of Onset   Diabetes Neg Hx    High Cholesterol Neg Hx    Hypertension Neg Hx    Colon cancer Neg Hx    Breast cancer Neg Hx     Past Surgical History:  Procedure Laterality Date   CHOLECYSTECTOMY     Social History   Occupational History   Occupation: Retired     Comment: Holiday representative   Tobacco Use   Smoking status: Never   Smokeless tobacco: Never  Substance and Sexual Activity   Alcohol use: No   Drug use: No   Sexual activity: Not on file

## 2022-12-15 ENCOUNTER — Encounter: Payer: Self-pay | Admitting: Surgical

## 2022-12-15 ENCOUNTER — Other Ambulatory Visit (INDEPENDENT_AMBULATORY_CARE_PROVIDER_SITE_OTHER): Payer: No Typology Code available for payment source

## 2022-12-15 ENCOUNTER — Ambulatory Visit (INDEPENDENT_AMBULATORY_CARE_PROVIDER_SITE_OTHER): Payer: No Typology Code available for payment source | Admitting: Surgical

## 2022-12-15 DIAGNOSIS — S52134A Nondisplaced fracture of neck of right radius, initial encounter for closed fracture: Secondary | ICD-10-CM | POA: Diagnosis not present

## 2022-12-15 NOTE — Progress Notes (Signed)
Post-fracture visit Note   Patient: Kristy Trujillo           Date of Birth: 1950/04/17           MRN: [id number] Visit Date: 12/15/2022 PCP: Ardith Dark, MD   Assessment & Plan:  Chief Complaint:  Chief Complaint  Patient presents with   Right Elbow - Pain   Visit Diagnoses:  1. Closed nondisplaced fracture of neck of right radius, initial encounter     Plan: Patient is a 72 year old female who presents s/p radial head fracture that was sustained on 11/19/2022.  She has been in a sling for the last 10 days doing some elbow range of motion exercises and then returning to the sling.  Not lifting anything with the arm.  She denies any mechanical symptoms.  Feels that her range of motion is improving.  Pain is improving as well and she only notes occasional soreness in the elbow primarily with pronation/supination of her forearm.  Not taking anything for pain control.  She is in sling today.  On exam, patient has intact EPL, FPL, finger abduction.  She has range of motion of the right elbow with 140 degrees of elbow flexion and about 5 degrees of elbow extension.  Has roughly equivalent pronation and supination of the right elbow compared with the left.  Has a little bit of mild tenderness around the area of the radial head.  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  This is not new and this is chronic for her.  Impression is radial head fracture with callus formation noted and no change in position at the fracture site compared with last radiographs.  Plan is to continue with range of motion exercises.  Okay to discontinue sling but no lifting with the injured extremity.  Follow-up in 3 weeks for clinical recheck with Dr. August Saucer.  Call with any concerns or worsening symptoms.  Follow-Up Instructions: No follow-ups on file.   Orders:  Orders Placed This Encounter  Procedures   XR Elbow Complete Right (3+View)   No orders of  the defined types were placed in this encounter.   Imaging: No results found.  PMFS History: Patient Active Problem List   Diagnosis Date Noted   Unsteady gait 09/22/2021   Dyslipidemia associated with type 2 diabetes mellitus (HCC) 07/01/2021   CKD (chronic kidney disease) stage 3, GFR 30-59 ml/min (HCC) 02/10/2021   Diabetes mellitus without complication (HCC) 02/10/2021   Hypertension associated with diabetes (HCC) 12/09/2020   Tinnitus of both ears 12/09/2020   Leg edema 11/23/2020   OAB (overactive bladder) 08/12/2020   Lipoma 03/11/2018   Depression, major, in remission (HCC) 03/11/2018   Anxiety 03/11/2018   Past Medical History:  Diagnosis Date   Gallstones    Shingles     Family History  Problem Relation Age of Onset   Diabetes Neg Hx    High Cholesterol Neg Hx    Hypertension Neg Hx    Colon cancer Neg Hx    Breast cancer Neg Hx     Past Surgical History:  Procedure Laterality Date   CHOLECYSTECTOMY     Social History   Occupational History   Occupation: Retired     Comment: Holiday representative   Tobacco Use   Smoking status: Never   Smokeless tobacco: Never  Substance and Sexual Activity   Alcohol use: No   Drug use: No   Sexual activity:  Not on file

## 2022-12-18 ENCOUNTER — Telehealth: Payer: Self-pay | Admitting: Orthopedic Surgery

## 2022-12-18 NOTE — Telephone Encounter (Signed)
Mailed reminder letter to patient today 

## 2022-12-30 ENCOUNTER — Other Ambulatory Visit: Payer: Self-pay | Admitting: Family Medicine

## 2023-01-08 ENCOUNTER — Ambulatory Visit (INDEPENDENT_AMBULATORY_CARE_PROVIDER_SITE_OTHER): Payer: No Typology Code available for payment source | Admitting: Orthopedic Surgery

## 2023-01-08 ENCOUNTER — Other Ambulatory Visit: Payer: Self-pay

## 2023-01-08 ENCOUNTER — Encounter: Payer: Self-pay | Admitting: Orthopedic Surgery

## 2023-01-08 DIAGNOSIS — S52134A Nondisplaced fracture of neck of right radius, initial encounter for closed fracture: Secondary | ICD-10-CM | POA: Diagnosis not present

## 2023-01-08 NOTE — Progress Notes (Unsigned)
   Post-Op Visit Note   Patient: Kristy Trujillo           Date of Birth: 09/19/1950           MRN: [id number] Visit Date: 01/08/2023 PCP: Ardith Dark, MD   Assessment & Plan:  Chief Complaint:  Chief Complaint  Patient presents with   Right Elbow - Fracture, Follow-up    DOI: 11/19/22   Visit Diagnoses:  1. Closed nondisplaced fracture of neck of right radius, initial encounter     Plan: Kristy Trujillo is a patient with right radial head fracture sustained 11/19/2022.  Overall she is doing well.  Essentially has full range of motion of the elbow including pronation supination with no mechanical symptoms.  Plan at this time is for activity as tolerated and she will follow-up with [place] as needed  Follow-Up Instructions: No follow-ups on file.   Orders:  No orders of the defined types were placed in this encounter.  No orders of the defined types were placed in this encounter.   Imaging: No results found.  PMFS History: Patient Active Problem List   Diagnosis Date Noted   Unsteady gait 09/22/2021   Dyslipidemia associated with type 2 diabetes mellitus (HCC) 07/01/2021   CKD (chronic kidney disease) stage 3, GFR 30-59 ml/min (HCC) 02/10/2021   Diabetes mellitus without complication (HCC) 02/10/2021   Hypertension associated with diabetes (HCC) 12/09/2020   Tinnitus of both ears 12/09/2020   Leg edema 11/23/2020   OAB (overactive bladder) 08/12/2020   Lipoma 03/11/2018   Depression, major, in remission (HCC) 03/11/2018   Anxiety 03/11/2018   Past Medical History:  Diagnosis Date   Gallstones    Shingles     Family History  Problem Relation Age of Onset   Diabetes Neg Hx    High Cholesterol Neg Hx    Hypertension Neg Hx    Colon cancer Neg Hx    Breast cancer Neg Hx     Past Surgical History:  Procedure Laterality Date   CHOLECYSTECTOMY     Social History   Occupational History   Occupation: Retired     Comment: Holiday representative   Tobacco Use   Smoking status:  Never   Smokeless tobacco: Never  Substance and Sexual Activity   Alcohol use: No   Drug use: No   Sexual activity: Not on file

## 2023-01-10 DIAGNOSIS — N3946 Mixed incontinence: Secondary | ICD-10-CM | POA: Diagnosis not present

## 2023-01-10 DIAGNOSIS — N3020 Other chronic cystitis without hematuria: Secondary | ICD-10-CM | POA: Diagnosis not present

## 2023-01-12 ENCOUNTER — Telehealth: Payer: Self-pay | Admitting: *Deleted

## 2023-01-12 ENCOUNTER — Other Ambulatory Visit: Payer: Self-pay | Admitting: Family Medicine

## 2023-01-12 NOTE — Telephone Encounter (Signed)
Patient called for an update. States she had asked pharmacy for this refill two weeks ago and they informed her that we hadn't responded to their refill request. I informed pt that the refill request came this morning. Patient is requesting this be filled ASAP since she is out of medication.

## 2023-01-12 NOTE — Telephone Encounter (Signed)
Approved today by [facility] NCPDP 2017 Your request has been approved Authorization Expiration Date: 01/12/2024 Left voice message to patient Rx approved, call pharmacy for refills

## 2023-01-12 NOTE — Telephone Encounter (Signed)
(  Key: [id number]) - [id number] hydrOXYzine HCl 25MG  tablets status: PA Response - ApprovedCreated: November 15th, 2024 [id number] Sent: November 15th, 202

## 2023-01-12 NOTE — Telephone Encounter (Signed)
Pt has been informed of refill and verbalized understanding.

## 2023-01-22 ENCOUNTER — Other Ambulatory Visit: Payer: Self-pay | Admitting: Family Medicine

## 2023-01-27 ENCOUNTER — Other Ambulatory Visit: Payer: Self-pay | Admitting: Family Medicine

## 2023-01-29 ENCOUNTER — Telehealth: Payer: Self-pay | Admitting: Family Medicine

## 2023-01-29 NOTE — Telephone Encounter (Signed)
Clinical Call  Patient Name First: Kristy Last: Trujillo Gender: Female DOB: 1950/05/11 Age: 73 Y 5 M 29 D Return Phone Number: [phone] (Primary) Address: City/ State/ Zip: [place] [address] Client [facility] - Human resources officer [facility] [employer] Provider Jacquiline Doe- MD Contact Type Call Who Is Calling Patient / Member / Family / Caregiver Call Type Triage / Clinical Relationship To Patient Self Return Phone Number [phone] (Primary) Chief Complaint Anxiety and Panic Attack Reason for Call Symptomatic / Request for Health Information Initial Comment Caller states she ran out of her BP medication. She has been taking this medication for years. The pharmacy does hot have a refill for her and she is about to go out town. NO symptoms Additional Comment CBWN she would like to get rx she states she would like to speak with a nurse. She states now she is experiencing anxiety. Translation No Nurse Assessment Nurse: Dorinda Hill, RN, Asher Muir Date/Time (Eastern Time): 01/27/2023 6:18:55 PM Confirm and document reason for call. If symptomatic, describe symptoms. ---Caller states she ran out of her BP medication and the pharmacy would not fill the medication. Pharmacy [phone]. Does the patient have any new or worsening symptoms? ---No Please document clinical information provided and list any resource used. ---pharmacy will give her 5 doses. Disp. Time Lamount Cohen Time) Disposition Final User 01/27/2023 6:05:46 PM Send To Call Back Waiting For Nurse Denice Bors 01/27/2023 6:16:05 PM Send To Clinical Follow Up Jill Side, RNSantina Evans 01/27/2023 6:16:29 PM Send To Clinical Follow Up Lonzo Cloud, RN, Misty Stanley 01/27/2023 6:33:23 PM Clinical Call Yes Dorinda Hill, RN, Asher Muir Final Disposition 01/27/2023 6:33:23 PM Clinical Call Yes Dorinda Hill, RN, Asher Muir

## 2023-01-29 NOTE — Telephone Encounter (Signed)
Rx send to pharmacy  

## 2023-02-06 DIAGNOSIS — R350 Frequency of micturition: Secondary | ICD-10-CM | POA: Diagnosis not present

## 2023-02-06 DIAGNOSIS — N3946 Mixed incontinence: Secondary | ICD-10-CM | POA: Diagnosis not present

## 2023-02-20 ENCOUNTER — Other Ambulatory Visit: Payer: Self-pay | Admitting: Family Medicine

## 2023-02-26 ENCOUNTER — Other Ambulatory Visit: Payer: Self-pay | Admitting: Family Medicine

## 2023-03-01 ENCOUNTER — Other Ambulatory Visit: Payer: Self-pay | Admitting: *Deleted

## 2023-03-01 ENCOUNTER — Telehealth: Payer: Self-pay | Admitting: Family Medicine

## 2023-03-01 MED ORDER — HYDROCHLOROTHIAZIDE 25 MG PO TABS: 25.0000 mg | ORAL_TABLET | Freq: Every day | ORAL | 0 refills | Status: DC

## 2023-03-01 NOTE — Telephone Encounter (Signed)
 I spoke with patient.  Patient states medication name is hydrochlorothiazide .   I have informed patient that script was sent in on 12/31/  Patient states she spoke with pharmacy yesterday . Stated pharmacy did not have script. Would like office to send script in again.

## 2023-03-01 NOTE — Telephone Encounter (Signed)
 Copied from CRM [id number]. Topic: Clinical - Medication Question >> Feb 27, 2023  3:57 PM Mercedes MATSU wrote: Reason for CRM: Patient called in stating that she is completely put of her blood pressure medication. She has an appt coming up 1/7 with Dr. Kennyth and is asking for medication to hold her over until he renews her prescriptions at that appointment. Patient is requesting this refill asap.

## 2023-03-01 NOTE — Telephone Encounter (Signed)
Rx resend to [facility]

## 2023-03-06 ENCOUNTER — Encounter: Payer: Self-pay | Admitting: Family Medicine

## 2023-03-06 ENCOUNTER — Ambulatory Visit (INDEPENDENT_AMBULATORY_CARE_PROVIDER_SITE_OTHER): Payer: No Typology Code available for payment source | Admitting: Family Medicine

## 2023-03-06 VITALS — BP 173/81 | HR 72 | Temp 97.3°F | Ht 62.0 in | Wt 151.8 lb

## 2023-03-06 DIAGNOSIS — N3281 Overactive bladder: Secondary | ICD-10-CM | POA: Diagnosis not present

## 2023-03-06 DIAGNOSIS — F325 Major depressive disorder, single episode, in full remission: Secondary | ICD-10-CM

## 2023-03-06 DIAGNOSIS — Z0001 Encounter for general adult medical examination with abnormal findings: Secondary | ICD-10-CM | POA: Diagnosis not present

## 2023-03-06 DIAGNOSIS — N183 Chronic kidney disease, stage 3 unspecified: Secondary | ICD-10-CM | POA: Diagnosis not present

## 2023-03-06 DIAGNOSIS — F419 Anxiety disorder, unspecified: Secondary | ICD-10-CM

## 2023-03-06 DIAGNOSIS — E1169 Type 2 diabetes mellitus with other specified complication: Secondary | ICD-10-CM

## 2023-03-06 DIAGNOSIS — E785 Hyperlipidemia, unspecified: Secondary | ICD-10-CM | POA: Diagnosis not present

## 2023-03-06 DIAGNOSIS — E119 Type 2 diabetes mellitus without complications: Secondary | ICD-10-CM | POA: Diagnosis not present

## 2023-03-06 DIAGNOSIS — E1159 Type 2 diabetes mellitus with other circulatory complications: Secondary | ICD-10-CM | POA: Diagnosis not present

## 2023-03-06 DIAGNOSIS — H269 Unspecified cataract: Secondary | ICD-10-CM

## 2023-03-06 DIAGNOSIS — I152 Hypertension secondary to endocrine disorders: Secondary | ICD-10-CM

## 2023-03-06 LAB — COMPREHENSIVE METABOLIC PANEL
ALT: 25 U/L (ref 0–35)
AST: 28 U/L (ref 0–37)
Albumin: 4 g/dL (ref 3.5–5.2)
Alkaline Phosphatase: 135 U/L — ABNORMAL HIGH (ref 39–117)
BUN: 50 mg/dL — ABNORMAL HIGH (ref 6–23)
CO2: 25 meq/L (ref 19–32)
Calcium: 8.7 mg/dL (ref 8.4–10.5)
Chloride: 106 meq/L (ref 96–112)
Creatinine, Ser: 1.9 mg/dL — ABNORMAL HIGH (ref 0.40–1.20)
GFR: 25.85 mL/min — ABNORMAL LOW (ref >60.00)
Glucose, Bld: 125 mg/dL — ABNORMAL HIGH (ref 70–99)
Potassium: 3.6 meq/L (ref 3.5–5.1)
Sodium: 140 meq/L (ref 135–145)
Total Bilirubin: 0.4 mg/dL (ref 0.2–1.2)
Total Protein: 6.9 g/dL (ref 6.0–8.3)

## 2023-03-06 LAB — CBC
HCT: 29.9 % — ABNORMAL LOW (ref 36.0–46.0)
Hemoglobin: 9.8 g/dL — ABNORMAL LOW (ref 12.0–15.0)
MCHC: 32.7 g/dL (ref 30.0–36.0)
MCV: 86.1 fL (ref 78.0–100.0)
Platelets: 279 10*3/uL (ref 150.0–400.0)
RBC: 3.48 Mil/uL — ABNORMAL LOW (ref 3.87–5.11)
RDW: 13.2 % (ref 11.5–15.5)
WBC: 5.6 10*3/uL (ref 4.0–10.5)

## 2023-03-06 LAB — LIPID PANEL
Cholesterol: 147 mg/dL (ref 0–200)
HDL: 55.9 mg/dL (ref >39.00)
LDL Cholesterol: 71 mg/dL (ref 0–99)
NonHDL: 91
Total CHOL/HDL Ratio: 3
Triglycerides: 100 mg/dL (ref 0.0–149.0)
VLDL: 20 mg/dL (ref 0.0–40.0)

## 2023-03-06 LAB — HEMOGLOBIN A1C: Hgb A1c MFr Bld: 6.3 % (ref 4.6–6.5)

## 2023-03-06 LAB — TSH: TSH: 1.29 u[IU]/mL (ref 0.35–5.50)

## 2023-03-06 MED ORDER — ATORVASTATIN CALCIUM 40 MG PO TABS: 40.0000 mg | ORAL_TABLET | Freq: Every day | ORAL | 3 refills | Status: DC

## 2023-03-06 MED ORDER — HYDROXYZINE HCL 25 MG PO TABS: 25.0000 mg | ORAL_TABLET | Freq: Three times a day (TID) | ORAL | 9 refills | Status: DC

## 2023-03-06 MED ORDER — HYDROCHLOROTHIAZIDE 25 MG PO TABS: 25.0000 mg | ORAL_TABLET | Freq: Every day | ORAL | 3 refills | Status: DC

## 2023-03-06 MED ORDER — FLUOXETINE HCL 40 MG PO CAPS: 40.0000 mg | ORAL_CAPSULE | Freq: Every day | ORAL | 3 refills | Status: DC

## 2023-03-06 MED ORDER — GABAPENTIN 300 MG PO CAPS: 300.0000 mg | ORAL_CAPSULE | Freq: Three times a day (TID) | ORAL | 3 refills | Status: DC

## 2023-03-06 MED ORDER — MYRBETRIQ 25 MG PO TB24: 25.0000 mg | ORAL_TABLET | Freq: Every day | ORAL | 3 refills | Status: DC

## 2023-03-06 MED ORDER — CLONAZEPAM 1 MG PO TABS: 1.0000 mg | ORAL_TABLET | Freq: Two times a day (BID) | ORAL | 1 refills | Status: DC | PRN

## 2023-03-06 NOTE — Assessment & Plan Note (Signed)
Check CMET. 

## 2023-03-06 NOTE — Assessment & Plan Note (Signed)
 Check A1c.  Last A1c well-controlled at 6.7 via diet.

## 2023-03-06 NOTE — Assessment & Plan Note (Signed)
Check lipids.  Continue Lipitor 40 mg daily. °

## 2023-03-06 NOTE — Progress Notes (Signed)
 Chief Complaint:  Kristy Trujillo is a 73 y.o. female who presents today for her annual comprehensive physical exam.    Assessment/Plan:  Chronic Problems Addressed Today: Cataract Was diagnosed with this by previous ophthalmology however insurance would no longer pay for her to see her previous ophthalmologist.  Will place referral to reestablish with a new clinic.  Anxiety On Prozac  40 mg daily and clonazepam  1 mg daily as needed.  Doing well with this combination.  Will refill today.  She is tolerating well without any significant side effects.  No SI or HI.  Depression, major, in remission (HCC) Stable on Prozac  40 mg daily.  Diabetes mellitus without complication (HCC) Check A1c.  Last A1c well-controlled at 6.7 via diet.   Dyslipidemia associated with type 2 diabetes mellitus (HCC) Check lipids.  Continue Lipitor 40 mg daily.  OAB (overactive bladder) Continue manage per urogynecology.  CKD (chronic kidney disease) stage 3, GFR 30-59 ml/min (HCC) Check CMET   Hypertension associated with diabetes (HCC) Initially elevated to 183/87. Improve to 173/81 on recheck.  She was at goal per [facility] last time that she was here.  We will continue HCTZ 25 mg daily.  She will continue to monitor at home and let us  know if persistently elevated over the next few weeks.  Check labs today.  Preventative Healthcare: Check labs.  Vaccines declined.  Declined colon cancer screening.  Patient Counseling(The following topics were reviewed and/or handout was given):  -Nutrition: Stressed importance of moderation in sodium/caffeine intake, saturated fat and cholesterol, caloric balance, sufficient intake of fresh fruits, vegetables, and fiber.  -Stressed the importance of regular exercise.   -Substance Abuse: Discussed cessation/primary prevention of tobacco, alcohol, or other drug use; driving or other dangerous activities under the influence; availability of treatment for abuse.   -Injury  prevention: Discussed safety belts, safety helmets, smoke detector, smoking near bedding or upholstery.   -Sexuality: Discussed sexually transmitted diseases, partner selection, use of condoms, avoidance of unintended pregnancy and contraceptive alternatives.   -Dental health: Discussed importance of regular tooth brushing, flossing, and dental visits.  -Health maintenance and immunizations reviewed. Please refer to Health maintenance section.  Return to care in 1 year for next preventative visit.     Subjective:  HPI:  She has no acute complaints today. See Assessment / plan for status of chronic conditions.  She was last seen here a few months ago.  At that time she was having arm pain after recent fall.  We check plain films which was suspicious for fracture.  Referred her to orthopedics.  She has recovered well.  She has also followed with urogynecology since her last visit and they are managing her overactive bladder.  She is doing well with current medication regimen.  Lifestyle Diet: Balanced. Plenty of fruits and vegetables.  Exercise: Getting plenty of steps in.      03/06/2023    2:01 PM  Depression screen PHQ 2/9  Decreased Interest 0  Down, Depressed, Hopeless 0  PHQ - 2 Score 0  Altered sleeping 0  Tired, decreased energy 0  Change in appetite 0  Feeling bad or failure about yourself  0  Trouble concentrating 0  Moving slowly or fidgety/restless 0  Suicidal thoughts 0  PHQ-9 Score 0  Difficult doing work/chores Not difficult at all    Health Maintenance Due  Topic Date Due   FOOT EXAM  Never done   OPHTHALMOLOGY EXAM  Never done   DEXA SCAN  Never  done   MAMMOGRAM  10/15/2021   Diabetic kidney evaluation - eGFR measurement  12/30/2022     ROS: Per HPI, otherwise a complete review of systems was negative.   PMH:  The following were reviewed and entered/updated in epic: Past Medical History:  Diagnosis Date   Gallstones    Shingles    Patient Active  Problem List   Diagnosis Date Noted   Cataract 03/06/2023   Unsteady gait 09/22/2021   Dyslipidemia associated with type 2 diabetes mellitus (HCC) 07/01/2021   CKD (chronic kidney disease) stage 3, GFR 30-59 ml/min (HCC) 02/10/2021   Diabetes mellitus without complication (HCC) 02/10/2021   Hypertension associated with diabetes (HCC) 12/09/2020   Tinnitus of both ears 12/09/2020   Leg edema 11/23/2020   OAB (overactive bladder) 08/12/2020   Lipoma 03/11/2018   Depression, major, in remission (HCC) 03/11/2018   Anxiety 03/11/2018   Past Surgical History:  Procedure Laterality Date   CHOLECYSTECTOMY      Family History  Problem Relation Age of Onset   Diabetes Neg Hx    High Cholesterol Neg Hx    Hypertension Neg Hx    Colon cancer Neg Hx    Breast cancer Neg Hx     Medications- reviewed and updated Current Outpatient Medications  Medication Sig Dispense Refill   ciprofloxacin  (CIPRO ) 250 MG tablet Take 250 mg by mouth 2 (two) times daily.     Multiple Vitamins-Minerals (MULTIVITAMINS THER. W/MINERALS) TABS Take 1 tablet by mouth daily.     mupirocin  ointment (BACTROBAN ) 2 % Apply 1 Application topically 2 (two) times daily. 22 g 0   nitrofurantoin , macrocrystal-monohydrate, (MACROBID ) 100 MG capsule Take 1 capsule (100 mg total) by mouth 2 (two) times daily. 14 capsule 0   OVER THE COUNTER MEDICATION Take 2 tablets by mouth every morning. OTC allergy and sinus tablets     phenazopyridine  (PYRIDIUM ) 100 MG tablet Take 1 tablet (100 mg total) by mouth 3 (three) times daily as needed for pain. 6 tablet 0   triamcinolone  cream (KENALOG ) 0.5 % Apply 1 Application topically 3 (three) times daily. 30 g 0   atorvastatin  (LIPITOR) 40 MG tablet Take 1 tablet (40 mg total) by mouth daily. 90 tablet 3   clonazePAM  (KLONOPIN ) 1 MG tablet Take 1 tablet (1 mg total) by mouth 2 (two) times daily as needed. for anxiety 180 tablet 1   FLUoxetine  (PROZAC ) 40 MG capsule Take 1 capsule (40 mg  total) by mouth daily. 90 capsule 3   gabapentin  (NEURONTIN ) 300 MG capsule Take 1 capsule (300 mg total) by mouth 3 (three) times daily. 270 capsule 3   hydrochlorothiazide  (HYDRODIURIL ) 25 MG tablet Take 1 tablet (25 mg total) by mouth daily. 90 tablet 3   hydrOXYzine  (ATARAX ) 25 MG tablet Take 1 tablet (25 mg total) by mouth 3 (three) times daily. 90 tablet 9   MYRBETRIQ  25 MG TB24 tablet Take 1 tablet (25 mg total) by mouth daily. 90 tablet 3   No current facility-administered medications for this visit.    Allergies-reviewed and updated Allergies  Allergen Reactions   Alcohol Detox [Nutritional Supplements]     Flushing (cough medicine)   Codeine Swelling   Erythromycin Swelling    Social History   Socioeconomic History   Marital status: Divorced    Spouse name: Not on file   Number of children: 2   Years of education: Not on file   Highest education level: Not on file  Occupational History  Occupation: Retired     Comment: Holiday Representative   Tobacco Use   Smoking status: Never   Smokeless tobacco: Never  Substance and Sexual Activity   Alcohol use: No   Drug use: No   Sexual activity: Not on file  Other Topics Concern   Not on file  Social History Narrative   Left Handed    Lives in a one story home   Social Drivers of Health   Financial Resource Strain: Low Risk  (10/16/2022)   Overall Financial Resource Strain (CARDIA)    Difficulty of Paying Living Expenses: Not hard at all  Food Insecurity: No Food Insecurity (10/16/2022)   Hunger Vital Sign    Worried About Running Out of Food in the Last Year: Never true    Ran Out of Food in the Last Year: Never true  Transportation Needs: No Transportation Needs (10/16/2022)   PRAPARE - Administrator, Civil Service (Medical): No    Lack of Transportation (Non-Medical): No  Physical Activity: Inactive (10/16/2022)   Exercise Vital Sign    Days of Exercise per Week: 0 days    Minutes of Exercise per Session:  0 min  Stress: No Stress Concern Present (10/16/2022)   [employer] of Occupational Health - Occupational Stress Questionnaire    Feeling of Stress : Not at all  Social Connections: Moderately Integrated (10/16/2022)   Social Connection and Isolation Panel [NHANES]    Frequency of Communication with Friends and Family: More than three times a week    Frequency of Social Gatherings with Friends and Family: More than three times a week    Attends Religious Services: More than 4 times per year    Active Member of [employer] or Organizations: Yes    Attends Banker Meetings: 1 to 4 times per year    Marital Status: Divorced        Objective:  Physical Exam: BP (!) 173/81   Pulse 72   Temp (!) 97.3 F (36.3 C) (Temporal)   Ht 5' 2 (1.575 m)   Wt 151 lb 12.8 oz (68.9 kg)   SpO2 100%   BMI 27.76 kg/m   Body mass index is 27.76 kg/m. Wt Readings from Last 3 Encounters:  03/06/23 151 lb 12.8 oz (68.9 kg)  11/21/22 153 lb 12.8 oz (69.8 kg)  10/16/22 159 lb (72.1 kg)   Gen: NAD, resting comfortably HEENT: TMs normal bilaterally. OP clear. No thyromegaly noted.  CV: RRR with no murmurs appreciated Pulm: NWOB, CTAB with no crackles, wheezes, or rhonchi GI: Normal bowel sounds present. Soft, Nontender, Nondistended. MSK: no edema, cyanosis, or clubbing noted Skin: warm, dry Neuro: CN2-12 grossly intact. Strength 5/5 in upper and lower extremities. Reflexes symmetric and intact bilaterally.  Psych: Normal affect and thought content     Genelda Roark M. Kennyth, MD 03/06/2023 2:34 PM

## 2023-03-06 NOTE — Assessment & Plan Note (Signed)
 Was diagnosed with this by previous ophthalmology however insurance would no longer pay for her to see her previous ophthalmologist.  Will place referral to reestablish with a new clinic.

## 2023-03-06 NOTE — Assessment & Plan Note (Signed)
 Initially elevated to 183/87. Improve to 173/81 on recheck.  She was at goal per [facility] last time that she was here.  We will continue HCTZ 25 mg daily.  She will continue to monitor at home and let us  know if persistently elevated over the next few weeks.  Check labs today.

## 2023-03-06 NOTE — Assessment & Plan Note (Signed)
 Continue manage per urogynecology.

## 2023-03-06 NOTE — Assessment & Plan Note (Signed)
 On Prozac 40 mg daily and clonazepam 1 mg daily as needed.  Doing well with this combination.  Will refill today.  She is tolerating well without any significant side effects.  No SI or HI.

## 2023-03-06 NOTE — Assessment & Plan Note (Signed)
Stable on Prozac 40 mg daily. 

## 2023-03-06 NOTE — Patient Instructions (Signed)
 It was very nice to see you today!  We will check blood work.  I will refill your medications.  I will refer you to see the eye doctor.  Please continue to work on diet and exercise.  Please keep an eye on your blood pressure and let us  know if it is persistently 140/90 or higher.  Return in about 1 year (around 03/05/2024) for Annual Physical.   Take care, Dr Kennyth  PLEASE NOTE:  If you had any lab tests, please let us  know if you have not heard back within a few days. You may see your results on mychart before we have a chance to review them but we will give you a call once they are reviewed by us .   If we ordered any referrals today, please let us  know if you have not heard from their office within the next week.   If you had any urgent prescriptions sent in today, please check with the pharmacy within an hour of our visit to make sure the prescription was transmitted appropriately.   Please try these tips to maintain a healthy lifestyle:  Eat at least 3 REAL meals and 1-2 snacks per day.  Aim for no more than 5 hours between eating.  If you eat breakfast, please do so within one hour of getting up.   Each meal should contain half fruits/vegetables, one quarter protein, and one quarter carbs (no bigger than a computer mouse)  Cut down on sweet beverages. This includes juice, soda, and sweet tea.   Drink at least 1 glass of water with each meal and aim for at least 8 glasses per day  Exercise at least 150 minutes every week.    Preventive Care 41 Years and Older, Female Preventive care refers to lifestyle choices and visits with your health care provider that can promote health and wellness. Preventive care visits are also called wellness exams. What can I expect for my preventive care visit? Counseling Your health care provider may ask you questions about your: Medical history, including: Past medical problems. Family medical history. Pregnancy and menstrual history. History  of falls. Current health, including: Memory and ability to understand (cognition). Emotional well-being. Home life and relationship well-being. Sexual activity and sexual health. Lifestyle, including: Alcohol, nicotine or tobacco, and drug use. Access to firearms. Diet, exercise, and sleep habits. Work and work astronomer. Sunscreen use. Safety issues such as seatbelt and bike helmet use. Physical exam Your health care provider will check your: Height and weight. These may be used to calculate your BMI (body mass index). BMI is a measurement that tells if you are at a healthy weight. Waist circumference. This measures the distance around your waistline. This measurement also tells if you are at a healthy weight and may help predict your risk of certain diseases, such as type 2 diabetes and high blood pressure. Heart rate and blood pressure. Body temperature. Skin for abnormal spots. What immunizations do I need?  Vaccines are usually given at various ages, according to a schedule. Your health care provider will recommend vaccines for you based on your age, medical history, and lifestyle or other factors, such as travel or where you work. What tests do I need? Screening Your health care provider may recommend screening tests for certain conditions. This may include: Lipid and cholesterol levels. Hepatitis C test. Hepatitis B test. HIV (human immunodeficiency virus) test. STI (sexually transmitted infection) testing, if you are at risk. Lung cancer screening. Colorectal cancer screening. Diabetes  screening. This is done by checking your blood sugar (glucose) after you have not eaten for a while (fasting). Mammogram. Talk with your health care provider about how often you should have regular mammograms. BRCA-related cancer screening. This may be done if you have a family history of breast, ovarian, tubal, or peritoneal cancers. Bone density scan. This is done to screen for  osteoporosis. Talk with your health care provider about your test results, treatment options, and if necessary, the need for more tests. Follow these instructions at home: Eating and drinking  Eat a diet that includes fresh fruits and vegetables, whole grains, lean protein, and low-fat dairy products. Limit your intake of foods with high amounts of sugar, saturated fats, and salt. Take vitamin and mineral supplements as recommended by your health care provider. Do not drink alcohol if your health care provider tells you not to drink. If you drink alcohol: Limit how much you have to 0-1 drink a day. Know how much alcohol is in your drink. In the [place] one drink equals one 12 oz bottle of beer (355 mL), one 5 oz glass of wine (148 mL), or one 1 oz glass of hard liquor (44 mL). Lifestyle Brush your teeth every morning and night with fluoride toothpaste. Floss one time each day. Exercise for at least 30 minutes 5 or more days each week. Do not use any products that contain nicotine or tobacco. These products include cigarettes, chewing tobacco, and vaping devices, such as e-cigarettes. If you need help quitting, ask your health care provider. Do not use drugs. If you are sexually active, practice safe sex. Use a condom or other form of protection in order to prevent STIs. Take aspirin only as told by your health care provider. Make sure that you understand how much to take and what form to take. Work with your health care provider to find out whether it is safe and beneficial for you to take aspirin daily. Ask your health care provider if you need to take a cholesterol-lowering medicine (statin). Find healthy ways to manage stress, such as: Meditation, yoga, or listening to music. Journaling. Talking to a trusted person. Spending time with friends and family. Minimize exposure to UV radiation to reduce your risk of skin cancer. Safety Always wear your seat belt while driving or riding in a  vehicle. Do not drive: If you have been drinking alcohol. Do not ride with someone who has been drinking. When you are tired or distracted. While texting. If you have been using any mind-altering substances or drugs. Wear a helmet and other protective equipment during sports activities. If you have firearms in your house, make sure you follow all gun safety procedures. What's next? Visit your health care provider once a year for an annual wellness visit. Ask your health care provider how often you should have your eyes and teeth checked. Stay up to date on all vaccines. This information is not intended to replace advice given to you by your health care provider. Make sure you discuss any questions you have with your health care provider. Document Revised: 08/11/2020 Document Reviewed: 08/11/2020 Elsevier Patient Education  2024 [employer].

## 2023-03-07 LAB — MICROALBUMIN / CREATININE URINE RATIO
Creatinine,U: 74 mg/dL
Microalb Creat Ratio: 156.7 mg/g — ABNORMAL HIGH (ref 0.0–30.0)
Microalb, Ur: 115.9 mg/dL — ABNORMAL HIGH (ref 0.0–1.9)

## 2023-03-08 ENCOUNTER — Other Ambulatory Visit: Payer: Self-pay | Admitting: *Deleted

## 2023-03-08 DIAGNOSIS — E611 Iron deficiency: Secondary | ICD-10-CM

## 2023-03-08 DIAGNOSIS — O1210 Gestational proteinuria, unspecified trimester: Secondary | ICD-10-CM

## 2023-03-08 NOTE — Progress Notes (Signed)
 Her kidney function has decreased somewhat since our last visit.  She is also having protein in her urine.  Recommend that we refer her to see a nephrologist for ongoing evaluation and management.  Please place order.  She is more anemic than last year.  Please have her come back to recheck CBC, iron panel, and B12.  Her A1c is at goal at 6.3.  We can recheck this again in 6 months.  Her cholesterol levels are at goal.  The rest of her labs are all stable.  We can recheck everything again in a year or so.

## 2023-03-09 ENCOUNTER — Other Ambulatory Visit (INDEPENDENT_AMBULATORY_CARE_PROVIDER_SITE_OTHER): Payer: No Typology Code available for payment source

## 2023-03-09 DIAGNOSIS — E611 Iron deficiency: Secondary | ICD-10-CM

## 2023-03-09 LAB — CBC
HCT: 28.6 % — ABNORMAL LOW (ref 36.0–46.0)
Hemoglobin: 9.4 g/dL — ABNORMAL LOW (ref 12.0–15.0)
MCHC: 32.7 g/dL (ref 30.0–36.0)
MCV: 86 fL (ref 78.0–100.0)
Platelets: 270 10*3/uL (ref 150.0–400.0)
RBC: 3.33 Mil/uL — ABNORMAL LOW (ref 3.87–5.11)
RDW: 13.8 % (ref 11.5–15.5)
WBC: 6.7 10*3/uL (ref 4.0–10.5)

## 2023-03-09 LAB — VITAMIN B12: Vitamin B-12: 697 pg/mL (ref 211–911)

## 2023-03-10 LAB — IRON,TIBC AND FERRITIN PANEL
%SAT: 30 % (ref 16–45)
Ferritin: 152 ng/mL (ref 16–288)
Iron: 65 ug/dL (ref 45–160)
TIBC: 219 ug/dL — ABNORMAL LOW (ref 250–450)

## 2023-03-12 DIAGNOSIS — N3946 Mixed incontinence: Secondary | ICD-10-CM | POA: Diagnosis not present

## 2023-03-12 DIAGNOSIS — R350 Frequency of micturition: Secondary | ICD-10-CM | POA: Diagnosis not present

## 2023-03-12 NOTE — Progress Notes (Signed)
 Her iron and B12 levels are normal however her blood counts are still trending down. Can we have her come back for one more set of labs?  Please place order for CBC with differential, peripheral smear, haptoglobin, and LDH.

## 2023-03-13 ENCOUNTER — Other Ambulatory Visit: Payer: Self-pay | Admitting: *Deleted

## 2023-03-13 DIAGNOSIS — D72818 Other decreased white blood cell count: Secondary | ICD-10-CM

## 2023-03-19 ENCOUNTER — Other Ambulatory Visit (INDEPENDENT_AMBULATORY_CARE_PROVIDER_SITE_OTHER): Payer: No Typology Code available for payment source

## 2023-03-19 DIAGNOSIS — D72818 Other decreased white blood cell count: Secondary | ICD-10-CM

## 2023-03-19 NOTE — Progress Notes (Unsigned)
Kristy Trujillo

## 2023-03-20 LAB — CBC WITH DIFFERENTIAL/PLATELET
Absolute Lymphocytes: 1972 {cells}/uL (ref 850–3900)
Absolute Monocytes: 510 {cells}/uL (ref 200–950)
Basophils Absolute: 69 {cells}/uL (ref 0–200)
Basophils Relative: 1.1 %
Eosinophils Absolute: 233 {cells}/uL (ref 15–500)
Eosinophils Relative: 3.7 %
HCT: 31.7 % — ABNORMAL LOW (ref 35.0–45.0)
Hemoglobin: 10.2 g/dL — ABNORMAL LOW (ref 11.7–15.5)
MCH: 27.9 pg (ref 27.0–33.0)
MCHC: 32.2 g/dL (ref 32.0–36.0)
MCV: 86.8 fL (ref 80.0–100.0)
MPV: 10.1 fL (ref 7.5–12.5)
Monocytes Relative: 8.1 %
Neutro Abs: 3515 {cells}/uL (ref 1500–7800)
Neutrophils Relative %: 55.8 %
Platelets: 264 10*3/uL (ref 140–400)
RBC: 3.65 10*6/uL — ABNORMAL LOW (ref 3.80–5.10)
RDW: 12.2 % (ref 11.0–15.0)
Total Lymphocyte: 31.3 %
WBC: 6.3 10*3/uL (ref 3.8–10.8)

## 2023-03-20 LAB — HAPTOGLOBIN: Haptoglobin: 121 mg/dL (ref 43–212)

## 2023-03-20 LAB — LACTATE DEHYDROGENASE: LDH: 194 U/L (ref 120–250)

## 2023-03-20 LAB — PATHOLOGIST SMEAR REVIEW

## 2023-03-22 NOTE — Progress Notes (Signed)
Her blood counts are slightly improving.  Can we check on her peripheral smear? It looks like the lab cancelled it.

## 2023-03-26 ENCOUNTER — Telehealth: Payer: Self-pay | Admitting: *Deleted

## 2023-03-26 ENCOUNTER — Ambulatory Visit: Payer: Self-pay | Admitting: Family Medicine

## 2023-03-26 NOTE — Telephone Encounter (Signed)
Spoke with patient, stated she fainted due to low BP, refused to go to ED  Felling some better now, advise to go to ED if symptoms not better  Schedule an office visit with PCP

## 2023-03-26 NOTE — Telephone Encounter (Signed)
Copied from CRM [id number]. Topic: Clinical - Red Word Triage >> Mar 26, 2023 10:41 AM Sonny Dandy B wrote: [facility] that prompted transfer to Nurse Triage: pt fainted, due to low blood pressure.   Patient reporting that she fainted while grocery shopping. Was evaluated by EMS but did not go to the hospital. Patient ended the call due to her PCP office calling her during the call.

## 2023-03-26 NOTE — Telephone Encounter (Signed)
See previews note    Copied from CRM [id number]. Topic: Clinical - Lab/Test Results >> Mar 26, 2023  9:15 AM [name] wrote: Reason for CRM: Patient is requesting to speak with a nurse in regard to her recent lab results. Also, patient states she was unable to complete the smear test & would like to discuss this as well.

## 2023-03-28 ENCOUNTER — Encounter: Payer: Self-pay | Admitting: Family Medicine

## 2023-03-28 ENCOUNTER — Ambulatory Visit (INDEPENDENT_AMBULATORY_CARE_PROVIDER_SITE_OTHER): Payer: No Typology Code available for payment source | Admitting: Family Medicine

## 2023-03-28 VITALS — BP 157/82 | HR 78 | Temp 97.2°F | Ht 62.0 in | Wt 149.0 lb

## 2023-03-28 DIAGNOSIS — R55 Syncope and collapse: Secondary | ICD-10-CM | POA: Diagnosis not present

## 2023-03-28 DIAGNOSIS — N184 Chronic kidney disease, stage 4 (severe): Secondary | ICD-10-CM

## 2023-03-28 DIAGNOSIS — I152 Hypertension secondary to endocrine disorders: Secondary | ICD-10-CM

## 2023-03-28 DIAGNOSIS — D649 Anemia, unspecified: Secondary | ICD-10-CM

## 2023-03-28 DIAGNOSIS — R21 Rash and other nonspecific skin eruption: Secondary | ICD-10-CM

## 2023-03-28 DIAGNOSIS — E1159 Type 2 diabetes mellitus with other circulatory complications: Secondary | ICD-10-CM | POA: Diagnosis not present

## 2023-03-28 DIAGNOSIS — N3946 Mixed incontinence: Secondary | ICD-10-CM | POA: Diagnosis not present

## 2023-03-28 DIAGNOSIS — R350 Frequency of micturition: Secondary | ICD-10-CM | POA: Diagnosis not present

## 2023-03-28 LAB — CBC WITH DIFFERENTIAL/PLATELET
Absolute Lymphocytes: 1956 {cells}/uL (ref 850–3900)
Absolute Monocytes: 590 {cells}/uL (ref 200–950)
Basophils Absolute: 87 {cells}/uL (ref 0–200)
Basophils Relative: 1.3 %
Eosinophils Absolute: 281 {cells}/uL (ref 15–500)
Eosinophils Relative: 4.2 %
HCT: 33.1 % — ABNORMAL LOW (ref 35.0–45.0)
Hemoglobin: 10.4 g/dL — ABNORMAL LOW (ref 11.7–15.5)
MCH: 27.4 pg (ref 27.0–33.0)
MCHC: 31.4 g/dL — ABNORMAL LOW (ref 32.0–36.0)
MCV: 87.3 fL (ref 80.0–100.0)
MPV: 10.1 fL (ref 7.5–12.5)
Monocytes Relative: 8.8 %
Neutro Abs: 3786 {cells}/uL (ref 1500–7800)
Neutrophils Relative %: 56.5 %
Platelets: 282 10*3/uL (ref 140–400)
RBC: 3.79 10*6/uL — ABNORMAL LOW (ref 3.80–5.10)
RDW: 12.5 % (ref 11.0–15.0)
Total Lymphocyte: 29.2 %
WBC: 6.7 10*3/uL (ref 3.8–10.8)

## 2023-03-28 LAB — COMPREHENSIVE METABOLIC PANEL
ALT: 33 U/L (ref 0–35)
AST: 42 U/L — ABNORMAL HIGH (ref 0–37)
Albumin: 3.9 g/dL (ref 3.5–5.2)
Alkaline Phosphatase: 144 U/L — ABNORMAL HIGH (ref 39–117)
BUN: 48 mg/dL — ABNORMAL HIGH (ref 6–23)
CO2: 26 meq/L (ref 19–32)
Calcium: 8.8 mg/dL (ref 8.4–10.5)
Chloride: 107 meq/L (ref 96–112)
Creatinine, Ser: 1.98 mg/dL — ABNORMAL HIGH (ref 0.40–1.20)
GFR: 24.6 mL/min — ABNORMAL LOW (ref >60.00)
Glucose, Bld: 115 mg/dL — ABNORMAL HIGH (ref 70–99)
Potassium: 4.5 meq/L (ref 3.5–5.1)
Sodium: 141 meq/L (ref 135–145)
Total Bilirubin: 0.3 mg/dL (ref 0.2–1.2)
Total Protein: 6.8 g/dL (ref 6.0–8.3)

## 2023-03-28 MED ORDER — DESONIDE 0.05 % EX CREA: TOPICAL_CREAM | Freq: Two times a day (BID) | CUTANEOUS | 0 refills | Status: DC

## 2023-03-28 NOTE — Progress Notes (Signed)
Kristy Trujillo is a 73 y.o. female who presents today for an office visit.  Assessment/Plan:  New/Acute Problems: Syncope Concern for cardiogenic etiology based on history.  Vasovagal is a possibility however she did not have any prodromal symptoms or obvious triggers. EKG today shows normal sinus rhythm without any arrhythmias.  No acute ischemic changes.. Orthostatic vitals notable for 40 point drop from lying to standing -this could be contributing to her presentation as well however blood pressure only dropped to 134/78 with standing.  We will check labs and refer to cardiology.  Did recommend good hydration.  Also recommended compression stockings.  We discussed reasons to return to care and seek emergent care.  Anemia  Recent hemoglobin was improving.  Her haptoglobin and LDH were normal.  Iron and B12 levels are normal as well.  We will recheck CBC with differential.  Also check peripheral smear.  This may be related to her CKD.  Referral to nephrology is pending.  May need referral to hematology depending on labs.  Rash  Consistent with seborrheic dermatitis versus eczema.  Will start topical desonide.  Encourage daily hydration.  Can consider trial of ketoconazole if no improvement with desonide.  Chronic Problems Addressed Today: CKD (chronic kidney disease) stage 4, GFR 15-29 ml/min (HCC) The referral to nephrology is pending.  Hopefully she will be able to see them soon.  Recheck c-Met today.  Need to avoid nephrotoxic medications.  Discussed importance of good hydration.  Hypertension associated with diabetes (HCC) Initially elevated to 157/82. Orthostatic vitals with a 40 point drop in sitting to standing.  She likely does have some orthostasis which may be contributing to her above syncopal episode.  Would be hesitant to add on any additional antihypertensives at this point given precipitous drop with standing as well as recent syncopal episode.  She can continue HCTZ 25 mg  daily.  Will defer further management to cardiology.  Check labs today.     Subjective:  HPI:  See A/P for status of chronic conditions.  Patient is here today for follow-up for syncopal episode.  This happened a couple of days ago while grocery shopping. States that she was in her normal state of health. She was in the check out line when she suddenly lose consciousness. This happened without warning. She thinks that she was only out of a few moments. The workers at the store caught her while she was falling. No head trauma. EMS was called and she was told that her blood pressure was very low.  She did not have any witnessed jerking or seizure-like activity.  She felt very fatigued afterwards but no confusion or headache.  No chest pain or shortness of breath.  No palpitations.  No recent illnesses.  No fevers or chills.  I did see her for her annual physical few weeks ago.  At that time she was noted to be anemic.  Her hemoglobin was improving on repeat labs.  We had ordered a peripheral smear however this could not be completed.  She has also had irritated rash on her face for the last several weeks.  No treatments tried.  No exposures.  Flaking and itching noted.       Objective:  Physical Exam: BP (!) 157/82   Pulse 78   Temp (!) 97.2 F (36.2 C) (Temporal)   Ht 5\' 2"  (1.575 m)   Wt 149 lb (67.6 kg)   SpO2 100%   BMI 27.25 kg/m   Orthostatic vitals: -  Lying: 176/101, 74 - Sitting: 147/82, 69 - Standing: 134/78, 90 Gen: No acute distress, resting comfortably CV: Regular rate and rhythm with no murmurs appreciated Pulm: Normal work of breathing, clear to auscultation bilaterally with no crackles, wheezes, or rhonchi Neuro: Grossly normal, moves all extremities Psych: Normal affect and thought content  EKG: Sinus rhythm. No acute ischemic changes.   Time Spent: 45 minutes of total time was spent on the date of the encounter performing the following actions: chart review prior  to seeing the patient, obtaining history, performing a medically necessary exam, counseling on the treatment plan, placing orders, and documenting in our EHR.        Katina Degree. [name], MD 03/28/2023 10:36 AM

## 2023-03-28 NOTE — Patient Instructions (Signed)
It was very nice to see you today!  We will send you please see the cardiologist.  Will check blood work today.  Please try the desonide for your rash.  Let us know if not improving.  Return if symptoms worsen or fail to improve.   Take care, Dr [name]  PLEASE NOTE:  If you had any lab tests, please let us know if you have not heard back within a few days. You may see your results on mychart before we have a chance to review them but we will give you a call once they are reviewed by [place].   If we ordered any referrals today, please let us know if you have not heard from their office within the next week.   If you had any urgent prescriptions sent in today, please check with the pharmacy within an hour of our visit to make sure the prescription was transmitted appropriately.   Please try these tips to maintain a healthy lifestyle:  Eat at least 3 REAL meals and 1-2 snacks per day.  Aim for no more than 5 hours between eating.  If you eat breakfast, please do so within one hour of getting up.   Each meal should contain half fruits/vegetables, one quarter protein, and one quarter carbs (no bigger than a computer mouse)  Cut down on sweet beverages. This includes juice, soda, and sweet tea.   Drink at least 1 glass of water with each meal and aim for at least 8 glasses per day  Exercise at least 150 minutes every week.

## 2023-03-28 NOTE — Assessment & Plan Note (Addendum)
Initially elevated to 157/82. Orthostatic vitals with a 40 point drop in sitting to standing.  She likely does have some orthostasis which may be contributing to her above syncopal episode.  Would be hesitant to add on any additional antihypertensives at this point given precipitous drop with standing as well as recent syncopal episode.  She can continue HCTZ 25 mg daily.  Will defer further management to cardiology.  Check labs today.

## 2023-03-28 NOTE — Addendum Note (Signed)
Addended by: Ardith Dark on: 03/28/2023 10:44 AM   Modules accepted: Orders

## 2023-03-28 NOTE — Assessment & Plan Note (Signed)
The referral to nephrology is pending.  Hopefully she will be able to see them soon.  Recheck c-Met today.  Need to avoid nephrotoxic medications.  Discussed importance of good hydration.

## 2023-03-29 ENCOUNTER — Telehealth: Payer: Self-pay | Admitting: Family Medicine

## 2023-03-29 ENCOUNTER — Telehealth: Payer: Self-pay

## 2023-03-29 ENCOUNTER — Other Ambulatory Visit (HOSPITAL_COMMUNITY): Payer: Self-pay

## 2023-03-29 NOTE — Telephone Encounter (Signed)
Pharmacy Patient Advocate Encounter  Received notification from [facility] Medicare that Prior Authorization for Desonide 0.05% cream has been DENIED.  See denial reason below. No denial letter attached in CMM. Will attach denial letter to Media tab once received.   PA #/Case ID/Reference #: [id number]    Please see above snip it for alternative drugs that should be covered under the patients health plan

## 2023-03-29 NOTE — Telephone Encounter (Signed)
Pharmacy Patient Advocate Encounter   Received notification from  [facility] that prior authorization for Desonide 0.05% cream is required/requested.   Insurance verification completed.   The patient is insured through [facility] .   Per test claim: PA required; PA submitted to above mentioned insurance via CoverMyMeds Key/confirmation #/EOC [id number] Status is pending  Hydrocortisone 2.5% cream or Alclometasone 0.05% cream is preferred by the insurance.  If suggested medication is appropriate, Please send in a new RX and discontinue this one. If not, please advise as to why it's not appropriate so that we may request a Prior Authorization. Please note, some preferred medications may still require a PA.  If the suggested medications have not been trialed and there are no contraindications to their use, the PA will not be submitted, as it will not be approved.

## 2023-03-29 NOTE — Telephone Encounter (Signed)
Please advise

## 2023-03-29 NOTE — Telephone Encounter (Signed)
Clinical Call: insurance is not covering desonide, wants alclometasone instead. Please advise  Government social research officer at [facility] - Human resources officer [facility] Provider Jacquiline Doe- MD Contact Type Call Who Is Calling Patient / Member / Family / Caregiver Caller Name Alexianna Nachreiner Caller Phone Number [phone] Patient Name Kristy Trujillo Patient DOB Apr 29, 1950 Call Type Message Only Information Provided Reason for Call Request for General Office Information Initial Comment Caller states doctor called in desonide but insurance is not covering so she would like to see if doctor can call in alcometasone instead. Disp. Time Disposition Final User 03/28/2023 7:10:43 PM General Information Provided Yes Verlon Setting Call Closed By: Verlon Setting Transaction Date/Time: 03/28/2023 7:07:28 PM (ET)

## 2023-03-29 NOTE — Telephone Encounter (Signed)
Ok with me. Please place any necessary orders.

## 2023-03-29 NOTE — Telephone Encounter (Signed)
Copied from CRM [id number]. Topic: Clinical - Lab/Test Results >> Mar 29, 2023 11:26 AM Marica Otter wrote: Reason for CRM: Patient following up on lab results that were done on 1/29, please reach out to patient, thanks.  [place] [phone]  Called pt and advised Dr [name] has not reviewed lab results as of yet but as soon as he reviews them someone in our clinic will call her with results. Pt verbalized understanding. Pt also states cream prescribed is not covered by her insurance and an alternative will need to be sent for her. Advised a note already sent to Dr [name] in regards to that prescription.

## 2023-03-30 ENCOUNTER — Other Ambulatory Visit: Payer: Self-pay

## 2023-03-30 ENCOUNTER — Encounter: Payer: Self-pay | Admitting: Family Medicine

## 2023-03-30 MED ORDER — ALCLOMETASONE DIPROPIONATE 0.05 % EX CREA: TOPICAL_CREAM | Freq: Two times a day (BID) | CUTANEOUS | 0 refills | Status: DC

## 2023-03-30 NOTE — Telephone Encounter (Signed)
 Noted

## 2023-03-30 NOTE — Progress Notes (Signed)
Her blood counts are improving.  We can recheck this again in a few weeks.  Her kidney function is still below goal.  Can we please make sure that she has a appointment to see nephrology soon?  We also need to make sure that she sees cardiology soon as well.  Can we please make sure that this has been scheduled.

## 2023-03-30 NOTE — Telephone Encounter (Signed)
Rx for alcometasone sent to pharmacy per pt request and PCP approval

## 2023-04-03 ENCOUNTER — Telehealth: Payer: Self-pay | Admitting: *Deleted

## 2023-04-03 ENCOUNTER — Other Ambulatory Visit: Payer: Self-pay | Admitting: *Deleted

## 2023-04-03 DIAGNOSIS — D72818 Other decreased white blood cell count: Secondary | ICD-10-CM

## 2023-04-03 NOTE — Telephone Encounter (Signed)
 Copied from CRM [id number]. Topic: General - Other >> Apr 03, 2023 12:30 PM Franky GRADE wrote: Reason for CRM: Patient is calling to inform Kristy Trujillo, that she called [facility] to schedule an appointment; however, they were unable to schedule and told patient they would call her back. Patient would like to know if there is anything the office can do to help.   Called [facility] for appt, stated they scheduler coordination will call patient with appt  Patient notified  [facility]

## 2023-04-03 NOTE — Telephone Encounter (Signed)
Pt would like a call back as soon as you can. She would like the results and what to do next.

## 2023-04-03 NOTE — Telephone Encounter (Signed)
Give patient information for cardiology its [phone] and Nephrology,[facility] number is [phone]  Advise to call for appt today

## 2023-04-03 NOTE — Telephone Encounter (Signed)
 Copied from CRM [id number]. Topic: Clinical - Lab/Test Results >> Apr 02, 2023  3:58 PM Leila C wrote: Reason for CRM: Patient [phone] is unable to get into MyChart and wants to know results and why does she need to see a cardiologist. Informed patient patient message 03/30/23: Her blood counts are improving.  We can recheck this again in a few weeks.   Her kidney function is still below goal.  Can we please make sure that she has a appointment to see nephrology soon?   We also need to make sure that she sees cardiology soon as well.  Can we please make sure that this has been scheduled.  Patient has more questions and wants to speak with the nurse. Please call back.   See results note and previews note  Elora RMA

## 2023-04-09 ENCOUNTER — Encounter: Payer: Self-pay | Admitting: *Deleted

## 2023-04-09 ENCOUNTER — Encounter: Payer: Self-pay | Admitting: Cardiovascular Disease

## 2023-04-09 ENCOUNTER — Ambulatory Visit
Payer: No Typology Code available for payment source | Attending: Cardiovascular Disease | Admitting: Cardiovascular Disease

## 2023-04-09 DIAGNOSIS — E785 Hyperlipidemia, unspecified: Secondary | ICD-10-CM | POA: Diagnosis not present

## 2023-04-09 DIAGNOSIS — E1169 Type 2 diabetes mellitus with other specified complication: Secondary | ICD-10-CM

## 2023-04-09 DIAGNOSIS — I152 Hypertension secondary to endocrine disorders: Secondary | ICD-10-CM

## 2023-04-09 DIAGNOSIS — E1159 Type 2 diabetes mellitus with other circulatory complications: Secondary | ICD-10-CM

## 2023-04-09 DIAGNOSIS — R55 Syncope and collapse: Secondary | ICD-10-CM | POA: Diagnosis not present

## 2023-04-09 DIAGNOSIS — Z7689 Persons encountering health services in other specified circumstances: Secondary | ICD-10-CM

## 2023-04-09 NOTE — Progress Notes (Signed)
 Patient requested monitor to be shipped to her other address [address] [place], [place]  [address].

## 2023-04-09 NOTE — Patient Instructions (Signed)
 Medication Instructions:  Your physician recommends that you continue on your current medications as directed. Please refer to the Current Medication list given to you today.  *If you need a refill on your cardiac medications before your next appointment, please call your pharmacy*   Testing/Procedures: Your physician has requested that you have an echocardiogram. Echocardiography is a painless test that uses sound waves to create images of your heart. It provides your doctor with information about the size and shape of your heart and how well your heart's chambers and valves are working. This procedure takes approximately one hour. There are no restrictions for this procedure. Please do NOT wear cologne, perfume, aftershave, or lotions (deodorant is allowed). Please arrive 15 minutes prior to your appointment time.  Please note: We ask at that you not bring children with you during ultrasound (echo/ vascular) testing. Due to room size and safety concerns, children are not allowed in the ultrasound rooms during exams. Our front office staff cannot provide observation of children in our lobby area while testing is being conducted. An adult accompanying a patient to their appointment will only be allowed in the ultrasound room at the discretion of the ultrasound technician under special circumstances. We apologize for any inconvenience.   [employer] Cardiac Event Monitor Instructions  Your physician has requested you wear your cardiac event monitor for 30 days. [employer] may call or text to confirm a shipping address. The monitor will be sent to a land address via UPS. [employer] will not ship a monitor to a PO BOX. It typically takes 3-5 days to receive your monitor after it has been enrolled. [employer] will assist with USPS tracking if your package is delayed. The telephone number for [employer] is [phone]. Once you have received your monitor, please review the enclosed instructions.  Instruction tutorials can also be viewed under help and settings on the enclosed cell phone. Your monitor has already been registered assigning a specific monitor serial # to you.  Billing and Self Pay Discount Information  [employer] has been provided the insurance information we had on file for you.  If your insurance has been updated, please call [employer] at [phone] to provide them with your updated insurance information.   [employer] offers a discounted Self Pay option for patients who have insurance that does not cover their cardiac event monitor or patients without insurance.  The discounted cost of a Self Pay Cardiac Event Monitor would be $225.00 , if the patient contacts [employer] at [phone] within 7 days of applying the monitor to make payment arrangements.  If the patient does not contact [employer] within 7 days of applying the monitor, the cost of the cardiac event monitor will be $350.00.  Applying the monitor  Remove cell phone from case and turn it on. The cell phone works as IT consultant and needs to be within [employer] of you at all times. The cell phone will need to be charged on a daily basis. We recommend you plug the cell phone into the enclosed charger at your bedside table every night.  Monitor batteries: You will receive two monitor batteries labelled #1 and #2. These are your recorders. Plug battery #2 onto the second connection on the enclosed charger. Keep one battery on the charger at all times. This will keep the monitor battery deactivated. It will also keep it fully charged for when you need to switch your monitor batteries. A small light will be blinking on the battery emblem when it is charging. The  light on the battery emblem will remain on when the battery is fully charged.  Open package of a Monitor strip. Insert battery #1 into black hood on strip and gently squeeze monitor battery onto connection as indicated in instruction booklet. Set  aside while preparing skin.  Choose location for your strip, vertical or horizontal, as indicated in the instruction booklet. Shave to remove all hair from location. There cannot be any lotions, oils, powders, or colognes on skin where monitor is to be applied. Wipe skin clean with enclosed Saline wipe. Dry skin completely.  Peel paper labeled #1 off the back of the Monitor strip exposing the adhesive. Place the monitor on the chest in the vertical or horizontal position shown in the instruction booklet. One arrow on the monitor strip must be pointing upward. Carefully remove paper labeled #2, attaching remainder of strip to your skin. Try not to create any folds or wrinkles in the strip as you apply it.  Firmly press and release the circle in the center of the monitor battery. You will hear a small beep. This is turning the monitor battery on. The heart emblem on the monitor battery will light up every 5 seconds if the monitor battery in turned on and connected to the patient securely. Do not push and hold the circle down as this turns the monitor battery off. The cell phone will locate the monitor battery. A screen will appear on the cell phone checking the connection of your monitor strip. This may read poor connection initially but change to good connection within the next minute. Once your monitor accepts the connection you will hear a series of 3 beeps followed by a climbing crescendo of beeps. A screen will appear on the cell phone showing the two monitor strip placement options. Touch the picture that demonstrates where you applied the monitor strip.  Your monitor strip and battery are waterproof. You are able to shower, bathe, or swim with the monitor on. They just ask you do not submerge deeper than 3 feet underwater. We recommend removing the monitor if you are swimming in a lake, river, or ocean.  Your monitor battery will need to be switched to a fully charged monitor battery  approximately once a week. The cell phone will alert you of an action which needs to be made.  On the cell phone, tap for details to reveal connection status, monitor battery status, and cell phone battery status. The green dots indicates your monitor is in good status. A red dot indicates there is something that needs your attention.  To record a symptom, click the circle on the monitor battery. In 30-60 seconds a list of symptoms will appear on the cell phone. Select your symptom and tap save. Your monitor will record a sustained or significant arrhythmia regardless of you clicking the button. Some patients do not feel the heart rhythm irregularities. [employer] will notify us  of any serious or critical events.  Refer to instruction booklet for instructions on switching batteries, changing strips, the Do not disturb or Pause features, or any additional questions.  Call [employer] at [phone], to confirm your monitor is transmitting and record your baseline. They will answer any questions you may have regarding the monitor instructions at that time.  Returning the monitor to [employer]  Place all equipment back into blue box. Peel off strip of paper to expose adhesive and close box securely. There is a prepaid UPS shipping label on this box. Drop in a UPS drop box,  or at a UPS facility like [employer]. You may also contact [employer] to arrange UPS to pick up monitor package at your home.     Follow-Up: At [facility], you and your health needs are our priority.  As part of our continuing mission to provide you with exceptional heart care, we have created designated Provider Care Teams.  These Care Teams include your primary Cardiologist (physician) and Advanced Practice Providers (APPs -  Physician Assistants and Nurse Practitioners) who all work together to provide you with the care you need, when you need it.  We recommend signing up for the patient portal called  "MyChart".  Sign up information is provided on this After Visit Summary.  MyChart is used to connect with patients for Virtual Visits (Telemedicine).  Patients are able to view lab/test results, encounter notes, upcoming appointments, etc.  Non-urgent messages can be sent to your provider as well.   To learn more about what you can do with MyChart, go to [email or web address].    Your next appointment:   6 month(s)  Provider:   Lauro Portal, MD

## 2023-04-09 NOTE — Assessment & Plan Note (Signed)
 History of essential hypertension on hydrochlorothiazide  with blood pressure measured today in the office at 140/98.

## 2023-04-09 NOTE — Assessment & Plan Note (Signed)
 History of dyslipidemia on statin therapy with lipid profile performed 03/06/2023 revealing total cholesterol 147, LDL 71 and HDL 55.

## 2023-04-09 NOTE — Progress Notes (Signed)
 04/09/2023 Kristy Trujillo   04-29-1950  [id number]  Primary Physician Rodney Clamp, MD Primary Cardiologist: Avanell Leigh MD Bennye Bravo, [place]  HPI:  Kristy Trujillo is a 72 y.o. divorced Caucasian female mother of 2, grandmother of 5 grandchildren who is currently retired.  She is active and walks at the mall and grocery store without symptoms.  Risk factors include treated hypertension and hyperlipidemia.  There is no family history for heart disease.  She is never had a heart attack or stroke.  She denies chest pain or shortness of breath.  She apparently lost consciousness while standing in a line in the grocery store.  There is no prodrome.  EMS was called and noted that her blood pressure was low.  She was not transported to a hospital however.  She had no prior episodes of syncope.   Current Meds  Medication Sig   alclomethasone (ACLOVATE ) 0.05 % cream Apply topically 2 (two) times daily.   atorvastatin  (LIPITOR) 40 MG tablet Take 1 tablet (40 mg total) by mouth daily.   clonazePAM  (KLONOPIN ) 1 MG tablet Take 1 tablet (1 mg total) by mouth 2 (two) times daily as needed. for anxiety   FLUoxetine  (PROZAC ) 40 MG capsule Take 1 capsule (40 mg total) by mouth daily.   gabapentin  (NEURONTIN ) 300 MG capsule Take 1 capsule (300 mg total) by mouth 3 (three) times daily.   hydrochlorothiazide  (HYDRODIURIL ) 25 MG tablet Take 1 tablet (25 mg total) by mouth daily.   hydrOXYzine  (ATARAX ) 25 MG tablet Take 1 tablet (25 mg total) by mouth 3 (three) times daily.   Multiple Vitamins-Minerals (MULTIVITAMINS THER. W/MINERALS) TABS Take 1 tablet by mouth daily.   MYRBETRIQ  25 MG TB24 tablet Take 1 tablet (25 mg total) by mouth daily.   nitrofurantoin , macrocrystal-monohydrate, (MACROBID ) 100 MG capsule Take 1 capsule (100 mg total) by mouth 2 (two) times daily.   OVER THE COUNTER MEDICATION Take 2 tablets by mouth every morning. OTC allergy and sinus tablets   phenazopyridine  (PYRIDIUM )  100 MG tablet Take 1 tablet (100 mg total) by mouth 3 (three) times daily as needed for pain.   triamcinolone  cream (KENALOG ) 0.5 % Apply 1 Application topically 3 (three) times daily.     Allergies  Allergen Reactions   Alcohol Detox [Nutritional Supplements]     Flushing (cough medicine)   Codeine Swelling   Erythromycin Swelling    Social History   Socioeconomic History   Marital status: Divorced    Spouse name: Not on file   Number of children: 2   Years of education: Not on file   Highest education level: Not on file  Occupational History   Occupation: Retired     Comment: Holiday representative   Tobacco Use   Smoking status: Never   Smokeless tobacco: Never  Substance and Sexual Activity   Alcohol use: No   Drug use: No   Sexual activity: Not on file  Other Topics Concern   Not on file  Social History Narrative   Left Handed    Lives in a one story home   Social Drivers of Health   Financial Resource Strain: Low Risk  (10/16/2022)   Overall Financial Resource Strain (CARDIA)    Difficulty of Paying Living Expenses: Not hard at all  Food Insecurity: No Food Insecurity (10/16/2022)   Hunger Vital Sign    Worried About Running Out of Food in the Last Year: Never true    Ran  Out of Food in the Last Year: Never true  Transportation Needs: No Transportation Needs (10/16/2022)   PRAPARE - Administrator, Civil Service (Medical): No    Lack of Transportation (Non-Medical): No  Physical Activity: Inactive (10/16/2022)   Exercise Vital Sign    Days of Exercise per Week: 0 days    Minutes of Exercise per Session: 0 min  Stress: No Stress Concern Present (10/16/2022)   [employer] of Occupational Health - Occupational Stress Questionnaire    Feeling of Stress : Not at all  Social Connections: Moderately Integrated (10/16/2022)   Social Connection and Isolation Panel [NHANES]    Frequency of Communication with Friends and Family: More than three times a week     Frequency of Social Gatherings with Friends and Family: More than three times a week    Attends Religious Services: More than 4 times per year    Active Member of [employer] or Organizations: Yes    Attends Banker Meetings: 1 to 4 times per year    Marital Status: Divorced  Intimate Partner Violence: Not At Risk (10/16/2022)   Humiliation, Afraid, Rape, and Kick questionnaire    Fear of Current or Ex-Partner: No    Emotionally Abused: No    Physically Abused: No    Sexually Abused: No     Review of Systems: General: negative for chills, fever, night sweats or weight changes.  Cardiovascular: negative for chest pain, dyspnea on exertion, edema, orthopnea, palpitations, paroxysmal nocturnal dyspnea or shortness of breath Dermatological: negative for rash Respiratory: negative for cough or wheezing Urologic: negative for hematuria Abdominal: negative for nausea, vomiting, diarrhea, bright red blood per rectum, melena, or hematemesis Neurologic: negative for visual changes, syncope, or dizziness All other systems reviewed and are otherwise negative except as noted above.    Blood pressure (!) 140/98, pulse 66, height 5\' 2"  (1.575 m), weight 151 lb 6.4 oz (68.7 kg), SpO2 99%.  General appearance: alert and no distress Neck: no adenopathy, no carotid bruit, no JVD, supple, symmetrical, trachea midline, and thyroid  not enlarged, symmetric, no tenderness/mass/nodules Lungs: clear to auscultation bilaterally Heart: regular rate and rhythm, S1, S2 normal, no murmur, click, rub or gallop Extremities: extremities normal, atraumatic, no cyanosis or edema Pulses: 2+ and symmetric Skin: Skin color, texture, turgor normal. No rashes or lesions Neurologic: Grossly normal  EKG EKG Interpretation Date/Time:  Monday April 09 2023 09:45:22 EST Ventricular Rate:  66 PR Interval:  172 QRS Duration:  94 QT Interval:  430 QTC Calculation: 450 R Axis:   48  Text Interpretation: Normal  sinus rhythm Normal ECG No previous ECGs available Confirmed by Lauro Portal [phone]) on 04/09/2023 10:02:40 AM    ASSESSMENT AND PLAN:   Hypertension associated with diabetes (HCC) History of essential hypertension on hydrochlorothiazide  with blood pressure measured today in the office at 140/98.  Dyslipidemia associated with type 2 diabetes mellitus (HCC) History of dyslipidemia on statin therapy with lipid profile performed 03/06/2023 revealing total cholesterol 147, LDL 71 and HDL 55.  Syncope and collapse Recent episode of syncope while she was standing in a line in the grocery store.  There were no trip prodromes.  Apparently when EMS was called her blood pressure was low but she was not transported.  She is on hydrochlorothiazide  for essential hypertension.  She did not have associated symptoms suggesting this was vagal.  I am going to get a 2D echo and a 30-day event monitor.  I have cautioned her  that she cannot drive for 6 months.     Avanell Leigh MD FACP,FACC,FAHA, [facility] 04/09/2023 10:13 AM

## 2023-04-09 NOTE — Assessment & Plan Note (Signed)
 Recent episode of syncope while she was standing in a line in the grocery store.  There were no trip prodromes.  Apparently when EMS was called her blood pressure was low but she was not transported.  She is on hydrochlorothiazide  for essential hypertension.  She did not have associated symptoms suggesting this was vagal.  I am going to get a 2D echo and a 30-day event monitor.  I have cautioned her that she cannot drive for 6 months.

## 2023-04-09 NOTE — Progress Notes (Signed)
 Patient enrolled for [employer] / [employer] to ship a 30 day cardiac event monitor to her address on file.

## 2023-04-13 ENCOUNTER — Ambulatory Visit (HOSPITAL_COMMUNITY): Payer: No Typology Code available for payment source | Attending: Cardiovascular Disease

## 2023-04-13 DIAGNOSIS — E1169 Type 2 diabetes mellitus with other specified complication: Secondary | ICD-10-CM | POA: Diagnosis not present

## 2023-04-13 DIAGNOSIS — R55 Syncope and collapse: Secondary | ICD-10-CM | POA: Insufficient documentation

## 2023-04-13 DIAGNOSIS — E1159 Type 2 diabetes mellitus with other circulatory complications: Secondary | ICD-10-CM | POA: Diagnosis not present

## 2023-04-13 DIAGNOSIS — E785 Hyperlipidemia, unspecified: Secondary | ICD-10-CM | POA: Insufficient documentation

## 2023-04-13 DIAGNOSIS — I152 Hypertension secondary to endocrine disorders: Secondary | ICD-10-CM | POA: Diagnosis not present

## 2023-04-13 LAB — ECHOCARDIOGRAM COMPLETE
Area-P 1/2: 3.53 cm2
S' Lateral: 2.6 cm

## 2023-04-16 ENCOUNTER — Other Ambulatory Visit (INDEPENDENT_AMBULATORY_CARE_PROVIDER_SITE_OTHER): Payer: No Typology Code available for payment source

## 2023-04-16 DIAGNOSIS — D619 Aplastic anemia, unspecified: Secondary | ICD-10-CM

## 2023-04-16 LAB — CBC WITH DIFFERENTIAL/PLATELET
Absolute Lymphocytes: 1859 {cells}/uL (ref 850–3900)
Absolute Monocytes: 428 {cells}/uL (ref 200–950)
Basophils Absolute: 88 {cells}/uL (ref 0–200)
Basophils Relative: 1.4 %
Eosinophils Absolute: 233 {cells}/uL (ref 15–500)
Eosinophils Relative: 3.7 %
HCT: 31.9 % — ABNORMAL LOW (ref 35.0–45.0)
Hemoglobin: 10.2 g/dL — ABNORMAL LOW (ref 11.7–15.5)
MCH: 27.5 pg (ref 27.0–33.0)
MCHC: 32 g/dL (ref 32.0–36.0)
MCV: 86 fL (ref 80.0–100.0)
MPV: 10.3 fL (ref 7.5–12.5)
Monocytes Relative: 6.8 %
Neutro Abs: 3692 {cells}/uL (ref 1500–7800)
Neutrophils Relative %: 58.6 %
Platelets: 275 10*3/uL (ref 140–400)
RBC: 3.71 10*6/uL — ABNORMAL LOW (ref 3.80–5.10)
RDW: 12.6 % (ref 11.0–15.0)
Total Lymphocyte: 29.5 %
WBC: 6.3 10*3/uL (ref 3.8–10.8)

## 2023-04-18 NOTE — Progress Notes (Signed)
Her blood counts are stable.  Can we check on the status of her peripheral smear?

## 2023-04-24 ENCOUNTER — Telehealth: Payer: Self-pay | Admitting: *Deleted

## 2023-04-24 NOTE — Telephone Encounter (Signed)
 Spoke wit lab tech, stated [facility] are unable to process lab  Some one from [facility] will contact patient  With mor information about lab test

## 2023-04-24 NOTE — Telephone Encounter (Signed)
 Copied from CRM [id number]. Topic: Clinical - Lab/Test Results >> Apr 20, 2023  3:09 PM Kristy Trujillo wrote: Reason for CRM: Patient is checking on lab results. Informed patient the blood smear is still pending. Please call patient when results are in.   Patient notified smear results not in yet  [facility]

## 2023-04-24 NOTE — Telephone Encounter (Signed)
 Can we please check with the lab to see what needs to be done to have this resulted. It has already been collected twice.  Katina Degree. [name], MD 04/24/2023 2:58 PM

## 2023-04-27 NOTE — Telephone Encounter (Signed)
 PT called re: results. Relayed previous message to Patient. Patient states has not received a call yet.

## 2023-04-30 NOTE — Telephone Encounter (Signed)
 Patient called again re: Labs. Transferred call to [employer]/gallery exhibitions officer)

## 2023-05-02 DIAGNOSIS — R350 Frequency of micturition: Secondary | ICD-10-CM | POA: Diagnosis not present

## 2023-05-02 DIAGNOSIS — N3946 Mixed incontinence: Secondary | ICD-10-CM | POA: Diagnosis not present

## 2023-05-03 ENCOUNTER — Other Ambulatory Visit: Payer: Self-pay | Admitting: Family Medicine

## 2023-05-03 MED ORDER — HYDROXYZINE HCL 25 MG PO TABS: 25.0000 mg | ORAL_TABLET | Freq: Three times a day (TID) | ORAL | 9 refills | Status: DC

## 2023-05-03 MED ORDER — CLONAZEPAM 1 MG PO TABS: 1.0000 mg | ORAL_TABLET | Freq: Two times a day (BID) | ORAL | 1 refills | Status: DC | PRN

## 2023-05-03 NOTE — Telephone Encounter (Signed)
 Patient requesting a call re: smear results

## 2023-05-03 NOTE — Telephone Encounter (Signed)
 Copied from CRM [id number]. Topic: Clinical - Prescription Issue >> May 03, 2023  8:36 AM Theodis Sato wrote: Reason for CRM:  Patient states her pharmacy denies having refills for both clonazePAM (KLONOPIN) 1 MG tablet // hydrOXYzine (ATARAX) 25 MG table - Advised patient to call her pharmacy again because per her chart she has refills for both medication, patient is requesting Dr. [name] to re send them anyway.

## 2023-05-03 NOTE — Telephone Encounter (Signed)
 Per patient pharmacy no refills on file, please see and advise

## 2023-05-03 NOTE — Addendum Note (Signed)
 Addended by: Royann Shivers on: 05/03/2023 12:20 PM   Modules accepted: Orders

## 2023-05-04 NOTE — Telephone Encounter (Signed)
 Tammy, did you speak to pt?

## 2023-05-07 ENCOUNTER — Telehealth: Payer: Self-pay

## 2023-05-07 NOTE — Telephone Encounter (Addendum)
 Patient will need a referral entered to [facility] for additional testing that our labs are unable to perform at this time.    I have left patient VM to call back in regard.   Dr. [name] please advise.

## 2023-05-07 NOTE — Telephone Encounter (Signed)
 Copied from CRM [id number]. Topic: Clinical - Medication Refill >> May 04, 2023  4:01 PM Aletta Edouard wrote: Most Recent Primary Care Visit:  Provider: [facility]  Department: [facility]  Visit Type: LAB  Date: 04/16/2023  Medication: hydrochlorothiazide (HYDRODIURIL) 25 MG tablet [[id number]]   90 day supply   Has the patient contacted their pharmacy? Yes (Agent: If no, request that the patient contact the pharmacy for the refill. If patient does not wish to contact the pharmacy document the reason why and proceed with request.) (Agent: If yes, when and what did the pharmacy advise?)  Is this the correct pharmacy for this prescription? Yes If no, delete pharmacy and type the correct one.  This is the patient's preferred pharmacy:  [facility] [address], [place] - [address] [place] [address] Phone: [phone] Fax: [phone]   Has the prescription been filled recently? Yes  Is the patient out of the medication? No  Has the patient been seen for an appointment in the last year OR does the patient have an upcoming appointment? Yes  Can we respond through MyChart? No       Patient needs 90 day supply  Agent: Please be advised that Rx refills may take up to 3 business days. We ask that you follow-up with your pharmacy.  Pt advised correctly; last refill sent in for 90 day supply on 03/06/23 and also has refills. Pt needs to contact pharmacy as told by [facility] dept. Nothing further needed at this time.

## 2023-05-07 NOTE — Telephone Encounter (Signed)
 Copied from CRM [id number]. Topic: Clinical - Lab/Test Results >> May 07, 2023 11:38 AM Drema Balzarine wrote: Reason for CRM: Patient requested call back from Dr. Rhys Martini nurse regarding lab results  Please see call notes in chart, pt stated nothing further needed at this time. She received information with [facility] regarding labs. This is a duplicate encounter

## 2023-05-08 ENCOUNTER — Telehealth (INDEPENDENT_AMBULATORY_CARE_PROVIDER_SITE_OTHER): Admitting: Family Medicine

## 2023-05-08 VITALS — Ht 62.0 in | Wt 151.0 lb

## 2023-05-08 DIAGNOSIS — D649 Anemia, unspecified: Secondary | ICD-10-CM

## 2023-05-08 DIAGNOSIS — N184 Chronic kidney disease, stage 4 (severe): Secondary | ICD-10-CM | POA: Diagnosis not present

## 2023-05-08 DIAGNOSIS — I152 Hypertension secondary to endocrine disorders: Secondary | ICD-10-CM

## 2023-05-08 DIAGNOSIS — N3281 Overactive bladder: Secondary | ICD-10-CM

## 2023-05-08 DIAGNOSIS — E1159 Type 2 diabetes mellitus with other circulatory complications: Secondary | ICD-10-CM

## 2023-05-08 MED ORDER — NITROFURANTOIN MONOHYD MACRO 100 MG PO CAPS: 100.0000 mg | ORAL_CAPSULE | Freq: Two times a day (BID) | ORAL | 0 refills | Status: DC

## 2023-05-08 MED ORDER — OXYBUTYNIN CHLORIDE ER 10 MG PO TB24
10.0000 mg | ORAL_TABLET | Freq: Every day | ORAL | 5 refills | Status: DC
Start: 2023-05-08 — End: 2023-06-06

## 2023-05-08 NOTE — Telephone Encounter (Signed)
 Our lab is not able to perform her peripheral smear. Recommend referral to hematology.  Kristy Trujillo. [name], MD 05/08/2023 7:34 AM

## 2023-05-08 NOTE — Progress Notes (Signed)
   Kristy Trujillo is a 73 y.o. female who presents today for a virtual office visit.  Assessment/Plan:  New/Acute Problems: Urinary tract infection Discussed limitations of virtual exam and inability to perform physical exam.  Her symptoms are consistent with past UTIs.  Her UA at urology did show positive WBCs and leukocyte esterase however I am not able to see culture results.  Will empirically start Macrobid 100 mg twice daily until she can get final results from urology.  Encouraged hydration.  We discussed reasons to return to care or seek emergent care.  Normocytic anemia Her workup here including B12 and iron panel were normal.  Her lab was not able to complete a peripheral smear though her LDH and haptoglobin were normal.  This is likely related to her CKD however we will refer to hematology for further evaluation as we are not able to perform a peripheral smear in our lab.  Syncopal episode She has been referred to cardiology.  Echocardiogram was negative.  Has 30-day event monitor pending.  Chronic Problems Addressed Today: OAB (overactive bladder) Seeing urogynecology.  They recently started her on oxybutynin per their note however she states that she was not able to get this at the pharmacy.  Will refill this today for her.  Advised her to follow-up with urology soon for ongoing management.  CKD (chronic kidney disease) stage 4, GFR 15-29 ml/min (HCC) She was referred to nephrology for this.  Recent GFR is 24.6.     Subjective:  HPI:  See A/P for status of chronic conditions.  Patient is here today to follow-up on recent labs.  I last saw her about 6 weeks ago.  At that time we discussed her recent syncopal episode and refer her to see cardiology.  We are also working up her recent labs which showed downtrending hemoglobin.  We attempted to obtain peripheral smear however we were not able to perform this at our lab after multiple attempts.  She has completed her echocardiogram.   She is still waiting on complaining her event monitor for syncopal episode.  Since her last visit she also saw the urologist.  They were concerned about UTI.  Apparently they had labs at that time however she has not heard any results back.  She is still having a lot of frequency and burning.  She is concerned that she may have a UTI.  They also started her on oxybutynin however she was not able to pick this up from the pharmacy.        Objective/Observations  Physical Exam: Gen: NAD, resting comfortably Pulm: Normal work of breathing Neuro: Grossly normal, moves all extremities Psych: Normal affect and thought content  Virtual Visit via Video   I connected with Kristy Trujillo on 05/08/23 at  2:20 PM EDT by a video enabled telemedicine application and verified that I am speaking with the correct person using two identifiers. The limitations of evaluation and management by telemedicine and the availability of in person appointments were discussed. The patient expressed understanding and agreed to proceed.   Patient location: Home Provider location: [place] [employer] Persons participating in the virtual visit: Myself and Patient     Kristy Trujillo. [name], MD 05/08/2023 2:50 PM

## 2023-05-08 NOTE — Assessment & Plan Note (Signed)
 She was referred to nephrology for this.  Recent GFR is 24.6.

## 2023-05-08 NOTE — Telephone Encounter (Signed)
 Dr. [name] spoke to pt.

## 2023-05-08 NOTE — Assessment & Plan Note (Signed)
 Seeing urogynecology.  They recently started her on oxybutynin per their note however she states that she was not able to get this at the pharmacy.  Will refill this today for her.  Advised her to follow-up with urology soon for ongoing management.

## 2023-05-16 ENCOUNTER — Telehealth: Payer: Self-pay | Admitting: Family Medicine

## 2023-05-16 NOTE — Telephone Encounter (Signed)
 Spoke with patient. Notified will call back with referral information

## 2023-05-16 NOTE — Telephone Encounter (Unsigned)
 Copied from CRM [id number]. Topic: Referral - Question >> May 16, 2023 12:27 PM Theodis Sato wrote: Reason for CRM: Patient is requesting to speak with Dr. Rhys Martini nurse about her Cardiology referral and oncology referral. Advised patient I can offer her the phone numbers and clinic information of the referrals, patient refused and demanded to speak with Dr. Rhys Martini nurse.

## 2023-05-17 NOTE — Telephone Encounter (Signed)
 Schedule an appointment with hematology Dr Leonides Schanz  Appt on June 04 2023 at 01:40 pm arriving 103 mi early  Lab appt at 2:30pm  At [facility]  [address] [place]  Appt information given to patient

## 2023-05-18 ENCOUNTER — Telehealth: Payer: Self-pay | Admitting: Family Medicine

## 2023-05-18 NOTE — Telephone Encounter (Signed)
 See note

## 2023-05-18 NOTE — Telephone Encounter (Signed)
 Left message to return call to our office at their convenience.

## 2023-05-18 NOTE — Telephone Encounter (Signed)
 Can we clarify? Her visit is in a clinic - this should be covered by her insurance.  Katina Degree. [name], MD 05/18/2023 9:17 AM

## 2023-05-18 NOTE — Telephone Encounter (Signed)
 Pt called wanting to give info to Dr [name].   Patient Name First: Kristy Last: Trujillo Gender: Female DOB: Apr 04, 1950 Age: 73 Y 9 M 20 D Return Phone Number: [phone] (Primary) Address: City/ State/ Zip: [place]  [address] Client [facility] - Human resources officer [facility] [employer] Provider Jacquiline Doe- MD Contact Type Call Who Is Calling Patient / Member / Family / Caregiver Caller Name Declined to provide Caller Phone Number [phone] Call Type Message Only Information Provided Reason for Call Request for General Office Information Initial Comment Caller states they messed up some lab tests and wants to send her to a hospital to have tests w/ oncology; insurance will not pay for hospital visit; April 7 has 2 appts; states BS is not high; Part of the tests were not done; Msg to Dr [name]; Jethro Bolus declined triage; Please call to discuss how to proceed, Concerned leakage from bowels after surgery w/gynecologist may have affected results. Translation No Disp. Time Lamount Cohen Time) Disposition Final User 05/18/2023 6:23:45 AM General Information Provided Yes Albin Fischer Final Disposition 05/18/2023 6:23:45 AM General Information Provided Yes Albin Fischer

## 2023-05-18 NOTE — Telephone Encounter (Signed)
 Pt returned a call. Requesting a call back

## 2023-05-21 NOTE — Telephone Encounter (Signed)
 Left message to return call to our office at their convenience.

## 2023-06-04 ENCOUNTER — Inpatient Hospital Stay (HOSPITAL_BASED_OUTPATIENT_CLINIC_OR_DEPARTMENT_OTHER): Admitting: Hematology and Oncology

## 2023-06-04 ENCOUNTER — Inpatient Hospital Stay: Attending: Hematology and Oncology

## 2023-06-04 VITALS — BP 145/63 | HR 98 | Temp 97.5°F | Resp 16 | Wt 147.6 lb

## 2023-06-04 DIAGNOSIS — D500 Iron deficiency anemia secondary to blood loss (chronic): Secondary | ICD-10-CM

## 2023-06-04 DIAGNOSIS — E1122 Type 2 diabetes mellitus with diabetic chronic kidney disease: Secondary | ICD-10-CM | POA: Diagnosis not present

## 2023-06-04 DIAGNOSIS — Z79899 Other long term (current) drug therapy: Secondary | ICD-10-CM | POA: Diagnosis not present

## 2023-06-04 DIAGNOSIS — D649 Anemia, unspecified: Secondary | ICD-10-CM

## 2023-06-04 DIAGNOSIS — N189 Chronic kidney disease, unspecified: Secondary | ICD-10-CM | POA: Insufficient documentation

## 2023-06-04 LAB — CMP (CANCER CENTER ONLY)
ALT: 24 U/L (ref 0–44)
AST: 28 U/L (ref 15–41)
Albumin: 4.1 g/dL (ref 3.5–5.0)
Alkaline Phosphatase: 122 U/L (ref 38–126)
Anion gap: 9 (ref 5–15)
BUN: 57 mg/dL — ABNORMAL HIGH (ref 8–23)
CO2: 22 mmol/L (ref 22–32)
Calcium: 9 mg/dL (ref 8.9–10.3)
Chloride: 105 mmol/L (ref 98–111)
Creatinine: 2.61 mg/dL — ABNORMAL HIGH (ref 0.44–1.00)
GFR, Estimated: 19 mL/min — ABNORMAL LOW (ref >60)
Glucose, Bld: 151 mg/dL — ABNORMAL HIGH (ref 70–99)
Potassium: 4.1 mmol/L (ref 3.5–5.1)
Sodium: 136 mmol/L (ref 135–145)
Total Bilirubin: 0.4 mg/dL (ref 0.0–1.2)
Total Protein: 7.3 g/dL (ref 6.5–8.1)

## 2023-06-04 LAB — RETIC PANEL
Immature Retic Fract: 10.5 % (ref 2.3–15.9)
RBC.: 3.83 MIL/uL — ABNORMAL LOW (ref 3.87–5.11)
Retic Count, Absolute: 104.6 10*3/uL (ref 19.0–186.0)
Retic Ct Pct: 2.7 % (ref 0.4–3.1)
Reticulocyte Hemoglobin: 31.5 pg (ref >27.9)

## 2023-06-04 LAB — CBC WITH DIFFERENTIAL (CANCER CENTER ONLY)
Abs Immature Granulocytes: 0.02 10*3/uL (ref 0.00–0.07)
Basophils Absolute: 0.1 10*3/uL (ref 0.0–0.1)
Basophils Relative: 1 %
Eosinophils Absolute: 0.2 10*3/uL (ref 0.0–0.5)
Eosinophils Relative: 2 %
HCT: 32.1 % — ABNORMAL LOW (ref 36.0–46.0)
Hemoglobin: 10.7 g/dL — ABNORMAL LOW (ref 12.0–15.0)
Immature Granulocytes: 0 %
Lymphocytes Relative: 18 %
Lymphs Abs: 1.6 10*3/uL (ref 0.7–4.0)
MCH: 27.4 pg (ref 26.0–34.0)
MCHC: 33.3 g/dL (ref 30.0–36.0)
MCV: 82.3 fL (ref 80.0–100.0)
Monocytes Absolute: 0.6 10*3/uL (ref 0.1–1.0)
Monocytes Relative: 7 %
Neutro Abs: 6.3 10*3/uL (ref 1.7–7.7)
Neutrophils Relative %: 72 %
Platelet Count: 316 10*3/uL (ref 150–400)
RBC: 3.9 MIL/uL (ref 3.87–5.11)
RDW: 14 % (ref 11.5–15.5)
WBC Count: 8.7 10*3/uL (ref 4.0–10.5)
nRBC: 0 % (ref 0.0–0.2)

## 2023-06-04 LAB — FOLATE: Folate: 22.8 ng/mL (ref >5.9)

## 2023-06-04 LAB — VITAMIN B12: Vitamin B-12: 1015 pg/mL — ABNORMAL HIGH (ref 180–914)

## 2023-06-04 NOTE — Progress Notes (Unsigned)
 [facility] [phone]   [phone]  INITIAL CONSULT NOTE  Patient Care Team: Ardith Dark, MD as PCP - General (Family Medicine) Antony Madura, MD as Consulting Physician (Neurology) Runell Gess, MD as Consulting Physician (Cardiology)  Hematological/Oncological History # Normocytic Anemia 06/04/2023: establish care with Dr. Leonides Schanz   CHIEF COMPLAINTS/PURPOSE OF CONSULTATION:  "Normocytic Anemia "  HISTORY OF PRESENTING ILLNESS:  Kristy Trujillo 73 y.o. female with medical history significant for gallstones and shingles as well as CKD, type 2 diabetes, and hyperlipidemia who presents for evaluation of normocytic anemia.  On exam today [name] is accompanied by her sister.  She reports that she had been in the store recently and "passed out".  She is found to have a quite low blood pressure.  She is not having any lightheadedness, or shortness of breath, but is having some occasional bouts of issues with balance and dizziness.  She reports that she has not had any serious falls with the exception of her syncopal episode.  She reports she does have a "ongoing UTI" which has been going on for months.  She has been on different antibiotic therapies and is currently on nitrofurantoin.  She has not been having any trouble with bleeding, bruising, or dark stools.  She reports she eats a regular diet and does eat some red meat but prefers chicken.  She notes that her father also had issues with anemia.  In terms of her CKD she is in the process of establishing with nephrology and is working on getting an appointment with them.  On further discussion [name] reports that her father had anemia and her sister is healthy.  Her mother had no major medical issues.  She reports she has 2 healthy children as well.  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  She otherwise denies any fevers,  chills, sweats, nausea, vomiting or diarrhea.  A full 10 point ROS is otherwise negative.  MEDICAL HISTORY:  Past Medical History:  Diagnosis Date   Gallstones    Shingles     SURGICAL HISTORY: Past Surgical History:  Procedure Laterality Date   CHOLECYSTECTOMY      SOCIAL HISTORY: Social History   Socioeconomic History   Marital status: Divorced    Spouse name: Not on file   Number of children: 2   Years of education: Not on file   Highest education level: Not on file  Occupational History   Occupation: Retired     Comment: Holiday representative   Tobacco Use   Smoking status: Never   Smokeless tobacco: Never  Substance and Sexual Activity   Alcohol use: No   Drug use: No   Sexual activity: Not on file  Other Topics Concern   Not on file  Social History Narrative   Left Handed    Lives in a one story home   Social Drivers of Health   Financial Resource Strain: Low Risk  (10/16/2022)   Overall Financial Resource Strain (CARDIA)    Difficulty of Paying Living Expenses: Not hard at all  Food Insecurity: No Food Insecurity (10/16/2022)   Hunger Vital Sign    Worried About Running Out of Food in the Last Year: Never true    Ran Out of Food in the Last Year: Never true  Transportation Needs: No Transportation Needs (10/16/2022)   PRAPARE - Administrator, Civil Service (Medical): No  Lack of Transportation (Non-Medical): No  Physical Activity: Inactive (10/16/2022)   Exercise Vital Sign    Days of Exercise per Week: 0 days    Minutes of Exercise per Session: 0 min  Stress: No Stress Concern Present (10/16/2022)   [employer] of Occupational Health - Occupational Stress Questionnaire    Feeling of Stress : Not at all  Social Connections: Moderately Integrated (10/16/2022)   Social Connection and Isolation Panel [NHANES]    Frequency of Communication with Friends and Family: More than three times a week    Frequency of Social Gatherings with Friends and  Family: More than three times a week    Attends Religious Services: More than 4 times per year    Active Member of [employer] or Organizations: Yes    Attends Banker Meetings: 1 to 4 times per year    Marital Status: Divorced  Catering manager Violence: Not At Risk (10/16/2022)   Humiliation, Afraid, Rape, and Kick questionnaire    Fear of Current or Ex-Partner: No    Emotionally Abused: No    Physically Abused: No    Sexually Abused: No    FAMILY HISTORY: Family History  Problem Relation Age of Onset   Diabetes Neg Hx    High Cholesterol Neg Hx    Hypertension Neg Hx    Colon cancer Neg Hx    Breast cancer Neg Hx     ALLERGIES:  is allergic to alcohol detox [nutritional supplements], codeine, and erythromycin.  MEDICATIONS:  Current Outpatient Medications  Medication Sig Dispense Refill   alclomethasone (ACLOVATE) 0.05 % cream Apply topically 2 (two) times daily. 30 g 0   atorvastatin (LIPITOR) 40 MG tablet Take 1 tablet (40 mg total) by mouth daily. 90 tablet 3   clonazePAM (KLONOPIN) 1 MG tablet Take 1 tablet (1 mg total) by mouth 2 (two) times daily as needed. for anxiety 180 tablet 1   FLUoxetine (PROZAC) 40 MG capsule Take 1 capsule (40 mg total) by mouth daily. 90 capsule 3   gabapentin (NEURONTIN) 300 MG capsule Take 1 capsule (300 mg total) by mouth 3 (three) times daily. 270 capsule 3   hydrochlorothiazide (HYDRODIURIL) 25 MG tablet Take 1 tablet (25 mg total) by mouth daily. 90 tablet 3   hydrOXYzine (ATARAX) 25 MG tablet Take 1 tablet (25 mg total) by mouth 3 (three) times daily. 90 tablet 9   Multiple Vitamins-Minerals (MULTIVITAMINS THER. W/MINERALS) TABS Take 1 tablet by mouth daily. (Patient not taking: Reported on 06/04/2023)     nitrofurantoin, macrocrystal-monohydrate, (MACROBID) 100 MG capsule Take 1 capsule (100 mg total) by mouth 2 (two) times daily. 14 capsule 0   OVER THE COUNTER MEDICATION Take 2 tablets by mouth every morning. OTC allergy and sinus  tablets     oxybutynin (DITROPAN XL) 10 MG 24 hr tablet Take 1 tablet (10 mg total) by mouth at bedtime. 30 tablet 5   phenazopyridine (PYRIDIUM) 100 MG tablet Take 1 tablet (100 mg total) by mouth 3 (three) times daily as needed for pain. (Patient not taking: Reported on 06/04/2023) 6 tablet 0   triamcinolone cream (KENALOG) 0.5 % Apply 1 Application topically 3 (three) times daily. 30 g 0   No current facility-administered medications for this visit.    REVIEW OF SYSTEMS:   Constitutional: ( - ) fevers, ( - )  chills , ( - ) night sweats Eyes: ( - ) blurriness of vision, ( - ) double vision, ( - ) watery eyes  Ears, nose, mouth, throat, and face: ( - ) mucositis, ( - ) sore throat Respiratory: ( - ) cough, ( - ) dyspnea, ( - ) wheezes Cardiovascular: ( - ) palpitation, ( - ) chest discomfort, ( - ) lower extremity swelling Gastrointestinal:  ( - ) nausea, ( - ) heartburn, ( - ) change in bowel habits Skin: ( - ) abnormal skin rashes Lymphatics: ( - ) new lymphadenopathy, ( - ) easy bruising Neurological: ( - ) numbness, ( - ) tingling, ( - ) new weaknesses Behavioral/Psych: ( - ) mood change, ( - ) new changes  All other systems were reviewed with the patient and are negative.  PHYSICAL EXAMINATION:  Vitals:   06/04/23 1313  BP: (!) 145/63  Pulse: 98  Resp: 16  Temp: (!) 97.5 F (36.4 C)  SpO2: 100%   Filed Weights   06/04/23 1313  Weight: 147 lb 9.6 oz (67 kg)    GENERAL: well appearing elderly Caucasian female in NAD  SKIN: skin color, texture, turgor are normal, no rashes or significant lesions EYES: conjunctiva are pink and non-injected, sclera clear LUNGS: clear to auscultation and percussion with normal breathing effort HEART: regular rate & rhythm and no murmurs and no lower extremity edema Musculoskeletal: no cyanosis of digits and no clubbing  PSYCH: alert & oriented x 3, fluent speech NEURO: no focal motor/sensory deficits  LABORATORY DATA:  I have reviewed the  data as listed    Latest Ref Rng & Units 06/04/2023    2:21 PM 04/16/2023    1:12 PM 03/28/2023   10:45 AM  CBC  WBC 4.0 - 10.5 K/uL 8.7  6.3  6.7   Hemoglobin 12.0 - 15.0 g/dL 91.4  78.2  95.6   Hematocrit 36.0 - 46.0 % 32.1  31.9  33.1   Platelets 150 - 400 K/uL 316  275  282        Latest Ref Rng & Units 06/04/2023    2:21 PM 03/28/2023   10:45 AM 03/06/2023    2:37 PM  CMP  Glucose 70 - 99 mg/dL 213  086  578   BUN 8 - 23 mg/dL 57  48  50   Creatinine 0.44 - 1.00 mg/dL 4.69  6.29  5.28   Sodium 135 - 145 mmol/L 136  141  140   Potassium 3.5 - 5.1 mmol/L 4.1  4.5  3.6   Chloride 98 - 111 mmol/L 105  107  106   CO2 22 - 32 mmol/L 22  26  25    Calcium 8.9 - 10.3 mg/dL 9.0  8.8  8.7   Total Protein 6.5 - 8.1 g/dL 7.3  6.8  6.9   Total Bilirubin 0.0 - 1.2 mg/dL 0.4  0.3  0.4   Alkaline Phos 38 - 126 U/L 122  144  135   AST 15 - 41 U/L 28  42  28   ALT 0 - 44 U/L 24  33  25      ASSESSMENT & PLAN Scherrie November 73 y.o. female with medical history significant for gallstones and shingles as well as CKD, type 2 diabetes, and hyperlipidemia who presents for evaluation of normocytic anemia.  After review of the labs, review of the records, and discussion with the patient the patients findings are most consistent with normocytic anemia, at this time suspect it is due to her CKD.  # Normocytic Anemia -- Will order full nutritional studies to include vitamin B12, folate,  and methylmalonic acid. Iron panel ordered previously in Jan 2025.  -- Will order reticulocytes. Prior LDH and Haptoglobin within normal limits.  -- Rule out multiple myeloma with SPEP and serum free light chains -- will order EPO levels to assure kidneys are working properly. Patient has elevated Cr, CKD may be the source of the patient's anemia.  -- If no clear etiology can be found we will need to pursue a bone marrow biopsy. -- Return to clinic pending the results of the above studies.   Orders Placed This  Encounter  Procedures   CBC with Differential (Cancer Center Only)    Standing Status:   Future    Number of Occurrences:   1    Expiration Date:   06/03/2024   CMP (Cancer Center only)    Standing Status:   Future    Number of Occurrences:   1    Expiration Date:   06/03/2024   Retic Panel    Standing Status:   Future    Number of Occurrences:   1    Expiration Date:   06/03/2024   Vitamin B12    Standing Status:   Future    Number of Occurrences:   1    Expiration Date:   06/03/2024   Methylmalonic acid, serum    Standing Status:   Future    Number of Occurrences:   1    Expiration Date:   06/03/2024   Folate, Serum    Standing Status:   Future    Number of Occurrences:   1    Expiration Date:   06/03/2024   Multiple Myeloma Panel (SPEP&IFE w/QIG)    Standing Status:   Future    Number of Occurrences:   1    Expiration Date:   06/03/2024   Kappa/lambda light chains    Standing Status:   Future    Number of Occurrences:   1    Expiration Date:   06/03/2024   Erythropoietin    Standing Status:   Future    Number of Occurrences:   1    Expiration Date:   06/03/2024    All questions were answered. The patient knows to call the clinic with any problems, questions or concerns.  A total of more than 60 minutes were spent on this encounter with face-to-face time and non-face-to-face time, including preparing to see the patient, ordering tests and/or medications, counseling the patient and coordination of care as outlined above.   Ulysees Barns, MD Department of Hematology/Oncology [facility] Phone: [phone] Pager: [phone] Email: [email or web address]  06/05/2023 4:29 PM

## 2023-06-05 ENCOUNTER — Ambulatory Visit: Payer: Self-pay

## 2023-06-05 LAB — KAPPA/LAMBDA LIGHT CHAINS
Kappa free light chain: 36.8 mg/L — ABNORMAL HIGH (ref 3.3–19.4)
Kappa, lambda light chain ratio: 1.29 (ref 0.26–1.65)
Lambda free light chains: 28.6 mg/L — ABNORMAL HIGH (ref 5.7–26.3)

## 2023-06-05 LAB — ERYTHROPOIETIN: Erythropoietin: 6.1 m[IU]/mL (ref 2.6–18.5)

## 2023-06-05 NOTE — Telephone Encounter (Signed)
 Please advise does patient need an appointment /does she need to leave a sample?

## 2023-06-05 NOTE — Telephone Encounter (Signed)
Recommended OV

## 2023-06-05 NOTE — Telephone Encounter (Signed)
 FYI

## 2023-06-05 NOTE — Telephone Encounter (Signed)
 Copied from CRM [id number]. Topic: Clinical - Red Word Triage >> Jun 05, 2023 12:14 PM Antwanette L wrote: Red Word that prompted transfer to Nurse Triage: Patient is calling back in for uti pain  Patient calling back in saying that she is having a lot of burning with urination and she needs something done today. Patient stated that she wanted a phone or virtual visit but this RN advised her that if she was having a urinary issue, the provider would more than likely want to get a urine sample. Patient states that she does not have a way to get to the office and patient was getting frustrated at this point. This RN called the [facility] and spoke with staff--they were familiar with this patient and this patient was transferred through to them so they could better assist her at this time. Reason for Disposition . [1] Caller requesting NON-URGENT health information AND [2] PCP's office is the best resource  Answer Assessment - Initial Assessment Questions Patient calling back in saying that she is having a lot of burning with urination and she needs something done today. Patient stated that she wanted a phone or virtual visit but this RN advised her that if she was having a urinary issue, the provider would more than likely want to get a urine sample. Patient states that she does not have a way to get to the office and patient was getting frustrated at this point. This RN called the [facility] and spoke with staff--they were familiar with this patient and this patient was transferred through to them so they could better assist her at this time.  Protocols used: Information Only Call - No Triage-A-AH

## 2023-06-05 NOTE — Telephone Encounter (Signed)
 We sent in antibiotic(s) via virtual visit a month ago. Strongly recommend she come in for urine testing. If she is unable to, then we can send in kelfex 500 mg twice daily for 7 days but she needs an appointment ASAP.

## 2023-06-05 NOTE — Telephone Encounter (Signed)
  Chief Complaint: urinary symptoms Symptoms: burning with urination, flank pain Frequency: 2 days Pertinent Negatives: Patient denies fever, blood in urine Disposition: [] ED /[] Urgent Care (no appt availability in office) / [x] Appointment(In office/virtual)/ []  [facility]/ [] Home Care/ [x] Refused Recommended Disposition /[] [facility]/ []  Follow-up with PCP Additional Notes: Patient reports she believes she has a UTI. She states that she is experiencing burning with urination and flank pain x 2 days. Per protocol, attempted to schedule appt to be seen, but patient refused and said she just wants an antibiotic called in. Advised patient would request but cannot promise it will be called in without an appt. Advised patient to call with worsening symptoms. Patient verbalized understanding.     Copied from CRM [id number]. Topic: Clinical - Red Word Triage >> Jun 05, 2023 11:13 AM [place] A wrote: [facility] that prompted transfer to Nurse Triage: Patient has possible UTI pain level is a 10 Reason for Disposition  Side (flank) or lower back pain present  Answer Assessment - Initial Assessment Questions 1. SYMPTOM: "What's the main symptom you're concerned about?" (e.g., frequency, incontinence)     Burning with urination 2. ONSET: "When did the burning start?"     2 days 3. PAIN: "Is there any pain?" If Yes, ask: "How bad is it?" (Scale: 1-10; mild, moderate, severe)     severe 4. CAUSE: "What do you think is causing the symptoms?"     UTI 5. OTHER SYMPTOMS: "Do you have any other symptoms?" (e.g., blood in urine, fever, flank pain, pain with urination) Flank pain  Protocols used: Urinary Symptoms-A-AH

## 2023-06-06 ENCOUNTER — Ambulatory Visit (INDEPENDENT_AMBULATORY_CARE_PROVIDER_SITE_OTHER): Admitting: Family Medicine

## 2023-06-06 ENCOUNTER — Encounter: Payer: Self-pay | Admitting: Family Medicine

## 2023-06-06 VITALS — BP 150/81 | HR 79 | Temp 97.3°F | Ht 62.0 in | Wt 145.8 lb

## 2023-06-06 DIAGNOSIS — R309 Painful micturition, unspecified: Secondary | ICD-10-CM | POA: Diagnosis not present

## 2023-06-06 DIAGNOSIS — N3281 Overactive bladder: Secondary | ICD-10-CM

## 2023-06-06 DIAGNOSIS — E119 Type 2 diabetes mellitus without complications: Secondary | ICD-10-CM

## 2023-06-06 DIAGNOSIS — I152 Hypertension secondary to endocrine disorders: Secondary | ICD-10-CM | POA: Diagnosis not present

## 2023-06-06 DIAGNOSIS — E1159 Type 2 diabetes mellitus with other circulatory complications: Secondary | ICD-10-CM | POA: Diagnosis not present

## 2023-06-06 LAB — POCT URINALYSIS DIPSTICK
Bilirubin, UA: NEGATIVE
Blood, UA: POSITIVE
Glucose, UA: NEGATIVE
Ketones, UA: NEGATIVE
Nitrite, UA: NEGATIVE
Protein, UA: POSITIVE — AB
Spec Grav, UA: 1.025 (ref 1.010–1.025)
Urobilinogen, UA: 0.2 U/dL
pH, UA: 5.5 (ref 5.0–8.0)

## 2023-06-06 LAB — MULTIPLE MYELOMA PANEL, SERUM
Albumin SerPl Elph-Mcnc: 3.5 g/dL (ref 2.9–4.4)
Albumin/Glob SerPl: 1.2 (ref 0.7–1.7)
Alpha 1: 0.3 g/dL (ref 0.0–0.4)
Alpha2 Glob SerPl Elph-Mcnc: 1 g/dL (ref 0.4–1.0)
B-Globulin SerPl Elph-Mcnc: 0.9 g/dL (ref 0.7–1.3)
Gamma Glob SerPl Elph-Mcnc: 0.9 g/dL (ref 0.4–1.8)
Globulin, Total: 3 g/dL (ref 2.2–3.9)
IgA: 276 mg/dL (ref 64–422)
IgG (Immunoglobin G), Serum: 1043 mg/dL (ref 586–1602)
IgM (Immunoglobulin M), Srm: 69 mg/dL (ref 26–217)
Total Protein ELP: 6.5 g/dL (ref 6.0–8.5)

## 2023-06-06 LAB — POCT GLYCOSYLATED HEMOGLOBIN (HGB A1C): Hemoglobin A1C: 5.8 % — AB (ref 4.0–5.6)

## 2023-06-06 LAB — METHYLMALONIC ACID, SERUM: Methylmalonic Acid, Quantitative: 292 nmol/L (ref 0–378)

## 2023-06-06 MED ORDER — TRAMADOL HCL 50 MG PO TABS: 25.0000 mg | ORAL_TABLET | Freq: Three times a day (TID) | ORAL | 0 refills | Status: AC | PRN

## 2023-06-06 MED ORDER — HYDROXYZINE HCL 25 MG PO TABS: 25.0000 mg | ORAL_TABLET | Freq: Three times a day (TID) | ORAL | 3 refills | Status: DC

## 2023-06-06 MED ORDER — OXYBUTYNIN CHLORIDE ER 10 MG PO TB24: 10.0000 mg | ORAL_TABLET | Freq: Every day | ORAL | 3 refills | Status: DC

## 2023-06-06 MED ORDER — CEFIXIME 400 MG PO CAPS: 400.0000 mg | ORAL_CAPSULE | Freq: Every day | ORAL | 0 refills | Status: DC

## 2023-06-06 NOTE — Patient Instructions (Signed)
 It was very nice to see you today!  Please start the antibiotic.  You can use low-dose tramadol as needed.  Let us know if you have any side effects with this.  Will call you once we get results back from your urine culture.  I will refill your other medications.    Please monitor your blood pressure at home and let us know if it is persistently elevated.  Return if symptoms worsen or fail to improve.   Take care, Dr [name]  PLEASE NOTE:  If you had any lab tests, please let us know if you have not heard back within a few days. You may see your results on mychart before we have a chance to review them but we will give you a call once they are reviewed by [place].   If we ordered any referrals today, please let us know if you have not heard from their office within the next week.   If you had any urgent prescriptions sent in today, please check with the pharmacy within an hour of our visit to make sure the prescription was transmitted appropriately.   Please try these tips to maintain a healthy lifestyle:  Eat at least 3 REAL meals and 1-2 snacks per day.  Aim for no more than 5 hours between eating.  If you eat breakfast, please do so within one hour of getting up.   Each meal should contain half fruits/vegetables, one quarter protein, and one quarter carbs (no bigger than a computer mouse)  Cut down on sweet beverages. This includes juice, soda, and sweet tea.   Drink at least 1 glass of water with each meal and aim for at least 8 glasses per day  Exercise at least 150 minutes every week.

## 2023-06-06 NOTE — Assessment & Plan Note (Signed)
 Following with urogynecology.  She is on oxybutynin.  Will refill this today for 90-day supply.  Defer further management to her gynecology.

## 2023-06-06 NOTE — Progress Notes (Signed)
   Kristy Trujillo is a 73 y.o. female who presents today for an office visit.  Assessment/Plan:  New/Acute Problems: Urinary tract infection UA and history consistent with UTI.  No signs of systemic infection.  She did not have much treatment with Macrobid a month ago and symptoms have worsened the last few days.  Her last urine culture showed E. coli sensitive to 2nd and 3rd generation cephalosporins.  Will start Suprax while we await her current culture results.  Encouraged hydration.  May need referral back to urology if symptoms do not improve with antibiotics as above.  She will need to come back in 4 to 6 weeks to repeat UA due to her hematuria.  Chronic Problems Addressed Today: OAB (overactive bladder) Following with urogynecology.  She is on oxybutynin.  Will refill this today for 90-day supply.  Defer further management to her gynecology.  Diabetes mellitus without complication (HCC) At goal today at 5.8 without meds.  Congratulated patient on glycemic control.  Recheck in 3 to 6 months.  Hypertension associated with diabetes (HCC) Elevated today.  Typically well-controlled.  She will monitor at home and let us know if persistently elevated.  Continue HCTZ 25 mg daily.     Subjective:  HPI:  See A/P for status of chronic conditions.  Patient is here for follow-up.  I last saw her about a month ago via virtual visit.  At that time she was having ongoing iss6ues with UTI and overactive bladder.  Empirically sent in Macrobid.  This did help with symptoms however she never had complete resolution.  Over the last few days she has had worsening dysuria.  Also more frequency and urgency.  No fevers or chills.  No back pain.  She has tried Azo without much improvement.       Objective:  Physical Exam: BP (!) 150/81   Pulse 79   Temp (!) 97.3 F (36.3 C) (Temporal)   Ht 5\' 2"  (1.575 m)   Wt 145 lb 12.8 oz (66.1 kg)   SpO2 100%   BMI 26.67 kg/m   Gen: No acute distress, resting  comfortably CV: Regular rate and rhythm with no murmurs appreciated Pulm: Normal work of breathing, clear to auscultation bilaterally with no crackles, wheezes, or rhonchi Neuro: Grossly normal, moves all extremities Psych: Normal affect and thought content      Terreon Ekholm M. [name], MD 06/06/2023 9:54 AM

## 2023-06-06 NOTE — Assessment & Plan Note (Signed)
 At goal today at 5.8 without meds.  Congratulated patient on glycemic control.  Recheck in 3 to 6 months.

## 2023-06-06 NOTE — Assessment & Plan Note (Signed)
 Elevated today.  Typically well-controlled.  She will monitor at home and let us know if persistently elevated.  Continue HCTZ 25 mg daily.

## 2023-06-08 LAB — URINE CULTURE
MICRO NUMBER:: 16308688
SPECIMEN QUALITY:: ADEQUATE

## 2023-06-11 ENCOUNTER — Telehealth: Payer: Self-pay | Admitting: *Deleted

## 2023-06-11 NOTE — Telephone Encounter (Signed)
 Received call from pt inquiring about her lab results from 06/04/23     Please advise

## 2023-06-11 NOTE — Telephone Encounter (Signed)
 Copied from CRM [id number]. Topic: Clinical - Lab/Test Results >> Jun 11, 2023 10:14 AM Crispin Dolphin wrote: Reason for CRM: Patient called about lab results. No note showing from provider to relay results to patient. There is already a note that patient called but I was unable to leave a note on the encounter. Thank you.   Spoke with patient, notified PCP will be out till Monday  He will check his inbasket, once he sign lab I will give her a call back  Verbalized understanding  Kate Sweetman,RMA

## 2023-06-12 ENCOUNTER — Encounter: Payer: Self-pay | Admitting: Family Medicine

## 2023-06-12 NOTE — Telephone Encounter (Signed)
 Please let Kristy Trujillo know that we did not see any concerning abnormalities with her blood work.  At this time I do suspect that her low hemoglobin is due to her kidney dysfunction.  Please assure that she has been able to schedule with a nephrologist.  At this time I do not think there is any need for routine follow-up in our clinic as long as she is under the care of a nephrologist.

## 2023-06-12 NOTE — Progress Notes (Signed)
 Urine culture confirms urinary tract infection. The antibiotic we have her on should treat this. She should let us  know if her symptoms are not improving.  [name]. [name], MD 06/12/2023 7:10 PM

## 2023-06-13 ENCOUNTER — Telehealth: Payer: Self-pay | Admitting: *Deleted

## 2023-06-13 NOTE — Telephone Encounter (Signed)
 TCT patient regarding her recent lab results. Spoke to her. Advised hat we did not see any concerning abnormalities with her blood work.  At this time I do suspect that her low hemoglobin is due to her kidney dysfunction.    At this time, there is no need for routine follow-up in our clinic as long as she is under the care of a nephrologist.  Pt  states that she does see a nephrologist. Advised to continue to see her nephrologist. Pt voiced understanding.

## 2023-06-19 ENCOUNTER — Telehealth (INDEPENDENT_AMBULATORY_CARE_PROVIDER_SITE_OTHER): Admitting: Family Medicine

## 2023-06-19 ENCOUNTER — Telehealth: Payer: Self-pay | Admitting: *Deleted

## 2023-06-19 DIAGNOSIS — D649 Anemia, unspecified: Secondary | ICD-10-CM | POA: Diagnosis not present

## 2023-06-19 DIAGNOSIS — N3281 Overactive bladder: Secondary | ICD-10-CM | POA: Diagnosis not present

## 2023-06-19 DIAGNOSIS — N184 Chronic kidney disease, stage 4 (severe): Secondary | ICD-10-CM

## 2023-06-19 MED ORDER — CEFIXIME 400 MG PO CAPS: 400.0000 mg | ORAL_CAPSULE | Freq: Every day | ORAL | 0 refills | Status: DC

## 2023-06-19 NOTE — Telephone Encounter (Signed)
 Patient had a VV with PCP today

## 2023-06-19 NOTE — Assessment & Plan Note (Signed)
 Following with urogynecology.  She is on oxybutynin  and doing well though is having a few issues with recurrent UTI as above.

## 2023-06-19 NOTE — Telephone Encounter (Signed)
 Called pt back to schedule appt and pt denied appt because Dr [name] does not have anything until Friday. Pt demanded for Dr [name] to call her back ASAP. Please advise.

## 2023-06-19 NOTE — Assessment & Plan Note (Signed)
 Saw oncology and had reassuring workup.  It was thought that this was likely due to her CKD.  Will be placing referral to nephrology.

## 2023-06-19 NOTE — Assessment & Plan Note (Signed)
 Most recent GFR is 19.  Will refer to nephrology as above.  This referral was placed a couple of months ago however she has not yet heard back from them.

## 2023-06-19 NOTE — Telephone Encounter (Signed)
 Patient requesting a VV with PCP to discuss lab results and referral  Please schedule

## 2023-06-19 NOTE — Progress Notes (Signed)
   Kristy Trujillo is a 73 y.o. female who presents today for a virtual office visit.  Assessment/Plan:  New/Acute Problems: Dysuria Culture from a couple weeks ago was positive.  No red flags.  She did have some benefit with Suprax  since her last visit howeverthe symptoms do seem to be returning.  Will send in another 7-day supply for Suprax .  If this does not improve with this round of antibiotics she will need to come here for in person office visit or follow-up with urology..   Chronic Problems Addressed Today: Normocytic anemia Saw oncology and had reassuring workup.  It was thought that this was likely due to her CKD.  Will be placing referral to nephrology.  CKD (chronic kidney disease) stage 4, GFR 15-29 ml/min (HCC) Most recent GFR is 19.  Will refer to nephrology as above.  This referral was placed a couple of months ago however she has not yet heard back from them.  OAB (overactive bladder) Following with urogynecology.  She is on oxybutynin  and doing well though is having a few issues with recurrent UTI as above.     Subjective:  HPI:  See Assessment / plan for status of chronic conditions.  Patient here for follow up.  I last saw her a couple weeks ago.  Since her last visit she has followed with hematology for normocytic anemia.  Had workup there was negative.  It was thought that her anemia was likely due to CKD and she was recommended to follow-up with nephrology.  She would like to have that referral placed today.  She is also still having ongoing issues with dysuria.  We treated her with Suprax  at her visit here.  She did well with this however has had a recurrence of symptoms the last few days.  No fevers or chills.  No nausea or vomiting.       Objective/Observations  Physical Exam: Gen: NAD, resting comfortably Pulm: Normal work of breathing Neuro: Grossly normal, moves all extremities Psych: Normal affect and thought content  Virtual Visit via Video   I  connected with Kristy Trujillo on 06/19/23 at 11:40 AM EDT by a video enabled telemedicine application and verified that I am speaking with the correct person using two identifiers. The limitations of evaluation and management by telemedicine and the availability of in person appointments were discussed. The patient expressed understanding and agreed to proceed.   Patient location: Home Provider location: [place] [employer] Persons participating in the virtual visit: Myself and Patient     Kristy Trujillo. [name], MD 06/19/2023 12:46 PM

## 2023-06-19 NOTE — Telephone Encounter (Signed)
 Pt would like for Dr [name]

## 2023-06-27 ENCOUNTER — Telehealth: Payer: Self-pay | Admitting: *Deleted

## 2023-06-27 NOTE — Telephone Encounter (Signed)
 Copied from CRM [id number]. Topic: Referral - Question >> Jun 27, 2023  8:16 AM Kristy Trujillo wrote: Reason for CRM: PT Calling to speak with nurse regarding Referral for Dr, [name] , pt will like call back   Spoke with patient, stated has not received a call form nephrology office for her appt  Send information to [facility], referral coordinator  [facility]

## 2023-06-28 ENCOUNTER — Encounter: Payer: Self-pay | Admitting: Family Medicine

## 2023-07-03 DIAGNOSIS — R809 Proteinuria, unspecified: Secondary | ICD-10-CM | POA: Diagnosis not present

## 2023-07-03 DIAGNOSIS — N179 Acute kidney failure, unspecified: Secondary | ICD-10-CM | POA: Diagnosis not present

## 2023-07-03 DIAGNOSIS — R82998 Other abnormal findings in urine: Secondary | ICD-10-CM | POA: Diagnosis not present

## 2023-07-03 DIAGNOSIS — E1122 Type 2 diabetes mellitus with diabetic chronic kidney disease: Secondary | ICD-10-CM | POA: Diagnosis not present

## 2023-07-03 DIAGNOSIS — I129 Hypertensive chronic kidney disease with stage 1 through stage 4 chronic kidney disease, or unspecified chronic kidney disease: Secondary | ICD-10-CM | POA: Diagnosis not present

## 2023-07-03 DIAGNOSIS — N184 Chronic kidney disease, stage 4 (severe): Secondary | ICD-10-CM | POA: Diagnosis not present

## 2023-07-03 DIAGNOSIS — N390 Urinary tract infection, site not specified: Secondary | ICD-10-CM | POA: Diagnosis not present

## 2023-07-03 DIAGNOSIS — E1129 Type 2 diabetes mellitus with other diabetic kidney complication: Secondary | ICD-10-CM | POA: Diagnosis not present

## 2023-07-03 DIAGNOSIS — E875 Hyperkalemia: Secondary | ICD-10-CM | POA: Diagnosis not present

## 2023-07-04 ENCOUNTER — Other Ambulatory Visit: Payer: Self-pay | Admitting: *Deleted

## 2023-07-04 ENCOUNTER — Ambulatory Visit: Payer: Self-pay | Admitting: Family Medicine

## 2023-07-04 ENCOUNTER — Other Ambulatory Visit: Payer: Self-pay | Admitting: Nephrology

## 2023-07-04 DIAGNOSIS — N184 Chronic kidney disease, stage 4 (severe): Secondary | ICD-10-CM

## 2023-07-04 DIAGNOSIS — N179 Acute kidney failure, unspecified: Secondary | ICD-10-CM

## 2023-07-04 MED ORDER — TRIAMCINOLONE ACETONIDE 0.5 % EX CREA: 1.0000 | TOPICAL_CREAM | Freq: Three times a day (TID) | CUTANEOUS | 0 refills | Status: DC

## 2023-07-04 NOTE — Telephone Encounter (Signed)
Rx send to pharmacy  °Patient aware  °

## 2023-07-04 NOTE — Telephone Encounter (Signed)
 Chief Complaint: Rash on face came back a couple of days ago  Symptoms: Redness on entire face Pertinent Negatives: Patient denies pain, itching, swelling, hives or bumps  Disposition:  [x]  Follow-up with PCP  Additional Notes: Patient states Dr. [name] prescribed her a prescription cream for her face breaking out. Pt states she used it all and the break out went away. Pt is now out of the cream and wants a refill sent in today. This RN will send a high priority message to clinic.    Copied from CRM [id number]. Topic: Clinical - Red Word Triage >> Jul 04, 2023 10:37 AM Elita Guitar wrote: Red Word that prompted transfer to Nurse Triage: face breaking out Reason for Disposition  Mild localized rash  Answer Assessment - Initial Assessment Questions Chief Complaint: Rash on face came back a couple of days ago  Symptoms: Redness on entire face  Pertinent Negatives: Patient denies pain, itching, swelling, hives or bumps  Protocols used: Rash or Redness - Localized-A-AH

## 2023-07-04 NOTE — Telephone Encounter (Signed)
 It is okay to refill her triamcinolone  if that is what she is referring to however recommend office visit here if not improving.

## 2023-07-05 ENCOUNTER — Telehealth: Payer: Self-pay | Admitting: Family Medicine

## 2023-07-05 NOTE — Telephone Encounter (Signed)
 Pt called again and states she was told by Dr [name] to wear a heart monitor but she never received the monitor or received any messages about the next step to getting a heart monitor. Please advise.

## 2023-07-05 NOTE — Telephone Encounter (Signed)
 Patient states since she has been off of the antibiotics her UTI is acting up again and that her potassium levels are high and was given medication to bring Potassium levels down from Nephrologist. Patient declined to schedule Office Visit.

## 2023-07-05 NOTE — Telephone Encounter (Signed)
**Note De-identified  [place] Obfuscation** Please advise 

## 2023-07-05 NOTE — Telephone Encounter (Signed)
 Recommend she follow up with urology soon if she still having ongoing UTI issues.

## 2023-07-05 NOTE — Telephone Encounter (Signed)
 Patient notified, Dr [name] recommended to follow up with urology  Verbalized understanding

## 2023-07-06 NOTE — Telephone Encounter (Signed)
 Patient aware need to call Dr Katheryne Pane 's office for information

## 2023-07-06 NOTE — Telephone Encounter (Signed)
 Looks like this was ordered by Dr Arlester Ladd office.  [name]. [name], MD 07/06/2023 9:14 AM

## 2023-07-06 NOTE — Telephone Encounter (Signed)
**Note De-identified  [place] Obfuscation** Please advise 

## 2023-07-09 ENCOUNTER — Ambulatory Visit
Admission: RE | Admit: 2023-07-09 | Discharge: 2023-07-09 | Disposition: A | Discharge status: Home / Self Care | Source: Ambulatory Visit | Attending: Nephrology

## 2023-07-09 DIAGNOSIS — N184 Chronic kidney disease, stage 4 (severe): Secondary | ICD-10-CM

## 2023-07-09 DIAGNOSIS — N179 Acute kidney failure, unspecified: Secondary | ICD-10-CM

## 2023-07-11 DIAGNOSIS — N184 Chronic kidney disease, stage 4 (severe): Secondary | ICD-10-CM | POA: Diagnosis not present

## 2023-07-11 DIAGNOSIS — E875 Hyperkalemia: Secondary | ICD-10-CM | POA: Diagnosis not present

## 2023-07-24 ENCOUNTER — Telehealth: Payer: Self-pay | Admitting: *Deleted

## 2023-07-24 NOTE — Telephone Encounter (Signed)
 Copied from CRM [id number]. Topic: Clinical - Medical Advice >> Jul 24, 2023  1:36 PM Carlatta H wrote: Reason for CRM: Patient has had an uti for 2 days and would like to speak with the nurse//   Spoke with patient, advise to schedule an office visit with PCP Appt schedule on 07/26/2023 with PCP [facility]

## 2023-07-26 ENCOUNTER — Encounter: Payer: Self-pay | Admitting: Family Medicine

## 2023-07-26 ENCOUNTER — Ambulatory Visit (INDEPENDENT_AMBULATORY_CARE_PROVIDER_SITE_OTHER): Admitting: Family Medicine

## 2023-07-26 VITALS — BP 180/75 | HR 76 | Temp 97.3°F | Ht 62.0 in | Wt 153.8 lb

## 2023-07-26 DIAGNOSIS — I152 Hypertension secondary to endocrine disorders: Secondary | ICD-10-CM | POA: Diagnosis not present

## 2023-07-26 DIAGNOSIS — E1159 Type 2 diabetes mellitus with other circulatory complications: Secondary | ICD-10-CM

## 2023-07-26 DIAGNOSIS — N390 Urinary tract infection, site not specified: Secondary | ICD-10-CM | POA: Diagnosis not present

## 2023-07-26 DIAGNOSIS — N184 Chronic kidney disease, stage 4 (severe): Secondary | ICD-10-CM

## 2023-07-26 LAB — POCT URINALYSIS DIPSTICK
Bilirubin, UA: NEGATIVE
Blood, UA: NEGATIVE
Glucose, UA: NEGATIVE
Ketones, UA: NEGATIVE
Nitrite, UA: POSITIVE
Protein, UA: POSITIVE — AB
Spec Grav, UA: 1.02 (ref 1.010–1.025)
Urobilinogen, UA: 0.2 U/dL
pH, UA: 6 (ref 5.0–8.0)

## 2023-07-26 MED ORDER — PHENAZOPYRIDINE HCL 100 MG PO TABS: 100.0000 mg | ORAL_TABLET | Freq: Three times a day (TID) | ORAL | 0 refills | Status: DC | PRN

## 2023-07-26 MED ORDER — HYDROCHLOROTHIAZIDE 12.5 MG PO TABS: 12.5000 mg | ORAL_TABLET | Freq: Every day | ORAL | 3 refills | Status: DC

## 2023-07-26 MED ORDER — CIPROFLOXACIN HCL 500 MG PO TABS: 500.0000 mg | ORAL_TABLET | Freq: Every day | ORAL | 0 refills | Status: DC

## 2023-07-26 NOTE — Assessment & Plan Note (Signed)
 She has established with nephrology since our last visit.  Labs on 07/11/2023 via [facility] showed GFR of 27

## 2023-07-26 NOTE — Patient Instructions (Signed)
 It was very nice to see you today!  Please start the Cipro  and Pyridium  for your UTI.  We will restart HCTZ 12.5 mg daily.  Please come back to see me next week.  Bring all of your medications at your next visit with us .  Return in about 1 week (around 08/02/2023) for Follow Up.   Take care, Dr [name]  PLEASE NOTE:  If you had any lab tests, please let us  know if you have not heard back within a few days. You may see your results on mychart before we have a chance to review them but we will give you a call once they are reviewed by us .   If we ordered any referrals today, please let us  know if you have not heard from their office within the next week.   If you had any urgent prescriptions sent in today, please check with the pharmacy within an hour of our visit to make sure the prescription was transmitted appropriately.   Please try these tips to maintain a healthy lifestyle:  Eat at least 3 REAL meals and 1-2 snacks per day.  Aim for no more than 5 hours between eating.  If you eat breakfast, please do so within one hour of getting up.   Each meal should contain half fruits/vegetables, one quarter protein, and one quarter carbs (no bigger than a computer mouse)  Cut down on sweet beverages. This includes juice, soda, and sweet tea.   Drink at least 1 glass of water with each meal and aim for at least 8 glasses per day  Exercise at least 150 minutes every week.

## 2023-07-26 NOTE — Progress Notes (Signed)
   Kristy Trujillo is a 73 y.o. female who presents today for an office visit.  Assessment/Plan:  New/Acute Problems: UTI UA and history consistent with recurrent UTI.  She is having occasional chills but otherwise no red flag signs or symptoms or signs of systemic illness.  Will empirically start renally dosed ciprofloxacin  500 mg daily while we await culture results.  She will need to follow-up with urology soon to discuss management plan for her recurrent UTIs.  We discussed reasons to return to care and seek emergent care.  Chronic Problems Addressed Today: Hypertension associated with diabetes (HCC) Elevated today though she has been off of her blood pressure medication for the last few weeks.  Apparently nephrology sent a prescription in for amlodipine however patient does not think that she is taking this.  She is still having quite a bit of swelling in her lower extremities and would like to restart HCTZ.  Will go back with a lower dose 12.5 mg daily.  She will follow-up with us  next week.  We can check labs at her visit here next week.  May benefit from starting ACE or ARB at some point in the future due to her CKD but will defer this to nephrology.  CKD (chronic kidney disease) stage 4, GFR 15-29 ml/min (HCC) She has established with nephrology since our last visit.  Labs on 07/11/2023 via [facility] showed GFR of 27     Subjective:  HPI:  See Assessment / plan for status of chronic conditions.  Patient here today with concern for UTI.  Symptoms started several days ago.  Consistent with previous UTIs.  She is having a lot of dysuria.  Also increased frequency and urgency as well.  She is having occasional chills.  No fevers.  No nausea or vomiting.  No back pain.  I last saw her about a month ago via virtual visit for dysuria as well and we empirically sent in Suprax .  Symptoms did improve after this.  Since our last visit she has been referred to see nephrology for CKD stage IV.  She  did have some issues with hyperkalemia which resolved on repeat labs.  There was concern for AKI and nephrology and they asked her to hold her HCTZ temporarily.  Apparently they also prescribed amlodipine however patient is not sure if she has started that.  She has not been checking her blood pressure at home.  She has noticed more swelling in her legs.        Objective:  Physical Exam: BP (!) 180/75   Pulse 76   Temp (!) 97.3 F (36.3 C) (Temporal)   Ht 5\' 2"  (1.575 m)   Wt 153 lb 12.8 oz (69.8 kg)   SpO2 100%   BMI 28.13 kg/m   Wt Readings from Last 3 Encounters:  07/26/23 153 lb 12.8 oz (69.8 kg)  06/06/23 145 lb 12.8 oz (66.1 kg)  06/04/23 147 lb 9.6 oz (67 kg)    Gen: No acute distress, resting comfortably CV: Regular rate and rhythm with no murmurs appreciated Pulm: Normal work of breathing, clear to auscultation bilaterally with no crackles, wheezes, or rhonchi MUSCULOSKELETAL: 1+ pitting edema to midshin bilaterally Neuro: Grossly normal, moves all extremities Psych: Normal affect and thought content      Dawana Asper M. [name], MD 07/26/2023 12:30 PM

## 2023-07-26 NOTE — Assessment & Plan Note (Signed)
 Elevated today though she has been off of her blood pressure medication for the last few weeks.  Apparently nephrology sent a prescription in for amlodipine however patient does not think that she is taking this.  She is still having quite a bit of swelling in her lower extremities and would like to restart HCTZ.  Will go back with a lower dose 12.5 mg daily.  She will follow-up with us  next week.  We can check labs at her visit here next week.  May benefit from starting ACE or ARB at some point in the future due to her CKD but will defer this to nephrology.

## 2023-07-27 ENCOUNTER — Other Ambulatory Visit: Payer: Self-pay | Admitting: Family Medicine

## 2023-07-27 ENCOUNTER — Ambulatory Visit: Payer: Self-pay

## 2023-07-27 MED ORDER — TRAMADOL HCL 50 MG PO TABS: 50.0000 mg | ORAL_TABLET | Freq: Three times a day (TID) | ORAL | 0 refills | Status: DC | PRN

## 2023-07-27 MED ORDER — TRAMADOL HCL 50 MG PO TABS: 50.0000 mg | ORAL_TABLET | Freq: Three times a day (TID) | ORAL | 0 refills | Status: AC | PRN

## 2023-07-27 NOTE — Telephone Encounter (Signed)
  Chief Complaint: Unable to swallow Cipro , pyridium  ineffective (severe pain all night) Symptoms: back pain when walking Frequency: 1 month Pertinent Negatives: Patient denies fever or flank pain Disposition: [] ED /[] Urgent Care (no appt availability in office) / [] Appointment(In office/virtual)/ []  [facility]/ [] Home Care/ [] Refused Recommended Disposition /[] [facility]/ [x]  Follow-up with PCP Additional Notes: please advise pt  Copied from CRM [id number]. Topic: Clinical - Red Word Triage >> Jul 27, 2023  8:20 AM Kristy Trujillo wrote: Red Word that prompted transfer to Nurse Triage: severe pain from uti, ongoing Reason for Disposition  [1] SEVERE pain with urination (e.g., excruciating) AND [2] not improved after 2 hours of pain medicine and Sitz bath  Answer Assessment - Initial Assessment Questions 1. SEVERITY: "How bad is the pain?"  (e.g., Scale 1-10; mild, moderate, or severe)   - MILD (1-3): complains slightly about urination hurting   - MODERATE (4-7): interferes with normal activities     - SEVERE (8-10): excruciating, unwilling or unable to urinate because of the pain      Severe did not sleep at all  2. FREQUENCY: "How many times have you had painful urination today?"       3. PATTERN: "Is pain present every time you urinate or just sometimes?"      All the time 4. ONSET: "When did the painful urination start?"      1 month 5. FEVER: "Do you have a fever?" If Yes, ask: "What is your temperature, how was it measured, and when did it start?"     no 6. PAST UTI: "Have you had a urine infection before?" If Yes, ask: "When was the last time?" and "What happened that time?"      yes 7. CAUSE: "What do you think is causing the painful urination?"  (e.g., UTI, scratch, Herpes sore)     UTI 8. OTHER SYMPTOMS: "Do you have any other symptoms?" (e.g., blood in urine, flank pain, genital sores, urgency, vaginal discharge)     Walking back pain  9. PREGNANCY: "Is  there any chance you are pregnant?" "When was your last menstrual period?"     N/a  Protocols used: Urination Pain - Female-A-AH

## 2023-07-27 NOTE — Addendum Note (Signed)
 Addended by: Havish Petties M on: 07/27/2023 12:44 PM   Modules accepted: Orders

## 2023-07-27 NOTE — Telephone Encounter (Signed)
 Prescription for tramadol  sent in.  [name]. [name], MD 07/27/2023 12:45 PM

## 2023-07-27 NOTE — Telephone Encounter (Signed)
 See note

## 2023-07-27 NOTE — Telephone Encounter (Signed)
 She took the same medication a few weeks ago. Did something change where she cannot swallow pills? We can send in a liquid forumlation though if she is having dysphagia then we need refer her to gastroenterology.

## 2023-07-27 NOTE — Telephone Encounter (Signed)
 We sent in Pyridium  and an antibiotic yesterday. Did she get started on these?  [name]. [name], MD 07/27/2023 9:25 AM

## 2023-07-27 NOTE — Telephone Encounter (Signed)
 Spoke with patient stated peridium pills are too big unable to swallow  Patient requesting Rx Tramadol  if is possible pain is increasing due to UTI

## 2023-07-29 LAB — URINE CULTURE
MICRO NUMBER:: 16514393
SPECIMEN QUALITY:: ADEQUATE

## 2023-07-30 ENCOUNTER — Ambulatory Visit: Payer: Self-pay | Admitting: Family Medicine

## 2023-07-30 NOTE — Progress Notes (Signed)
 Urine culture confirms UTI.  The antibiotic we have her on should treat this.  She should let us  know if not improving.

## 2023-08-01 ENCOUNTER — Ambulatory Visit (INDEPENDENT_AMBULATORY_CARE_PROVIDER_SITE_OTHER): Admitting: Family Medicine

## 2023-08-01 ENCOUNTER — Encounter: Payer: Self-pay | Admitting: Family Medicine

## 2023-08-01 VITALS — BP 168/79 | HR 84 | Temp 97.3°F | Ht 62.0 in | Wt 152.8 lb

## 2023-08-01 DIAGNOSIS — I152 Hypertension secondary to endocrine disorders: Secondary | ICD-10-CM | POA: Diagnosis not present

## 2023-08-01 DIAGNOSIS — N390 Urinary tract infection, site not specified: Secondary | ICD-10-CM | POA: Diagnosis not present

## 2023-08-01 DIAGNOSIS — N184 Chronic kidney disease, stage 4 (severe): Secondary | ICD-10-CM

## 2023-08-01 DIAGNOSIS — E1159 Type 2 diabetes mellitus with other circulatory complications: Secondary | ICD-10-CM

## 2023-08-01 MED ORDER — BLOOD PRESSURE KIT: PACK | 0 refills | Status: DC

## 2023-08-01 NOTE — Assessment & Plan Note (Addendum)
 Elevated today though improving compared to last week when we restarted her HCTZ 12.5 mg daily.  She has not been monitoring at home.  She has been consistent with her HCTZ 12.5 mg daily and amlodipine 5 mg daily.  Advised her to monitor at home for the next couple of weeks and follow-up with us .  She also has upcoming appointment with nephrology later this month.  She would likely benefit from starting ACE or ARB at some point in the future though will defer this to nephrology.

## 2023-08-01 NOTE — Assessment & Plan Note (Signed)
 GFR stable on recent labs.  We discussed the importance of maintaining good blood pressure control.  She will follow-up with nephrology later this month

## 2023-08-01 NOTE — Patient Instructions (Signed)
 It was very nice to see you today!  I am glad that you are feeling better.  Monitor your blood pressure at home and let us  know how this is looking in a few weeks  Return in about 3 months (around 11/01/2023) for Follow Up.   Take care, Dr [name]  PLEASE NOTE:  If you had any lab tests, please let us  know if you have not heard back within a few days. You may see your results on mychart before we have a chance to review them but we will give you a call once they are reviewed by us .   If we ordered any referrals today, please let us  know if you have not heard from their office within the next week.   If you had any urgent prescriptions sent in today, please check with the pharmacy within an hour of our visit to make sure the prescription was transmitted appropriately.   Please try these tips to maintain a healthy lifestyle:  Eat at least 3 REAL meals and 1-2 snacks per day.  Aim for no more than 5 hours between eating.  If you eat breakfast, please do so within one hour of getting up.   Each meal should contain half fruits/vegetables, one quarter protein, and one quarter carbs (no bigger than a computer mouse)  Cut down on sweet beverages. This includes juice, soda, and sweet tea.   Drink at least 1 glass of water with each meal and aim for at least 8 glasses per day  Exercise at least 150 minutes every week.

## 2023-08-01 NOTE — Progress Notes (Signed)
   Kristy Trujillo is a 73 y.o. female who presents today for an office visit.  Assessment/Plan:  New/Acute Problems: Urinary tract infection Symptoms have resolved with ciprofloxacin . She will let us  know if she has any recurrence of symptoms.  Chronic Problems Addressed Today: Hypertension associated with diabetes (HCC) Elevated today though improving compared to last week when we restarted her HCTZ 12.5 mg daily.  She has not been monitoring at home.  She has been consistent with her HCTZ 12.5 mg daily and amlodipine 5 mg daily.  Advised her to monitor at home for the next couple of weeks and follow-up with us .  She also has upcoming appointment with nephrology later this month.  She would likely benefit from starting ACE or ARB at some point in the future though will defer this to nephrology.  CKD (chronic kidney disease) stage 4, GFR 15-29 ml/min (HCC) GFR stable on recent labs.  We discussed the importance of maintaining good blood pressure control.  She will follow-up with nephrology later this month     Subjective:  HPI:  See A/P for status of chronic conditions.  Patient is here today for follow-up.  I saw her a week ago for UTI.  Was started on ciprofloxacin  for this.  Her urine culture came back positive for Enterobacter.  At her visit here last week we also restarted HCTZ 12.5 mg daily due to lower extremity swelling and uncontrolled hypertension.  Her urinary symptoms have improved significantly.  She has had improvement in her lower extremity swelling as well.       Objective:  Physical Exam: BP (!) 168/79   Pulse 84   Temp (!) 97.3 F (36.3 C) (Temporal)   Ht 5\' 2"  (1.575 m)   Wt 152 lb 12.8 oz (69.3 kg)   SpO2 99%   BMI 27.95 kg/m   Wt Readings from Last 3 Encounters:  08/01/23 152 lb 12.8 oz (69.3 kg)  07/26/23 153 lb 12.8 oz (69.8 kg)  06/06/23 145 lb 12.8 oz (66.1 kg)    Gen: No acute distress, resting comfortably CV: Regular rate and rhythm with no  murmurs appreciated Pulm: Normal work of breathing, clear to auscultation bilaterally with no crackles, wheezes, or rhonchi Neuro: Grossly normal, moves all extremities Psych: Normal affect and thought content      Philip Eckersley M. [name], MD 08/01/2023 10:20 AM

## 2023-08-02 ENCOUNTER — Telehealth: Payer: Self-pay | Admitting: *Deleted

## 2023-08-02 ENCOUNTER — Ambulatory Visit: Payer: Self-pay

## 2023-08-02 ENCOUNTER — Telehealth: Payer: Self-pay | Admitting: Family Medicine

## 2023-08-02 NOTE — Telephone Encounter (Signed)
 Copied from CRM [id number]. Topic: Clinical - Medication Question >> Aug 02, 2023  3:16 PM Adonis Hoot wrote: Reason for CRM: Patient called in stating that provider was suppose to sent in medication for her blood pressure at her visit yesterday with him.She went to pharmacy and they did to have prescription for her.   Please advise  Jon Lall,RMA

## 2023-08-02 NOTE — Telephone Encounter (Signed)
 Left message to return call to our office at their convenience.

## 2023-08-02 NOTE — Telephone Encounter (Unsigned)
 Copied from CRM [id number]. Topic: Clinical - Medication Refill >> Aug 02, 2023  4:03 PM Alyse July wrote: Medication: amLODipine (NORVASC) 5 MG tablet   Has the patient contacted their pharmacy? Yes   This is the patient's preferred pharmacy:  [facility] [address] [place] - [address] [place] [address] Phone: [phone] Fax: [phone]  Is this the correct pharmacy for this prescription? Yes If no, delete pharmacy and type the correct one.   Has the prescription been filled recently? No  Is the patient out of the medication? No  Has the patient been seen for an appointment in the last year OR does the patient have an upcoming appointment? Yes  Can we respond through MyChart? No  Agent: Please be advised that Rx refills may take up to 3 business days. We ask that you follow-up with your pharmacy.

## 2023-08-02 NOTE — Telephone Encounter (Signed)
 FYI Only or Action Required?: Action required by provider  Patient was last seen in primary care on 08/01/2023 by Rodney Clamp, MD. Called Nurse Triage reporting Urinary Frequency. Symptoms began today. Interventions attempted: Nothing. Symptoms are: gradually worsening.  Triage Disposition: See PCP Within 2 Weeks  Patient/caregiver understands and will follow disposition?: No, wishes to speak with PCP see triage documentation  Copied from CRM [id number]. Topic: Clinical - Medication Question >> Aug 02, 2023  4:04 PM Alyse July wrote: Reason for CRM: Patient would like to know if another script can be sent into her pharmacy for phenazopyridine  (PYRIDIUM ) 100 MG tablet ciprofloxacin  (CIPRO ) 500 MG tablet Patient states all medication previous prescribed are gone and symptoms have returned when she thought they were previously gone. >> Aug 02, 2023  4:08 PM Alyse July wrote: Patient is currently having phone issues and would like to be contact on her sister phone  [phone] Reason for Disposition  All other urine symptoms  Answer Assessment - Initial Assessment Questions 1. SYMPTOM: "What's the main symptom you're concerned about?" (e.g., frequency, incontinence)     dysuria 2. ONSET: "When did the  dysuria  start?"     today 3. PAIN: "Is there any pain?" If Yes, ask: "How bad is it?" (Scale: 1-10; mild, moderate, severe)     Intermittent back pain while ambulating 4. CAUSE: "What do you think is causing the symptoms?"     uti 5. OTHER SYMPTOMS: "Do you have any other symptoms?" (e.g., blood in urine, fever, flank pain, pain with urination)     Denies  Additional information: Patient declines follow up office visit, just in office yesterday. Completed course of Cipro  rx on 07/26/23. Requesting refill for cipro  and pyridium  for recurring uti sx. Family has called multiple times today please review all encounters from 08/02/23. Also needing amlodipine refill.  Protocols used: Urinary  Symptoms-A-AH

## 2023-08-02 NOTE — Telephone Encounter (Signed)
       FYI Only or Action Required?: Action required by provider  Patient was last seen in primary care on 08/01/2023 by Rodney Clamp, MD. Called Nurse Triage reporting No chief complaint on file.. Symptoms began yesterday. Interventions attempted: Nothing. Symptoms are: unchanged.  Triage Disposition: See Physician Within 24 Hours  Patient/caregiver understands and will follow disposition?: No, wishes to speak with PCP, called [facility] and pt transferred to [facility] as she was requesting to speak with Bernell Brigham.         Copied from CRM [id number]. Topic: Clinical - Red Word Triage >> Aug 02, 2023  1:54 PM Luane Rumps D wrote: Red Word that prompted transfer to Nurse Triage: Patient calling off sisters phone: [phone] Abe Abed: UTI Symptoms came up today, burning/pain before urinating. Was triaged 5/29 for the same issue. Reason for Disposition  All other patients with painful urination  (Exception: [1] EITHER frequency or urgency AND [2] has on-call doctor.)  Answer Assessment - Initial Assessment Questions 1. SEVERITY: "How bad is the pain?"  (e.g., Scale 1-10; mild, moderate, or severe)   - MILD (1-3): complains slightly about urination hurting   - MODERATE (4-7): interferes with normal activities     - SEVERE (8-10): excruciating, unwilling or unable to urinate because of the pain      5/10  2. FREQUENCY: "How many times have you had painful urination today?"      multiple 3. PATTERN: "Is pain present every time you urinate or just sometimes?"      Every times 4. ONSET: "When did the painful urination start?"         Started back yesterday 5. FEVER: "Do you have a fever?" If Yes, ask: "What is your temperature, how was it measured, and when did it start?"     no 6. PAST UTI: "Have you had a urine infection before?" If Yes, ask: "When was the last time?" and "What happened that time?"      Yes a couple weeks ago 7. CAUSE: "What do you think is causing the painful urination?"  (e.g., UTI,  scratch, Herpes sore)     Uti 8. OTHER SYMPTOMS: "Do you have any other symptoms?" (e.g., blood in urine, flank pain, genital sores, urgency, vaginal discharge)     no  Protocols used: Urination Pain - Female-A-AH

## 2023-08-03 ENCOUNTER — Other Ambulatory Visit: Payer: Self-pay

## 2023-08-03 ENCOUNTER — Other Ambulatory Visit: Payer: Self-pay | Admitting: *Deleted

## 2023-08-03 ENCOUNTER — Encounter: Payer: Self-pay | Admitting: Family Medicine

## 2023-08-03 MED ORDER — AMLODIPINE BESYLATE 5 MG PO TABS: 5.0000 mg | ORAL_TABLET | Freq: Every day | ORAL | 0 refills | Status: DC

## 2023-08-03 MED ORDER — CIPROFLOXACIN HCL 500 MG PO TABS: 500.0000 mg | ORAL_TABLET | Freq: Every day | ORAL | 0 refills | Status: DC

## 2023-08-03 MED ORDER — PHENAZOPYRIDINE HCL 100 MG PO TABS: 100.0000 mg | ORAL_TABLET | Freq: Three times a day (TID) | ORAL | 0 refills | Status: DC | PRN

## 2023-08-03 NOTE — Telephone Encounter (Signed)
 Can we clarify? She is supposed to be on HCTZ 12.5 mg daily and amlodipine 5 mg daily.  These were already prescribed to her at her most recent visit and we did not make any medication changes at that time.

## 2023-08-03 NOTE — Telephone Encounter (Signed)
 Please return call to pt at [phone].

## 2023-08-03 NOTE — Telephone Encounter (Signed)
 Spoke with patient notified  No changes on her medicine last visit  She is supposed to be on HCTZ 12.5 mg daily and amlodipine 5 mg daily.

## 2023-08-03 NOTE — Telephone Encounter (Signed)
 Refilled pt prescription according to OV note 08/01/23

## 2023-08-03 NOTE — Telephone Encounter (Signed)
 Left message to return call to our office at their convenience.

## 2023-08-03 NOTE — Telephone Encounter (Signed)
 Ok to send in refills however recommend office visit here if symptoms are still persisting.  [name]. [name], MD 08/03/2023 7:48 AM

## 2023-08-03 NOTE — Telephone Encounter (Signed)
 Rx send to pharmacy  Left message to return call to our office at their convenience.

## 2023-08-03 NOTE — Telephone Encounter (Signed)
Please see triage note and advise.  ?

## 2023-08-06 ENCOUNTER — Other Ambulatory Visit: Payer: Self-pay | Admitting: *Deleted

## 2023-08-06 MED ORDER — AMLODIPINE BESYLATE 5 MG PO TABS: 5.0000 mg | ORAL_TABLET | Freq: Every day | ORAL | 0 refills | Status: DC

## 2023-08-06 NOTE — Telephone Encounter (Signed)
 Rx resend to [facility] pharmacy

## 2023-08-27 DIAGNOSIS — I129 Hypertensive chronic kidney disease with stage 1 through stage 4 chronic kidney disease, or unspecified chronic kidney disease: Secondary | ICD-10-CM | POA: Diagnosis not present

## 2023-08-27 DIAGNOSIS — N184 Chronic kidney disease, stage 4 (severe): Secondary | ICD-10-CM | POA: Diagnosis not present

## 2023-08-27 DIAGNOSIS — E875 Hyperkalemia: Secondary | ICD-10-CM | POA: Diagnosis not present

## 2023-08-27 DIAGNOSIS — N179 Acute kidney failure, unspecified: Secondary | ICD-10-CM | POA: Diagnosis not present

## 2023-08-27 DIAGNOSIS — E1129 Type 2 diabetes mellitus with other diabetic kidney complication: Secondary | ICD-10-CM | POA: Diagnosis not present

## 2023-08-27 DIAGNOSIS — R82998 Other abnormal findings in urine: Secondary | ICD-10-CM | POA: Diagnosis not present

## 2023-08-27 DIAGNOSIS — R809 Proteinuria, unspecified: Secondary | ICD-10-CM | POA: Diagnosis not present

## 2023-08-27 DIAGNOSIS — E1122 Type 2 diabetes mellitus with diabetic chronic kidney disease: Secondary | ICD-10-CM | POA: Diagnosis not present

## 2023-08-28 LAB — LAB REPORT - SCANNED
Albumin, Urine POC: 516.8
Creatinine, POC: 53.7 mg/dL
EGFR: 25
Microalb Creat Ratio: 962

## 2023-09-10 ENCOUNTER — Telehealth: Payer: Self-pay | Admitting: Family Medicine

## 2023-09-10 NOTE — Telephone Encounter (Signed)
 Patient requesting an appt with PCP due to UTI Appt schedule

## 2023-09-10 NOTE — Telephone Encounter (Signed)
 Copied from CRM [id number]. Topic: Clinical - Medical Advice >> Sep 10, 2023 12:15 PM Kristy Trujillo wrote: Reason for CRM: Patient is calling in stating she has a UTI, and would like the doctor to call in some medication, patient is also asking if she can get on some type of medication so it does not come back, as she gets them a lot.    Paitent has an OV with PCP tomorrow 09/11/2023 Kristy Trujillo

## 2023-09-10 NOTE — Telephone Encounter (Signed)
 Copied from CRM [id number]. Topic: General - Other >> Sep 10, 2023 10:36 AM Gennette ORN wrote: Reason for CRM: Patient is calling back she doesn't want to state the issue of her call informed she wants a call back from [facility] concerning medical advice.

## 2023-09-11 ENCOUNTER — Ambulatory Visit: Admitting: Family Medicine

## 2023-09-11 ENCOUNTER — Encounter: Payer: Self-pay | Admitting: Family Medicine

## 2023-09-11 VITALS — BP 173/78 | HR 60 | Temp 97.3°F | Ht 62.0 in | Wt 151.0 lb

## 2023-09-11 DIAGNOSIS — E1159 Type 2 diabetes mellitus with other circulatory complications: Secondary | ICD-10-CM

## 2023-09-11 DIAGNOSIS — N390 Urinary tract infection, site not specified: Secondary | ICD-10-CM

## 2023-09-11 DIAGNOSIS — I152 Hypertension secondary to endocrine disorders: Secondary | ICD-10-CM | POA: Diagnosis not present

## 2023-09-11 MED ORDER — TRIMETHOPRIM 100 MG PO TABS: 100.0000 mg | ORAL_TABLET | Freq: Every day | ORAL | 0 refills | Status: AC

## 2023-09-11 MED ORDER — CIPROFLOXACIN HCL 500 MG PO TABS: 500.0000 mg | ORAL_TABLET | Freq: Every day | ORAL | 0 refills | Status: DC

## 2023-09-11 NOTE — Progress Notes (Signed)
   Kristy Trujillo is a 73 y.o. female who presents today for an office visit.  Assessment/Plan:  Chronic Problems Addressed Today: Recurrent UTI History consistent with recurrent UTI.  She has had several UTIs this year.  Previously had follows with urology with this however has had difficulty with maintaining follow-up appointments with them.  We did attempt to obtain a UA and urine culture today however she was not able to provide a specimen while in the office and we will attempt to bring the specimen back later this afternoon or tomorrow if she is able to.  Based on her last urine culture results from about 6 weeks ago, we will start ciprofloxacin  renally dosed at 500 mg daily.  Given her numerous recurrences over the last year or so she is interested in starting prophylactic antibiotics at this point.  As above we did discuss having her follow-up with urology to manage this however we will start trimethoprim  100 mg daily for the next few months as prophylaxis.  Her last culture was resistant to first generation cephalosporins and Macrobid .  We did encourage hydration.  We discussed reasons to return to care.  She will need to follow back up with urology if no improvement with above.  Hypertension associated with diabetes (HCC) Elevated today.  Nephrology is managing her antihypertensives.  She is now on HCTZ 12.5 mg daily, amlodipine  5 mg daily, and Coreg 6.25 mg twice daily.  She will monitor at home and follow-up with us  or nephrology if persistently elevated.     Subjective:  HPI:  Patient here with urinary tract infection symptoms including dysyria and spasms. No fevers or chills. Feels similar to previous urinary tract infection.  She was treated for UTI several weeks ago.  Symptoms improved with ciprofloxacin .       Objective:  Physical Exam: BP (!) 173/78   Pulse 60   Temp (!) 97.3 F (36.3 C) (Temporal)   Ht 5' 2 (1.575 m)   Wt 151 lb (68.5 kg)   SpO2 100%   BMI 27.62  kg/m   Gen: No acute distress, resting comfortably CV: Regular rate and rhythm with no murmurs appreciated Pulm: Normal work of breathing, clear to auscultation bilaterally with no crackles, wheezes, or rhonchi Neuro: Grossly normal, moves all extremities Psych: Normal affect and thought content      Rahn Lacuesta M. Kennyth, MD 09/11/2023 2:02 PM

## 2023-09-11 NOTE — Assessment & Plan Note (Signed)
 Elevated today.  Nephrology is managing her antihypertensives.  She is now on HCTZ 12.5 mg daily, amlodipine  5 mg daily, and Coreg 6.25 mg twice daily.  She will monitor at home and follow-up with us  or nephrology if persistently elevated.

## 2023-09-11 NOTE — Patient Instructions (Addendum)
 It was very nice to see you today!  Please start the ciprofloxacin .  Start the trimethoprim  after he finishes ciprofloxacin .  Let us  know if you have any recurrence of symptoms.  Return if symptoms worsen or fail to improve.   Take care, Dr Kennyth  PLEASE NOTE:  If you had any lab tests, please let us  know if you have not heard back within a few days. You may see your results on mychart before we have a chance to review them but we will give you a call once they are reviewed by us .   If we ordered any referrals today, please let us  know if you have not heard from their office within the next week.   If you had any urgent prescriptions sent in today, please check with the pharmacy within an hour of our visit to make sure the prescription was transmitted appropriately.   Please try these tips to maintain a healthy lifestyle:  Eat at least 3 REAL meals and 1-2 snacks per day.  Aim for no more than 5 hours between eating.  If you eat breakfast, please do so within one hour of getting up.   Each meal should contain half fruits/vegetables, one quarter protein, and one quarter carbs (no bigger than a computer mouse)  Cut down on sweet beverages. This includes juice, soda, and sweet tea.   Drink at least 1 glass of water with each meal and aim for at least 8 glasses per day  Exercise at least 150 minutes every week.

## 2023-09-11 NOTE — Assessment & Plan Note (Signed)
 History consistent with recurrent UTI.  She has had several UTIs this year.  Previously had follows with urology with this however has had difficulty with maintaining follow-up appointments with them.  We did attempt to obtain a UA and urine culture today however she was not able to provide a specimen while in the office and we will attempt to bring the specimen back later this afternoon or tomorrow if she is able to.  Based on her last urine culture results from about 6 weeks ago, we will start ciprofloxacin  renally dosed at 500 mg daily.  Given her numerous recurrences over the last year or so she is interested in starting prophylactic antibiotics at this point.  As above we did discuss having her follow-up with urology to manage this however we will start trimethoprim  100 mg daily for the next few months as prophylaxis.  Her last culture was resistant to first generation cephalosporins and Macrobid .  We did encourage hydration.  We discussed reasons to return to care.  She will need to follow back up with urology if no improvement with above.

## 2023-09-12 ENCOUNTER — Ambulatory Visit: Payer: Self-pay

## 2023-09-12 ENCOUNTER — Telehealth: Payer: Self-pay | Admitting: Family Medicine

## 2023-09-12 NOTE — Telephone Encounter (Signed)
 Please see previous CRM. This is a duplicate message. PCP notified.    Copied from CRM [id number]. Topic: Clinical - Red Word Triage >> Sep 12, 2023 10:22 AM Burnard DEL wrote: Red Word that prompted transfer to Nurse Triage: swelling in legs >> Sep 12, 2023  1:27 PM Thersia C wrote: Patient called in stated that the nurse that calls back needs to call her sister phone at [phone], as he phone isnt letting her pick up calls

## 2023-09-12 NOTE — Telephone Encounter (Signed)
 FYI patient called back and states for staff to use her mobile number for contact, [phone].

## 2023-09-12 NOTE — Telephone Encounter (Signed)
 Attempted to schedule appointment. Patient refused, requested message be sent to Dr. Kennyth to ask if she can continue the hydrochlorothiazide  (her nephrologist discontinued it, but she cannot recall when). Denies higher acuity questions, advised to call [phone] if she experiences any difficulty breathing.  FYI Only or Action Required?: Action required by provider: clinical question for provider.  Patient was last seen in primary care on 09/11/2023 by Kennyth Worth HERO, MD.  Called Nurse Triage reporting Leg Swelling.  Symptoms began yesterday.  Interventions attempted: Prescription medications: hydrochlorothiazide  that was d/c'd.  Symptoms are: unchanged.  Triage Disposition: See HCP Within 4 Hours (Or PCP Triage)  Patient/caregiver understands and will follow disposition?: No, wishes to speak with PCP  Copied from CRM [id number]. Topic: Clinical - Red Word Triage >> Sep 12, 2023 10:22 AM Burnard DEL wrote: Red Word that prompted transfer to Nurse Triage: swelling in legs Reason for Disposition  SEVERE leg swelling (e.g., swelling extends above knee, entire leg is swollen, weeping fluid)  Answer Assessment - Initial Assessment Questions Patient states she noticed pitting leg edema yesterday. She states that she was recently taken off of hydrochlorothiazide  by her nephrologist but cannot clarify how long ago. States that she took one yesterday when she noticed the edema, legs are still swollen. Denies higher acuity questions. Refused appointment, requesting staff as PCP about restarting hydrochlorothiazide .   1. ONSET: When did the swelling start? (e.g., minutes, hours, days)     09/11/23 2. LOCATION: What part of the leg is swollen?  Are both legs swollen or just one leg?     Entire lower legs to feet 3. SEVERITY: How bad is the swelling? (e.g., localized; mild, moderate, severe)     Severe 4. REDNESS: Is there redness or signs of infection?     Mild 8. MEDICAL HISTORY: Do you have a  history of blood clots (e.g., DVT), cancer, heart failure, kidney disease, or liver failure?     Kidney disease, HTN 9. RECURRENT SYMPTOM: Have you had leg swelling before? If Yes, ask: When was the last time? What happened that time?     Yes 10. OTHER SYMPTOMS: Do you have any other symptoms? (e.g., chest pain, difficulty breathing)       No  Protocols used: Leg Swelling and Edema-A-AH

## 2023-09-12 NOTE — Telephone Encounter (Signed)
 See note

## 2023-09-12 NOTE — Telephone Encounter (Signed)
 Patient called in requesting Elora to call her back on her mobile number [phone]. She states she called earlier and left a message stating for the office to call her sister's number but she states that her sisters phone ringer was not working so she would like the office to call her number. Patient states she is asking if Elora will talk to Dr Kennyth about her medication question. She states it is the same message she called about earlier and that she saw Dr Kennyth yesterday. No new or worsening symptoms.

## 2023-09-13 NOTE — Telephone Encounter (Signed)
 I thought that the hydrochlorothiazide  was restarted at her last office visit here?  If the nephrologist recommended that she discontinue then it would be a good idea for her to ask them what other options she has for managing her lower extremity edema.

## 2023-09-13 NOTE — Telephone Encounter (Signed)
,  spoke with patient, stated took hydrochlorothiazide  leg swelling when down a bit  Called Nephrologist to let them know about medication and symptoms and still waiting for the return call  Pt will return to giveus a urine sample

## 2023-10-31 ENCOUNTER — Telehealth: Payer: Self-pay | Admitting: *Deleted

## 2023-10-31 NOTE — Telephone Encounter (Signed)
 Copied from CRM [id number]. Topic: Clinical - Medication Question >> Oct 31, 2023  2:35 PM Thersia C wrote: Reason for CRM: Patient called in regarding refill a medication for urinating , patient didn't know the name of the medication, I informed patient about the 3 day timeframe of refilling medication, patient stated she is out of medication and needs it asap, patient also stated she would like for Elora to give her a callback as soon as possible  Spoke with patient requesting Rx oxybutynin  Advise to call pharmacy for refills has 2 refill left  Verbalized understanding  Sunnie Odden,RMA

## 2023-11-11 ENCOUNTER — Other Ambulatory Visit: Payer: Self-pay | Admitting: Family Medicine

## 2023-11-21 ENCOUNTER — Encounter: Payer: Self-pay | Admitting: Family Medicine

## 2023-11-21 ENCOUNTER — Ambulatory Visit (INDEPENDENT_AMBULATORY_CARE_PROVIDER_SITE_OTHER): Admitting: Family Medicine

## 2023-11-21 VITALS — BP 184/71 | HR 69 | Temp 97.8°F | Ht 62.0 in | Wt 146.6 lb

## 2023-11-21 DIAGNOSIS — R21 Rash and other nonspecific skin eruption: Secondary | ICD-10-CM | POA: Diagnosis not present

## 2023-11-21 DIAGNOSIS — I152 Hypertension secondary to endocrine disorders: Secondary | ICD-10-CM | POA: Diagnosis not present

## 2023-11-21 DIAGNOSIS — M25572 Pain in left ankle and joints of left foot: Secondary | ICD-10-CM | POA: Diagnosis not present

## 2023-11-21 DIAGNOSIS — N184 Chronic kidney disease, stage 4 (severe): Secondary | ICD-10-CM

## 2023-11-21 DIAGNOSIS — R300 Dysuria: Secondary | ICD-10-CM | POA: Diagnosis not present

## 2023-11-21 DIAGNOSIS — E1159 Type 2 diabetes mellitus with other circulatory complications: Secondary | ICD-10-CM

## 2023-11-21 DIAGNOSIS — E2839 Other primary ovarian failure: Secondary | ICD-10-CM | POA: Diagnosis not present

## 2023-11-21 LAB — URINALYSIS, ROUTINE W REFLEX MICROSCOPIC
Bilirubin Urine: NEGATIVE
Ketones, ur: NEGATIVE
Nitrite: POSITIVE — AB
Specific Gravity, Urine: 1.01 (ref 1.000–1.030)
Total Protein, Urine: 100 — AB
Urine Glucose: NEGATIVE
Urobilinogen, UA: 0.2 (ref 0.0–1.0)
pH: 6 (ref 5.0–8.0)

## 2023-11-21 MED ORDER — HYDROCHLOROTHIAZIDE 12.5 MG PO TABS: 12.5000 mg | ORAL_TABLET | Freq: Two times a day (BID) | ORAL | 3 refills | Status: DC

## 2023-11-21 MED ORDER — TRIAMCINOLONE ACETONIDE 0.5 % EX CREA: 1.0000 | TOPICAL_CREAM | Freq: Three times a day (TID) | CUTANEOUS | 0 refills | Status: AC

## 2023-11-21 MED ORDER — PREDNISONE 20 MG PO TABS: 20.0000 mg | ORAL_TABLET | Freq: Every day | ORAL | 0 refills | Status: DC

## 2023-11-21 NOTE — Assessment & Plan Note (Signed)
 Elevated today.  She has not been able to check her blood pressure at home.  She is currently on HCTZ 12.5 mg daily, amlodipine  5 mg daily, and Coreg 6.25 mg twice daily.  She would like to increase her HCTZ to 12.5 mg twice daily if she has done all this in the past.  Will send a new prescription in for this.  She will come back in a couple weeks for repeat labs and blood work.  If still elevated at that time would consider increasing her dose of Coreg.

## 2023-11-21 NOTE — Progress Notes (Signed)
 Kristy Trujillo is a 73 y.o. female who presents today for an office visit.  Assessment/Plan:  New/Acute Problems: Rash No red flags.  Likely has xerosis cutis which is contributing though may have mild allergic dermatitis as well.  Will start topical triamcinolone .  She has done well with this in the past.  Also start prednisone  burst for this as well as her left ankle sprain as below.  She will let us  know if not improving in the next 1 to 2 weeks and would consider biopsy versus referral to dermatology at that time.  Left Ankle Pain Consistent with ankle sprain.  We discussed conservative measures.  Will be starting prednisone  20 mg daily for 7 days.  She has done well with this in the past.  We did discuss imaging however she would like to hold off on this for now.  She will let us  know if not improving in the next 1 to 2 weeks and would consider imaging versus referral at that time.  Dysuria No red flags.  No signs of systemic illness.  Will check UA and urine culture today per patient request though she does have known history of chronic UTI and is currently on trimethoprim  for this.  Chronic Problems Addressed Today: Hypertension associated with diabetes (HCC) Elevated today.  She has not been able to check her blood pressure at home.  She is currently on HCTZ 12.5 mg daily, amlodipine  5 mg daily, and Coreg 6.25 mg twice daily.  She would like to increase her HCTZ to 12.5 mg twice daily if she has done all this in the past.  Will send a new prescription in for this.  She will come back in a couple weeks for repeat labs and blood work.  If still elevated at that time would consider increasing her dose of Coreg.  CKD (chronic kidney disease) stage 4, GFR 15-29 ml/min (HCC) Follow-up nephrology.  Will check labs when she comes back in a couple of weeks.     Subjective:  HPI:  See assessment / plan for status of chronic conditions.  Patient is here today with rash.  Symptoms started 5  days ago.  Initially located on abdomen however is now having rash on extremities and torso.  No obvious precipitating events.  No new exposures.  No new lotion, soaps, or detergents.  No new medication or over-the-counter supplements.  Symptoms seem to be worsening.  Area is very itchy and pruritic.  She has never had anything like this in the past.  She tried calamine lotion without much improvement.  No other treatments tried.  A few days ago patient also injured her left ankle while walking on uneven gravel.  She twisted her ankle and fell on her left side.  She did have some pain and bruising in the area but this does seem to be improving.  Currently has some mild pain with walking.  No specific treatments tried for this.        Objective:  Physical Exam: BP (!) 184/71   Pulse 69   Temp 97.8 F (36.6 C) (Temporal)   Ht 5' 2 (1.575 m)   Wt 146 lb 9.6 oz (66.5 kg)   SpO2 100%   BMI 26.81 kg/m   Gen: No acute distress, resting comfortably Skin: Scattered diffuse erythematous excoriations noted on extremities and torso.  Xerosis cutis noted. MUSCULOSKELETAL: Left ankle with ecchymosis.  Nontender to palpation at posterior malleoli and midfoot.  Pain with inversion.  Neurovascular  intact distally. Neuro: Grossly normal, moves all extremities Psych: Normal affect and thought content      Akshat Minehart M. Kennyth, MD 11/21/2023 9:11 AM

## 2023-11-21 NOTE — Patient Instructions (Signed)
 It was very nice to see you today!  Please start the triamcinolone  and prednisone .  Let us  know if your rash and ankle are not improving in the next 1 to 2 weeks.  We will increase your HCTZ to 12.5 mg twice daily.  Please come back in a couple weeks to recheck blood pressure and blood work.  Monitor your blood pressure at home and let us  know if persistently elevated.  We will check a urine sample today.  Return in about 2 weeks (around 12/05/2023).   Take care, Dr Kennyth  PLEASE NOTE:  If you had any lab tests, please let us  know if you have not heard back within a few days. You may see your results on mychart before we have a chance to review them but we will give you a call once they are reviewed by us .   If we ordered any referrals today, please let us  know if you have not heard from their office within the next week.   If you had any urgent prescriptions sent in today, please check with the pharmacy within an hour of our visit to make sure the prescription was transmitted appropriately.   Please try these tips to maintain a healthy lifestyle:  Eat at least 3 REAL meals and 1-2 snacks per day.  Aim for no more than 5 hours between eating.  If you eat breakfast, please do so within one hour of getting up.   Each meal should contain half fruits/vegetables, one quarter protein, and one quarter carbs (no bigger than a computer mouse)  Cut down on sweet beverages. This includes juice, soda, and sweet tea.   Drink at least 1 glass of water with each meal and aim for at least 8 glasses per day  Exercise at least 150 minutes every week.

## 2023-11-21 NOTE — Assessment & Plan Note (Signed)
 Follow-up nephrology.  Will check labs when she comes back in a couple of weeks.

## 2023-11-23 ENCOUNTER — Ambulatory Visit: Payer: Self-pay | Admitting: *Deleted

## 2023-11-23 ENCOUNTER — Other Ambulatory Visit: Payer: Self-pay | Admitting: Family Medicine

## 2023-11-23 NOTE — Telephone Encounter (Signed)
 FYI Only or Action Required?: Action required by provider: medication refill request.  Patient was last seen in primary care on 11/21/2023 by Kennyth Worth HERO, MD.  Called Nurse Triage reporting Hypertension and Medication Problem.  Symptoms began unsure.  Interventions attempted: Prescription medications: Patient takes hydrochlorothiazide  and amlodipine .    Triage Disposition: Call PCP Within 24 Hours, Call PCP Now  Patient/caregiver understands and will follow disposition?: Yes   Reason for Disposition  [1] Prescription refill request for ESSENTIAL medicine (i.e., likelihood of harm to patient if not taken) AND [2] triager unable to refill per department policy  Ran out of BP medications    Patient is missing a BP medication on her list- has not been taking it- office called and note forwarded.  Answer Assessment - Initial Assessment Questions 1. DRUG NAME: What medicine do you need to have refilled?     carvedilol (COREG) 6.25 MG tablet- patient is not taking presently and does not remember taking it.  2. REFILLS REMAINING: How many refills are remaining? Notes: The label on the medicine or pill bottle will show how many refills are remaining. If there are no refills remaining, then a renewal may be needed.     none  4. PRESCRIBER: Who prescribed it? Note: The prescribing doctor or group is responsible for refill approvals..     Listed as historical 5. PHARMACY: Have you contacted your pharmacy (drugstore)? Note: Some pharmacies will contact the doctor (or NP/PA).      [facility] 6. SYMPTOMS: Do you have any symptoms?     Pressure in ears  Patient is concerned that her BP is elevated- but does not have a way to check it and states she can not afford a home BP cuff. Patient advised please been seen for symptoms-chest pain, SOB, vision changes, unsteady on feet - she states she will not go to UC- will await provider response.  Answer Assessment -  Initial Assessment Questions 1. BLOOD PRESSURE: What is your blood pressure? Did you take at least two measurements 5 minutes apart?     unknown  3. HOW: How did you take your blood pressure? (e.g., automatic home BP monitor, visiting nurse)     *No Answer* 4. HISTORY: Do you have a history of high blood pressure?     yes 5. MEDICINES: Are you taking any medicines for blood pressure? Have you missed any doses recently?     Patient reports there is a medication on her list that her provider may think she is taking- but she is not- Coreg 6. OTHER SYMPTOMS: Do you have any symptoms? (e.g., blurred vision, chest pain, difficulty breathing, headache, weakness)     Ear pressure  Protocols used: Blood Pressure - High-A-AH, Medication Refill and Renewal Call-A-AH

## 2023-11-23 NOTE — Telephone Encounter (Signed)
 Patient called stating she never got her coreg 6.25 mg sent to pharmacy following OV on 11/21/23. Patient stated she is experiencing HTN sx and can hear blood rushing in her ears/pressure. Patient has been transferred to triage.

## 2023-11-25 LAB — URINE CULTURE
MICRO NUMBER:: 17011245
SPECIMEN QUALITY:: ADEQUATE

## 2023-11-26 ENCOUNTER — Telehealth: Payer: Self-pay

## 2023-11-26 ENCOUNTER — Ambulatory Visit: Payer: Self-pay | Admitting: Family Medicine

## 2023-11-26 ENCOUNTER — Other Ambulatory Visit: Payer: Self-pay

## 2023-11-26 MED ORDER — CIPROFLOXACIN HCL 250 MG PO TABS
250.0000 mg | ORAL_TABLET | Freq: Two times a day (BID) | ORAL | 0 refills | Status: AC
Start: 2023-11-26 — End: 2023-11-29

## 2023-11-26 MED ORDER — CARVEDILOL 6.25 MG PO TABS: 6.2500 mg | ORAL_TABLET | Freq: Two times a day (BID) | ORAL | 3 refills | Status: DC

## 2023-11-26 NOTE — Telephone Encounter (Signed)
 See note regarding medication pt reports has not been taking and advise on refill or recommendations. Pt scheduled 12/04/23 with PCP

## 2023-11-26 NOTE — Telephone Encounter (Signed)
 Copied from CRM #[id number]. Topic: Clinical - Medication Question >> Nov 26, 2023 12:26 PM Franky GRADE wrote: Reason for CRM: Patient is calling because Dr.Parker will sent her a message that he will prescribed ciprofloxacin  250 mg twice daily x 3 days; however, she contacted the pharmacy and they had not received the prescription as of yet. She would like a phone call once the medication has been submitted to the pharmacy.  After reviewing lab results form this morning, seeing PCP requested nurse to send ciprofloxacin  250 mg twice daily x 3 days; Rx sent to pharmacy on file for patient.

## 2023-11-26 NOTE — Telephone Encounter (Signed)
 Please send in prescription for her Coreg and have her follow-up with us  in a week or 2.

## 2023-11-26 NOTE — Telephone Encounter (Signed)
Last refill done by historical provider  

## 2023-11-26 NOTE — Progress Notes (Signed)
 Urine culture was positive for Pseudomonas.  Please send in ciprofloxacin  250 mg twice daily x 3 days.  She needs to follow-up with urology if not improving.

## 2023-11-26 NOTE — Telephone Encounter (Signed)
 Patient notified Rx send to pharmacy  Has an OV in two weeks with PCP

## 2023-11-26 NOTE — Telephone Encounter (Signed)
 Copied from CRM [id number]. Topic: Clinical - Medical Advice >> Nov 23, 2023  3:16 PM Burnard DEL wrote: Reason for CRM: Patient was advised to let provider know if medication for UTI is working. Patient  called in stating  that the medication is not working she is experiencing more pain in her urinary tract system and the frequent urination. She stated that she would like to have the antibiotic changed.  New Rx sent to pharmacy per PCP request after reviewing lab results this morning. Nothing further needed at this time.

## 2023-11-28 ENCOUNTER — Other Ambulatory Visit: Payer: Self-pay | Admitting: *Deleted

## 2023-12-04 ENCOUNTER — Encounter: Payer: Self-pay | Admitting: Family Medicine

## 2023-12-04 ENCOUNTER — Ambulatory Visit (INDEPENDENT_AMBULATORY_CARE_PROVIDER_SITE_OTHER): Admitting: Family Medicine

## 2023-12-04 VITALS — BP 126/64 | HR 66 | Temp 97.2°F | Ht 62.0 in | Wt 143.4 lb

## 2023-12-04 DIAGNOSIS — S93402D Sprain of unspecified ligament of left ankle, subsequent encounter: Secondary | ICD-10-CM

## 2023-12-04 DIAGNOSIS — R21 Rash and other nonspecific skin eruption: Secondary | ICD-10-CM

## 2023-12-04 DIAGNOSIS — I152 Hypertension secondary to endocrine disorders: Secondary | ICD-10-CM | POA: Diagnosis not present

## 2023-12-04 DIAGNOSIS — N184 Chronic kidney disease, stage 4 (severe): Secondary | ICD-10-CM | POA: Diagnosis not present

## 2023-12-04 DIAGNOSIS — E1159 Type 2 diabetes mellitus with other circulatory complications: Secondary | ICD-10-CM

## 2023-12-04 LAB — BASIC METABOLIC PANEL WITH GFR
BUN: 50 mg/dL — ABNORMAL HIGH (ref 6–23)
CO2: 26 meq/L (ref 19–32)
Calcium: 9 mg/dL (ref 8.4–10.5)
Chloride: 98 meq/L (ref 96–112)
Creatinine, Ser: 2.27 mg/dL — ABNORMAL HIGH (ref 0.40–1.20)
GFR: 20.77 mL/min — ABNORMAL LOW (ref >60.00)
Glucose, Bld: 158 mg/dL — ABNORMAL HIGH (ref 70–99)
Potassium: 4.9 meq/L (ref 3.5–5.1)
Sodium: 132 meq/L — ABNORMAL LOW (ref 135–145)

## 2023-12-04 NOTE — Assessment & Plan Note (Signed)
 Blood pressure at goal today on HCTZ 12.5 mg twice daily, amlodipine  5 mg daily, and Coreg 6.25 mg twice daily.  Check labs today.  She will monitor at home and let us  know if persistently elevated.

## 2023-12-04 NOTE — Patient Instructions (Addendum)
 It was very nice to see you today!  VISIT SUMMARY: Today, we discussed the improvement of your rash and ankle pain, as well as your blood pressure and kidney health.  YOUR PLAN: RASH: Your rash has significantly improved, with only a small area remaining on your back. -Apply triamcinolone  cream to the remaining rash on your back.  CHRONIC KIDNEY DISEASE: We need to monitor your kidney function since you have not followed up with your kidney doctor. -We will order blood work to check your kidney function and sodium levels.  Return in about 6 months (around 06/03/2024) for Follow Up.   Take care, Dr Kennyth  PLEASE NOTE:  If you had any lab tests, please let us  know if you have not heard back within a few days. You may see your results on mychart before we have a chance to review them but we will give you a call once they are reviewed by us .   If we ordered any referrals today, please let us  know if you have not heard from their office within the next week.   If you had any urgent prescriptions sent in today, please check with the pharmacy within an hour of our visit to make sure the prescription was transmitted appropriately.   Please try these tips to maintain a healthy lifestyle:  Eat at least 3 REAL meals and 1-2 snacks per day.  Aim for no more than 5 hours between eating.  If you eat breakfast, please do so within one hour of getting up.   Each meal should contain half fruits/vegetables, one quarter protein, and one quarter carbs (no bigger than a computer mouse)  Cut down on sweet beverages. This includes juice, soda, and sweet tea.   Drink at least 1 glass of water with each meal and aim for at least 8 glasses per day  Exercise at least 150 minutes every week.

## 2023-12-04 NOTE — Progress Notes (Signed)
   Kristy Trujillo is a 73 y.o. female who presents today for an office visit.  Assessment/Plan:  New/Acute Problems: Rash Rash is essentially resolved since starting prednisone  2 weeks ago.  She does have a small area on her left upper back.  She can use topical triamcinolone  to the area.  She will let us  know if not improving.  Left Ankle Pain  Pain has improved significantly prednisone .  She will let us  know if symptoms do not continue to improve.  Chronic Problems Addressed Today: Hypertension associated with diabetes (HCC) Blood pressure at goal today on HCTZ 12.5 mg twice daily, amlodipine  5 mg daily, and Coreg 6.25 mg twice daily.  Check labs today.  She will monitor at home and let us  know if persistently elevated.  CKD (chronic kidney disease) stage 4, GFR 15-29 ml/min (HCC) Check BMET     Subjective:  HPI:  See assessment / plan for status of chronic conditions.   Discussed the use of AI scribe software for clinical note transcription with the patient, who gave verbal consent to proceed.  History of Present Illness Kristy Trujillo is a 73 year old female who presents for follow-up of her rash and ankle pain.  The patient reports that the rash, previously treated with prednisone , is almost gone except for one spot remaining on her back. She has not yet applied triamcinolone  cream to this area. Initially, the rash consisted of multiple spots, but they were not numerous.  Her ankle pain, which was a concern during her last visit a couple of weeks ago, is not as bad as it was previously. She describes the pain as not as severe as it was previously.  She reports that her blood pressure is pretty good and she thinks the higher dose of hydrochlorothiazide  has brought it down. She has not visited her kidney doctor since the summer and does not plan to return, expressing dissatisfaction with the care received.          Objective:  Physical Exam: BP 126/64   Pulse 66    Temp (!) 97.2 F (36.2 C) (Temporal)   Ht 5' 2 (1.575 m)   Wt 143 lb 6.4 oz (65 kg)   SpO2 98%   BMI 26.23 kg/m   Gen: No acute distress, resting comfortably CV: Regular rate and rhythm with no murmurs appreciated Pulm: Normal work of breathing, clear to auscultation bilaterally with no crackles, wheezes, or rhonchi Skin: A few scattered excoriated lesions on left upper back. Neuro: Grossly normal, moves all extremities Psych: Normal affect and thought content      Malissa Slay M. Kennyth, MD 12/04/2023 11:46 AM

## 2023-12-04 NOTE — Assessment & Plan Note (Addendum)
Check BMET 

## 2023-12-05 ENCOUNTER — Ambulatory Visit: Payer: Self-pay | Admitting: Family Medicine

## 2023-12-05 ENCOUNTER — Encounter: Payer: Self-pay | Admitting: Family Medicine

## 2023-12-05 DIAGNOSIS — N289 Disorder of kidney and ureter, unspecified: Secondary | ICD-10-CM

## 2023-12-05 NOTE — Progress Notes (Signed)
 Her kidney function is about the same however her sodium did drop a little bit.  This may be due to her HCTZ.  I would like for her to come back in a week or 2 to recheck her BMet to make sure her sodium levels are stable.  If her sodium levels are still low then we will have to switch her off of the HCTZ

## 2023-12-06 NOTE — Telephone Encounter (Signed)
 Spoke with patient, stated has refills at the pharmacy, will refill Rx today  No need of refills at this time

## 2023-12-18 ENCOUNTER — Other Ambulatory Visit

## 2023-12-18 DIAGNOSIS — N289 Disorder of kidney and ureter, unspecified: Secondary | ICD-10-CM | POA: Diagnosis not present

## 2023-12-18 LAB — BASIC METABOLIC PANEL WITH GFR
BUN: 49 mg/dL — ABNORMAL HIGH (ref 6–23)
CO2: 25 meq/L (ref 19–32)
Calcium: 8.9 mg/dL (ref 8.4–10.5)
Chloride: 107 meq/L (ref 96–112)
Creatinine, Ser: 2.11 mg/dL — ABNORMAL HIGH (ref 0.40–1.20)
GFR: 22.67 mL/min — ABNORMAL LOW (ref >60.00)
Glucose, Bld: 135 mg/dL — ABNORMAL HIGH (ref 70–99)
Potassium: 4 meq/L (ref 3.5–5.1)
Sodium: 141 meq/L (ref 135–145)

## 2023-12-19 ENCOUNTER — Ambulatory Visit: Payer: Self-pay | Admitting: Family Medicine

## 2023-12-19 NOTE — Progress Notes (Signed)
 Her sodium levels are back to normal.  We can recheck labs again at her next office visit here.

## 2023-12-31 ENCOUNTER — Encounter: Payer: Self-pay | Admitting: Radiology

## 2023-12-31 ENCOUNTER — Telehealth: Payer: Self-pay | Admitting: *Deleted

## 2023-12-31 NOTE — Telephone Encounter (Signed)
 Copied from CRM [id number]. Topic: Clinical - Lab/Test Results >> Dec 28, 2023  2:27 PM Dedra B wrote: Reason for CRM: Pt returning call for [facility] regarding urine test results.  Spoke with patient, Sx UTI  Appt with PCP schedule  Kathy Wares,RMA

## 2024-01-01 ENCOUNTER — Ambulatory Visit (INDEPENDENT_AMBULATORY_CARE_PROVIDER_SITE_OTHER): Admitting: Family Medicine

## 2024-01-01 ENCOUNTER — Encounter: Payer: Self-pay | Admitting: Family Medicine

## 2024-01-01 VITALS — BP 132/80 | HR 58 | Temp 97.2°F | Ht 62.0 in | Wt 145.0 lb

## 2024-01-01 DIAGNOSIS — N390 Urinary tract infection, site not specified: Secondary | ICD-10-CM

## 2024-01-01 DIAGNOSIS — E11311 Type 2 diabetes mellitus with unspecified diabetic retinopathy with macular edema: Secondary | ICD-10-CM | POA: Diagnosis not present

## 2024-01-01 DIAGNOSIS — E1159 Type 2 diabetes mellitus with other circulatory complications: Secondary | ICD-10-CM | POA: Diagnosis not present

## 2024-01-01 DIAGNOSIS — I152 Hypertension secondary to endocrine disorders: Secondary | ICD-10-CM | POA: Diagnosis not present

## 2024-01-01 LAB — POCT URINALYSIS DIPSTICK
Bilirubin, UA: NEGATIVE
Blood, UA: POSITIVE
Glucose, UA: NEGATIVE
Ketones, UA: NEGATIVE
Nitrite, UA: NEGATIVE
Protein, UA: POSITIVE — AB
Spec Grav, UA: 1.015 (ref 1.010–1.025)
Urobilinogen, UA: 0.2 U/dL
pH, UA: 6 (ref 5.0–8.0)

## 2024-01-01 LAB — URINALYSIS, ROUTINE W REFLEX MICROSCOPIC
Bilirubin Urine: NEGATIVE
Ketones, ur: NEGATIVE
Nitrite: NEGATIVE
Specific Gravity, Urine: 1.015 (ref 1.000–1.030)
Total Protein, Urine: 100 — AB
Urine Glucose: NEGATIVE
Urobilinogen, UA: 0.2 (ref 0.0–1.0)
pH: 6 (ref 5.0–8.0)

## 2024-01-01 MED ORDER — CIPROFLOXACIN HCL 500 MG PO TABS: 500.0000 mg | ORAL_TABLET | Freq: Two times a day (BID) | ORAL | 0 refills | Status: DC

## 2024-01-01 NOTE — Assessment & Plan Note (Signed)
 Advised her to follow-up with ophthalmology soon

## 2024-01-01 NOTE — Progress Notes (Signed)
   Kristy Trujillo is a 73 y.o. female who presents today for an office visit.  Assessment/Plan:  New/Acute Problems: Dysuria  UA and history consistent with UTI.  No signs of systemic illness had Pseudomonas UTI last month and patient does not think it completely resolved.  She previously did complete 3-day course of Cipro .  Based on last culture result we will send in a longer course of Cipro  for 7 days though we are rechecking urine culture today.  Encourage hydration.  We discussed reasons to return to care.  Chronic Problems Addressed Today: Hypertension associated with diabetes (HCC) Blood pressure at goal today on HCTZ 12.5 mg daily, amlodipine  5 mg daily, and Coreg 6.25 mg twice daily.  Her sodium levels normalized on most recent be met-do not need to make any adjustments to treatment plan today.  Diabetic macular edema of both eyes (HCC)   Advised her to follow-up with ophthalmology soon     Subjective:  HPI:  See assessment / plan for status of chronic conditions.   Discussed the use of AI scribe software for clinical note transcription with the patient, who gave verbal consent to proceed.  History of Present Illness Kristy Trujillo is a 73 year old female with recurrent urinary tract infections who presents with persistent symptoms.  She believes her urinary tract infection never fully resolved from the last episode. During her last visit at the beginning of the month, a urine sample was not taken. She has a history of Pseudomonas infection and was previously treated with ciprofloxacin , which initially improved her symptoms but then worsened again.  Currently, she experiences significant pain and urinary frequency. At times, the pain is so severe that she is unable to move. She also reports experiencing fevers and chills, stating 'I'm freezing out,' despite wearing a jacket. She previously took ciprofloxacin , which initially improved her symptoms but then worsened again.  In  addition to her UTI, she mentions her blood pressure management. She has been taking hydrochlorothiazide  by splitting the dose, taking half in the morning and half in the evening, which she finds effective. The pharmacy provided her with 12.5 mg tablets, which she takes twice daily.         Objective:  Physical Exam: BP 132/80   Pulse (!) 58   Temp (!) 97.2 F (36.2 C) (Temporal)   Ht 5' 2 (1.575 m)   Wt 145 lb (65.8 kg)   SpO2 98%   BMI 26.52 kg/m   Gen: No acute distress, resting comfortably CV: Regular rate and rhythm with no murmurs appreciated Pulm: Normal work of breathing, clear to auscultation bilaterally with no crackles, wheezes, or rhonchi Neuro: Grossly normal, moves all extremities Psych: Normal affect and thought content      Micah Barnier M. Kennyth, MD 01/01/2024 11:20 AM

## 2024-01-01 NOTE — Patient Instructions (Signed)
 It was very nice to see you today!  VISIT SUMMARY: During your visit, we discussed your ongoing urinary tract infection and your blood pressure management. You reported persistent symptoms of a UTI, including significant pain, urinary frequency, and fevers. We also reviewed your current blood pressure medication regimen.  YOUR PLAN: URINARY TRACT INFECTION: You have a recurrent urinary tract infection with a history of Pseudomonas infection. Your symptoms returned after initial improvement with ciprofloxacin . -We will prescribe a higher strength and longer course of ciprofloxacin  to treat the infection. -A urine culture will be ordered to identify the specific bacteria causing the infection. -We will contact you with the results of the urine culture.  HYPERTENSION: Your blood pressure is well-controlled with your current medication. -Continue taking hydrochlorothiazide  12.5 mg twice daily as you have been.  Return if symptoms worsen or fail to improve.   Take care, Dr Kennyth  PLEASE NOTE:  If you had any lab tests, please let us  know if you have not heard back within a few days. You may see your results on mychart before we have a chance to review them but we will give you a call once they are reviewed by us .   If we ordered any referrals today, please let us  know if you have not heard from their office within the next week.   If you had any urgent prescriptions sent in today, please check with the pharmacy within an hour of our visit to make sure the prescription was transmitted appropriately.   Please try these tips to maintain a healthy lifestyle:  Eat at least 3 REAL meals and 1-2 snacks per day.  Aim for no more than 5 hours between eating.  If you eat breakfast, please do so within one hour of getting up.   Each meal should contain half fruits/vegetables, one quarter protein, and one quarter carbs (no bigger than a computer mouse)  Cut down on sweet beverages. This includes  juice, soda, and sweet tea.   Drink at least 1 glass of water with each meal and aim for at least 8 glasses per day  Exercise at least 150 minutes every week.

## 2024-01-01 NOTE — Assessment & Plan Note (Signed)
 Blood pressure at goal today on HCTZ 12.5 mg daily, amlodipine  5 mg daily, and Coreg 6.25 mg twice daily.  Her sodium levels normalized on most recent be met-do not need to make any adjustments to treatment plan today.

## 2024-01-04 ENCOUNTER — Ambulatory Visit: Payer: Self-pay | Admitting: Family Medicine

## 2024-01-04 ENCOUNTER — Telehealth: Payer: Self-pay | Admitting: Family Medicine

## 2024-01-04 DIAGNOSIS — N390 Urinary tract infection, site not specified: Secondary | ICD-10-CM

## 2024-01-04 LAB — URINE CULTURE
MICRO NUMBER:: 17188486
MICRO NUMBER:: 17188488
SPECIMEN QUALITY:: ADEQUATE
SPECIMEN QUALITY:: ADEQUATE

## 2024-01-04 NOTE — Telephone Encounter (Signed)
 Patient called again requesting why medication for UTI has not been sent to [facility].

## 2024-01-04 NOTE — Progress Notes (Signed)
 Her urine culture was positive for Enterococcus.  The antibiotic we have her on may not treat this adequately.  Recommend we switch to Macrobid  100 mg twice daily for 7 days.  Please send a new prescription for patient.

## 2024-01-04 NOTE — Telephone Encounter (Signed)
 Patient requests to be called re: status of new RX. States new RX for UTI has not been received by [facility].

## 2024-01-05 ENCOUNTER — Other Ambulatory Visit: Payer: Self-pay | Admitting: Family Medicine

## 2024-01-05 MED ORDER — AMPICILLIN 500 MG PO CAPS: 500.0000 mg | ORAL_CAPSULE | Freq: Two times a day (BID) | ORAL | 0 refills | Status: DC

## 2024-01-05 NOTE — Progress Notes (Signed)
 [facility] call service call to inform that pt has contacted [facility] because still having urinary symptoms. States that she was supposed to get Rx for Macrobid  based on Ucx. She has stopped Cipro . Pt is asking for Macrobid  but hx of CKD IV, so contraindicated.  Lab Results  Component Value Date   NA 141 12/18/2023   CL 107 12/18/2023   K 4.0 12/18/2023   CO2 25 12/18/2023   BUN 49 (H) 12/18/2023   CREATININE 2.11 (H) 12/18/2023   GFR 22.67 (L) 12/18/2023   CALCIUM  8.9 12/18/2023   ALBUMIN 4.1 06/04/2023   GLUCOSE 135 (H) 12/18/2023      Component Ref Range & Units (hover) 4 d ago  MICRO NUMBER: [id number]  SPECIMEN QUALITY: Adequate  Sample Source URINE  STATUS: FINAL  ISOLATE 1: Enterococcus faecalis Abnormal   Comment: Greater than 100,000 CFU/mL of Enterococcus faecalis        Susceptibility   Enterococcus faecalis    URINE CULTURE POSITIVE 1    AMPICILLIN <=2 Sensitive    NITROFURANTOIN  <=16 Sensitive 1    VANCOMYCIN 2 Sensitive            According to caller, she instructed pt to go to UC but pt refused. She would like Abx to be sent to her pharmacy. Ampicillin 500 mg bid x 5 d sent.  Annalaya Wile, MD

## 2024-01-07 ENCOUNTER — Other Ambulatory Visit: Payer: Self-pay | Admitting: Family Medicine

## 2024-01-07 MED ORDER — NITROFURANTOIN MONOHYD MACRO 100 MG PO CAPS: 100.0000 mg | ORAL_CAPSULE | Freq: Two times a day (BID) | ORAL | 0 refills | Status: DC

## 2024-01-07 MED ORDER — AMOXICILLIN 500 MG PO CAPS: 500.0000 mg | ORAL_CAPSULE | Freq: Three times a day (TID) | ORAL | 0 refills | Status: DC

## 2024-01-07 NOTE — Telephone Encounter (Signed)
 Yes this replaces the ampicillin.

## 2024-01-07 NOTE — Telephone Encounter (Signed)
 Pt states RX not called in & now has lower back pain. On-call provider contacted.    Patient Name First: Kristy Last: Trujillo Gender: Female DOB: 1950/09/25 Age: 73 Y 5 M 7 D Return Phone Number: [phone] (Primary) Address: City/ State/ Zip: [place]  [address] Client [facility] [employer] Provider Kennyth Bart- MD Contact Type Call Who Is Calling Patient / Member / Family / Caregiver Call Type Triage / Clinical Relationship To Patient Self Return Phone Number [phone] (Primary) Chief Complaint Unclassified Symptom Reason for Call Symptomatic / Request for Health Information Initial Comment Caller states her medication still has not been called into the pharmacy. The pt is experiencing a UTI. Translation No Nurse Assessment Nurse: Cordella, RN, Scarlet Date/Time Titus Time): 01/05/2024 1:44:50 PM Confirm and document reason for call. If symptomatic, describe symptoms. ---Caller states saw Dr. Kennyth for UTI. States that the doctor changed the medication and the medication has not been sent to pharmacy. And now is having back discomfort across lower back. PSR 0/10 which increases pressure and pain with urination. Does the patient have any new or worsening symptoms? ---Yes Will a triage be completed? ---Yes Related visit to physician within the last 2 weeks? ---Yes Does the PT have any chronic conditions? (i.e. diabetes, asthma, this includes High risk factors for pregnancy, etc.) ---Yes List chronic conditions. ---HTN, Decreased kidney function, Is this a behavioral health or substance abuse call? ---No Guidelines Guideline Title Affirmed Question Affirmed Notes Nurse Date/Time (Eastern Time) Urinary Tract Infection on Antibiotic Follow-up Call - Female [1] Taking antibiotic > 72 hours (3 days) for UTI AND [2] painful urination or frequency is SAME Cordella,  RN, Scarlet 01/05/2024 1:56:12 PM  Guidelines Guideline Title Affirmed Question Affirmed Notes Nurse Date/Time (Eastern Time) (unchanged, not better) Disp. Time Titus Time) Disposition Final User 01/05/2024 2:03:57 PM See PCP within 24 Hours Yes Cordella PEAK, Scarlet 01/05/2024 2:11:51 PM Called On-Call Provider Cordella, RN, Scarlet Final Disposition 01/05/2024 2:03:57 PM See PCP within 24 Hours Yes Cordella, RN, Scarlet Caller Disagree/Comply Disagree Caller Understands Yes PreDisposition Did not know what to do Care Advice Given Per Guideline SEE PCP WITHIN 24 HOURS: * IF OFFICE WILL BE OPEN: You need to be examined within the next 24 hours. Call your doctor (or NP/PA) when the office opens and make an appointment. DRINK EXTRA FLUIDS: * Drink extra fluids. * Drink 8 to 10 cups (1,800 to 2,400 ml) of liquids a day. * Increased fluid intake may be a problem for adults with kidney failure or heart failure. DRINK EXTRA FLUIDS - EXTRA NOTES AND WARNINGS: * ACETAMINOPHEN  - REGULAR STRENGTH TYLENOL : Take 650 mg (two 325 mg pills) by mouth every 4 to 6 hours as needed. Each Regular Strength Tylenol  pill has 325 mg of acetaminophen . The most you should take is 10 pills a day (3,250 mg total). Note: In [place], the maximum is 12 pills a day (3,900 mg total). CALL BACK IF: * You become worse CARE ADVICE given per Urinary Tract Infection on Antibiotic Follow-up Call - Female (Adult) guideline. Comments User: Joline Cordella, RN Date/Time Titus Time): 01/05/2024 2:13:07 PM Contacted caller. Relayed information from OCP. Verbalized understanding of same. User: Joline Cordella, RN Date/Time Titus Time): 01/05/2024 2:13:27 PM OCP states will send RX to pharmacy listed Referrals REFERRED TO PCP OFFICE Paging DoctorName Phone DateTime Result/ Outcome Message Type Notes Jordan, Betty - MD [phone] 01/05/2024 2:11:51 PM Called  On Call Provider - Reached Doctor Paged Paging DoctorName  Phone DateTime Result/ Outcome Message Type Notes Jordan, Betty - MD 01/05/2024 2:11:59 PM Spoke with On Call - General Message Result

## 2024-01-07 NOTE — Telephone Encounter (Signed)
 Pt is upset. States a different RX was called in & was told she has kidney disease. States her provider never discussed this with her. She wants the original RX of Macrobid  called in ASAP.   Patient Name First: Kristy Last: Trujillo Gender: Female DOB: April 10, 1950 Age: 73 Y 5 M 7 D Return Phone Number: [phone] (Primary) Address: City/ State/ Zip: [place]  [address] Client [facility] Type Call Who Is Calling Patient / Member / Family / Caregiver Call Type Triage / Clinical Relationship To Patient Self Return Phone Number [phone] (Primary) Chief Complaint Prescription Refill or Medication Request (non symptomatic) Reason for Call Medication Question / Request Initial Comment Caller states someone called in a prescription for Macrobid  for her but someone told her she could not take it due to kidney disease. She said she has never been told she has kidney disease by [name], MD. States she needs medications but isn't sure what is safe to take. CBWN She is waiting to hear back. She has a UTI and she wants medicine for it. She wants the nurse to call in what Dr. [name] called in. Translation No Nurse Assessment Nurse: Veronia, RN, Darice Date/Time Titus Time): 01/05/2024 5:06:08 PM Confirm and document reason for call. If symptomatic, describe symptoms. ---Caller states she was told she had kidney disease and can't take Macrobid  earlier today. Ampicillin was called in today for her UTI. Caller states she was told that she has kidney disease and no one has ever told her that before. She said she is upset that this doctor would dx her with kidney disease and not her doctor. She said she shouldn't have done that. Caller states she had previously been on a rx for UTI and it didn't work and so her doctor was calling in Macrobid  and she doesn't understand why this  doctor today called in Ampicillin. She states she wants the med Dr. [name] prescribed. She wants Macrobid  called in to [facility] pharmacy. Does the patient have any new or worsening symptoms? ---No Please document clinical information provided and list any resource used. ---I instructed caller that the doctor called in Ampicillin and so I am sure that what she wants her to have but she insisted that she wants Macrobid  because that was prescribed by her doctor. I explained that I can only call the on call doctor and that I can't call Dr. Alise User: Darice Veronia, RN Date/Time Titus Time): 01/05/2024 5:28:04 PM Let caller know that the on call doctor, Dr. Jordan said her chart says she has kidney disease and her last last work shows CKD and so she can't prescribe Macrobid . She said she doesn't have to take the Ampicillin if she isn't comfortable with it. If she doesn't want to take it she can go to the ER or call Monday for Dr. [name] to prescribe it. [name] verbalized understanding. Paging DoctorName Phone DateTime Result/ Outcome Message Type Notes Jordan, Betty - MD [phone] 01/05/2024 5:19:48 PM Called On Call Provider - Reached Doctor Paged Jordan, Betty - MD 01/05/2024 5:25:58 PM Spoke with On Call - General Message Result Notified Dr. Jordan that patient is unhappy about being precribed Ampicillin because her doctor prescribed Macrobid . Dr. Jordan said her chart says she has kidney disease and her last last work shows CKD and so she can't prescribe Macorbid. She said she doesn't have to take it if she isn't comfortable  with it. If she doesn't want to take it she can go to the ER or call Monday for Dr. Kennyth to precribe it.

## 2024-01-07 NOTE — Telephone Encounter (Signed)
 Pharmacy comment: PATIENT JUST PICKED UP AMPICILLIN ON 11/8, IS THIS REPLACING THE AMPICILLIN?

## 2024-01-07 NOTE — Telephone Encounter (Signed)
 Pt called in this morning to check on RX. Per chart review, Dr. Jordan sent her Ampicillin 500 mg bid x 5 d sent.  Rx for Macrobid  100 mg sent per dr. Shaune verbal order.

## 2024-01-07 NOTE — Progress Notes (Signed)
 Prescription for amoxicillin 500 mg 3 times daily sent in. Patient instructed to not take Macrobid .

## 2024-01-08 NOTE — Telephone Encounter (Signed)
 This looks to be a duplicate thus please review and advise on how to proceed. Thank you.

## 2024-01-23 ENCOUNTER — Other Ambulatory Visit (HOSPITAL_COMMUNITY): Payer: Self-pay

## 2024-01-23 ENCOUNTER — Telehealth: Payer: Self-pay

## 2024-01-23 NOTE — Telephone Encounter (Signed)
 Patient called and expressed that she was still in need of the Oxybutynin . I informed patient that the prior authorization team attempted to completed the PA and were notified of an override. Patient requested that the office call the pharmacy to request the medication be filled.  [employer] and spoke to [place] and informed her of the note per the PA team. Whitney expressed that she would take care of it and notify patient when the medication was ready.  Called patient back and notified her of the conversation between myself and Whitney. Patient verbalized understanding and expressed that she will call Whitney in a few minutes call help desk and be assisted with override and fill medications. Patient was advised to call the office with any questions or concerns before next appointment.

## 2024-01-23 NOTE — Telephone Encounter (Signed)
 Pt called wanting an update re: previous msg.

## 2024-01-23 NOTE — Telephone Encounter (Signed)
 Copied from CRM #[id number]. Topic: Clinical - Medication Prior Auth >> Jan 22, 2024  5:00 PM Ashley R wrote: Reason for CRM: [employer] cover oxybutynin  (DITROPAN  XL) 10 MG 24 hr tablet without prior authorization.  Pt states she is completely out.  Please see patient message regarding Rx and PA needed. Pt prescription sent in April with refills and not showing a PA was needed at that time. Please advise on PA for patient.

## 2024-01-23 NOTE — Telephone Encounter (Signed)
 Copied from CRM [id number]. Topic: Clinical - Medication Question >> Jan 23, 2024  2:21 PM Sasha M wrote: Reason for CRM: Pt calling in because she is saying the pharmacy needs a code for her medication refill. I advised her of the most updated msg from Lucretia and called cal to verify. Santana was with a pt so I informed Ms Gagan to call the pharmacy and see what they say and Santana will call her back as soon as she's finished with her pt  Returned pt call and advised spoke with pharmacy, they are aware of override code. Pt is on the way to the pharmacy now to pick up prescription. Nothing further is needed.

## 2024-01-23 NOTE — Telephone Encounter (Signed)
 Pharmacy Patient Advocate Encounter   Received notification from Pt Calls Messages that prior authorization for Oxybutynin  ER 10MG  Tabs is required/requested.   Insurance verification completed.   The patient is insured through [employer].   Per test insurance rep: No PA needed. Pharmacy has to call helpdesk at [phone]. The pharmacy has to request for a one time overide called [id number].

## 2024-01-23 NOTE — Telephone Encounter (Signed)
 Called pharmacy and advised and pt is aware and on the way to pharmacy now. Nothing further is needed

## 2024-02-14 ENCOUNTER — Telehealth: Payer: Self-pay | Admitting: Family Medicine

## 2024-02-14 NOTE — Telephone Encounter (Signed)
 Prescription Request  02/14/2024  LOV: 01/07/2024  What is the name of the medication or equipment?   clonazePAM  (KLONOPIN ) 1 MG tablet  Patient states she is out of the above medication and requests 90 day supply  Have you contacted your pharmacy to request a refill? Yes   Which pharmacy would you like this sent to?  [facility] [address], [place] - [address] [place] [address] Phone: [phone] Fax: [phone]    Patient notified that their request is being sent to the clinical staff for review and that they should receive a response within 2 business days.   Please advise at [facility] [phone]

## 2024-02-15 ENCOUNTER — Other Ambulatory Visit: Payer: Self-pay | Admitting: Family Medicine

## 2024-02-15 NOTE — Telephone Encounter (Signed)
 Rx send on 02/15/2024

## 2024-02-15 NOTE — Telephone Encounter (Signed)
 Patient requests to be called re: status of RX request

## 2024-02-25 ENCOUNTER — Telehealth: Payer: Self-pay | Admitting: *Deleted

## 2024-02-25 NOTE — Telephone Encounter (Signed)
 Copied from CRM #[id number]. Topic: General - Other >> Feb 25, 2024  9:33 AM Kristy Trujillo wrote: Reason for CRM: [facility] [employer]  recommendation to switch medication will be refaxing over form  Form placed to be reviewed in PCP office  [facility]

## 2024-02-29 ENCOUNTER — Other Ambulatory Visit: Payer: Self-pay | Admitting: Family Medicine

## 2024-03-07 ENCOUNTER — Encounter: Payer: No Typology Code available for payment source | Admitting: Family Medicine

## 2024-03-10 ENCOUNTER — Telehealth: Payer: Self-pay | Admitting: Family Medicine

## 2024-03-10 ENCOUNTER — Telehealth: Payer: Self-pay | Admitting: *Deleted

## 2024-03-10 NOTE — Telephone Encounter (Signed)
 Patient requests RX for hydrochlorothiazide  (HYDRODIURIL ) 12.5 MG tablet  [facility] [address] [place] - [address] Phone: [phone]  Fax: [phone]    Patient states a RX was sent to Pharm for 25MG  tablet (which is incorrect). Needs 12.5 MG.and will be returning the 25MG  to the Pharmacy.

## 2024-03-10 NOTE — Telephone Encounter (Signed)
 Copied from CRM [id number]. Topic: Clinical - Medication Question >> Mar 10, 2024 12:45 PM Winona R wrote: Pt received a med refill for hydrochlorothiazide  (HYDRODIURIL ) 25 MG tablet [[id number]] but she states she was previously taking hydrochlorothiazide  (HYDRODIURIL ) 12.5 MG tablet [[id number]] Pt believes this is an error and is requesting to speak with a nurse.   Please advise  Alysia Scism,RMA

## 2024-03-11 ENCOUNTER — Ambulatory Visit: Payer: No Typology Code available for payment source | Admitting: Family Medicine

## 2024-03-11 VITALS — BP 150/80 | HR 73 | Temp 98.5°F | Ht 62.0 in | Wt 144.4 lb

## 2024-03-11 DIAGNOSIS — E611 Iron deficiency: Secondary | ICD-10-CM | POA: Diagnosis not present

## 2024-03-11 DIAGNOSIS — N184 Chronic kidney disease, stage 4 (severe): Secondary | ICD-10-CM

## 2024-03-11 DIAGNOSIS — E2839 Other primary ovarian failure: Secondary | ICD-10-CM | POA: Diagnosis not present

## 2024-03-11 DIAGNOSIS — E1159 Type 2 diabetes mellitus with other circulatory complications: Secondary | ICD-10-CM

## 2024-03-11 DIAGNOSIS — E1169 Type 2 diabetes mellitus with other specified complication: Secondary | ICD-10-CM | POA: Diagnosis not present

## 2024-03-11 DIAGNOSIS — Z0001 Encounter for general adult medical examination with abnormal findings: Secondary | ICD-10-CM | POA: Diagnosis not present

## 2024-03-11 DIAGNOSIS — F339 Major depressive disorder, recurrent, unspecified: Secondary | ICD-10-CM | POA: Diagnosis not present

## 2024-03-11 DIAGNOSIS — E119 Type 2 diabetes mellitus without complications: Secondary | ICD-10-CM

## 2024-03-11 DIAGNOSIS — F419 Anxiety disorder, unspecified: Secondary | ICD-10-CM | POA: Diagnosis not present

## 2024-03-11 DIAGNOSIS — N3281 Overactive bladder: Secondary | ICD-10-CM | POA: Diagnosis not present

## 2024-03-11 DIAGNOSIS — E785 Hyperlipidemia, unspecified: Secondary | ICD-10-CM

## 2024-03-11 DIAGNOSIS — E11311 Type 2 diabetes mellitus with unspecified diabetic retinopathy with macular edema: Secondary | ICD-10-CM

## 2024-03-11 DIAGNOSIS — I152 Hypertension secondary to endocrine disorders: Secondary | ICD-10-CM

## 2024-03-11 MED ORDER — CARVEDILOL 6.25 MG PO TABS: 6.2500 mg | ORAL_TABLET | Freq: Two times a day (BID) | ORAL | 3 refills | Status: AC

## 2024-03-11 MED ORDER — OXYBUTYNIN CHLORIDE ER 10 MG PO TB24: 10.0000 mg | ORAL_TABLET | Freq: Every day | ORAL | 3 refills | Status: AC

## 2024-03-11 MED ORDER — GABAPENTIN 300 MG PO CAPS: 300.0000 mg | ORAL_CAPSULE | Freq: Three times a day (TID) | ORAL | 3 refills | Status: AC

## 2024-03-11 MED ORDER — AMLODIPINE BESYLATE 5 MG PO TABS: 5.0000 mg | ORAL_TABLET | Freq: Every day | ORAL | 3 refills | Status: AC

## 2024-03-11 MED ORDER — ALCLOMETASONE DIPROPIONATE 0.05 % EX CREA: TOPICAL_CREAM | Freq: Two times a day (BID) | CUTANEOUS | 0 refills | Status: AC

## 2024-03-11 MED ORDER — HYDROXYZINE HCL 25 MG PO TABS: 25.0000 mg | ORAL_TABLET | Freq: Three times a day (TID) | ORAL | 3 refills | Status: AC

## 2024-03-11 MED ORDER — ATORVASTATIN CALCIUM 40 MG PO TABS: 40.0000 mg | ORAL_TABLET | Freq: Every day | ORAL | 3 refills | Status: AC

## 2024-03-11 MED ORDER — HYDROCHLOROTHIAZIDE 12.5 MG PO TABS: 12.5000 mg | ORAL_TABLET | Freq: Two times a day (BID) | ORAL | 3 refills | Status: AC

## 2024-03-11 MED ORDER — FLUOXETINE HCL 40 MG PO CAPS: 40.0000 mg | ORAL_CAPSULE | Freq: Every day | ORAL | 3 refills | Status: AC

## 2024-03-11 NOTE — Telephone Encounter (Signed)
Please advise on medication dose.

## 2024-03-11 NOTE — Assessment & Plan Note (Signed)
 On Prozac  40 mg daily and clonazepam  1 mg daily as needed.  Doing well with this combination.  Will refill today.  Tolerating well without side effects.  Follow-up again in 3 to 6 months.

## 2024-03-11 NOTE — Assessment & Plan Note (Signed)
 On oxybutynin  10 mg daily.  Symptoms are stable.

## 2024-03-11 NOTE — Assessment & Plan Note (Signed)
 On Lipitor 40 mg daily.  Tolerating well.  Will place future order for labs-he declined checking today.

## 2024-03-11 NOTE — Telephone Encounter (Signed)
 Patient has an OV today with PCP.

## 2024-03-11 NOTE — Telephone Encounter (Signed)
 Please clarify with patient what dose she was on previously.

## 2024-03-11 NOTE — Assessment & Plan Note (Signed)
 Continue management per ophthalmology.  We are addressing glycemic control as above.

## 2024-03-11 NOTE — Assessment & Plan Note (Signed)
 Follows with nephrology.  Will place order for CMET on labs today though she declined checking during today's visit.  She will come back to get this done soon.

## 2024-03-11 NOTE — Progress Notes (Signed)
 "  Chief Complaint:  Kristy Trujillo is a 74 y.o. female who presents today for her annual comprehensive physical exam.    Assessment/Plan:   Chronic Problems Addressed Today: Diabetes mellitus without complication (HCC) Check A1c with labs.  We discussed lifestyle interventions.  Declined checking labs today.  Dyslipidemia associated with type 2 diabetes mellitus (HCC) On Lipitor 40 mg daily.  Tolerating well.  Will place future order for labs-he declined checking today.  CKD (chronic kidney disease) stage 4, GFR 15-29 ml/min (HCC) Follows with nephrology.  Will place order for CMET on labs today though she declined checking during today's visit.  She will come back to get this done soon.  Anxiety On Prozac  40 mg daily and clonazepam  1 mg daily as needed.  Doing well with this combination.  Will refill today.  Tolerating well without side effects.  Follow-up again in 3 to 6 months.  Depression, recurrent Stable on Prozac  40 mg daily.  OAB (overactive bladder) On oxybutynin  10 mg daily.  Symptoms are stable.  Diabetic macular edema of both eyes (HCC) Continue management per ophthalmology.  We are addressing glycemic control as above.  Hypertension associated with diabetes (HCC) Mildly elevated today though was at goal at her visit here couple months ago.  Will continue her current regimen HCTZ 12.5 mg twice daily, amlodipine  5 mg daily, and Coreg  6.25 mg twice daily.  She can monitor at home and let us  know if persistently elevated.  We are placing future labs today as well.  Preventative Healthcare: Check labs.  Declined vaccines.  Declined colon cancer screening.  Will order bone density scan.  Patient Counseling(The following topics were reviewed and/or handout was given):  -Nutrition: Stressed importance of moderation in sodium/caffeine intake, saturated fat and cholesterol, caloric balance, sufficient intake of fresh fruits, vegetables, and fiber.  -Stressed the importance of  regular exercise.   -Substance Abuse: Discussed cessation/primary prevention of tobacco, alcohol, or other drug use; driving or other dangerous activities under the influence; availability of treatment for abuse.   -Injury prevention: Discussed safety belts, safety helmets, smoke detector, smoking near bedding or upholstery.   -Sexuality: Discussed sexually transmitted diseases, partner selection, use of condoms, avoidance of unintended pregnancy and contraceptive alternatives.   -Dental health: Discussed importance of regular tooth brushing, flossing, and dental visits.  -Health maintenance and immunizations reviewed. Please refer to Health maintenance section.  Return to care in 1 year for next preventative visit.     Subjective:  HPI:  She has no acute complaints today. Patient is here today for her annual physical.  See assessment / plan for status of chronic conditions.  Discussed the use of AI scribe software for clinical note transcription with the patient, who gave verbal consent to proceed.  History of Present Illness Kristy Trujillo is a 74 year old female who presents for an annual physical exam.  Her blood pressure medication regimen was adjusted to taking 12.5 mg twice a day instead of splitting a 25 mg dose. However, she received 25 mg tablets from the pharmacy, which are difficult to cut, and requests a prescription for 12.5 mg tablets. She does not regularly check her blood pressure at home due to the cost of a blood pressure cuff. Her best friend's mother was recently placed in hospice care, which has been emotionally challenging for her. This situation has been emotionally challenging, recalling similar experiences with her own parents.  She is currently taking hydrochlorothiazide , Prozac , amlodipine , and atorvastatin . She requests  refills for these medications. She declines blood work and vaccinations during this visit, citing previous adverse reactions to vaccines.  She  tries to watch her diet and stay active, although she admits to overeating during the holidays.        03/11/2024    2:23 PM  Depression screen PHQ 2/9  Decreased Interest 0  Down, Depressed, Hopeless 0  PHQ - 2 Score 0    Health Maintenance Due  Topic Date Due   FOOT EXAM  Never done   Fecal DNA (Cologuard)  Never done   Bone Density Scan  Never done   Mammogram  10/15/2021   OPHTHALMOLOGY EXAM  05/17/2023   Medicare Annual Wellness (AWV)  10/16/2023   HEMOGLOBIN A1C  12/06/2023     ROS: Per HPI, otherwise a complete review of systems was negative.   PMH:  The following were reviewed and entered/updated in epic: Past Medical History:  Diagnosis Date   Gallstones    Shingles    Patient Active Problem List   Diagnosis Date Noted   Diabetic macular edema of both eyes (HCC) 01/01/2024   Recurrent UTI 09/11/2023   Normocytic anemia 06/19/2023   Cataract 03/06/2023   Unsteady gait 09/22/2021   Dyslipidemia associated with type 2 diabetes mellitus (HCC) 07/01/2021   CKD (chronic kidney disease) stage 4, GFR 15-29 ml/min (HCC) 02/10/2021   Diabetes mellitus without complication (HCC) 02/10/2021   Hypertension associated with diabetes (HCC) 12/09/2020   Tinnitus of both ears 12/09/2020   Leg edema 11/23/2020   OAB (overactive bladder) 08/12/2020   Lipoma 03/11/2018   Depression, recurrent 03/11/2018   Anxiety 03/11/2018   Past Surgical History:  Procedure Laterality Date   CHOLECYSTECTOMY      Family History  Problem Relation Age of Onset   Diabetes Neg Hx    High Cholesterol Neg Hx    Hypertension Neg Hx    Colon cancer Neg Hx    Breast cancer Neg Hx     Medications- reviewed and updated Current Outpatient Medications  Medication Sig Dispense Refill   clonazePAM  (KLONOPIN ) 1 MG tablet Take 1 tablet by mouth twice daily as needed for anxiety 180 tablet 0   Multiple Vitamins-Minerals (MULTIVITAMINS THER. W/MINERALS) TABS Take 1 tablet by mouth daily.      OVER THE COUNTER MEDICATION Take 2 tablets by mouth every morning. OTC allergy and sinus tablets     sodium bicarbonate 650 MG tablet Take 650 mg by mouth 2 (two) times daily.     triamcinolone  cream (KENALOG ) 0.5 % Apply 1 Application topically 3 (three) times daily. 30 g 0   trimethoprim  (TRIMPEX ) 100 MG tablet Take 1 tablet (100 mg total) by mouth daily. 90 tablet 0   alclomethasone (ACLOVATE ) 0.05 % cream Apply topically 2 (two) times daily. 30 g 0   amLODipine  (NORVASC ) 5 MG tablet Take 1 tablet (5 mg total) by mouth daily. 90 tablet 3   atorvastatin  (LIPITOR) 40 MG tablet Take 1 tablet (40 mg total) by mouth daily. 90 tablet 3   carvedilol  (COREG ) 6.25 MG tablet Take 1 tablet (6.25 mg total) by mouth 2 (two) times daily. 180 tablet 3   FLUoxetine  (PROZAC ) 40 MG capsule Take 1 capsule (40 mg total) by mouth daily. 90 capsule 3   gabapentin  (NEURONTIN ) 300 MG capsule Take 1 capsule (300 mg total) by mouth 3 (three) times daily. 270 capsule 3   hydrochlorothiazide  (HYDRODIURIL ) 12.5 MG tablet Take 1 tablet (12.5 mg total) by mouth 2 (  two) times daily. 180 tablet 3   hydrOXYzine  (ATARAX ) 25 MG tablet Take 1 tablet (25 mg total) by mouth 3 (three) times daily. 270 tablet 3   oxybutynin  (DITROPAN  XL) 10 MG 24 hr tablet Take 1 tablet (10 mg total) by mouth at bedtime. 90 tablet 3   No current facility-administered medications for this visit.    Allergies-reviewed and updated Allergies[1]  Social History   Socioeconomic History   Marital status: Divorced    Spouse name: Not on file   Number of children: 2   Years of education: Not on file   Highest education level: Associate degree: academic program  Occupational History   Occupation: Retired     Comment: Holiday Representative   Tobacco Use   Smoking status: Never   Smokeless tobacco: Never  Substance and Sexual Activity   Alcohol use: No   Drug use: No   Sexual activity: Not on file  Other Topics Concern   Not on file  Social History  Narrative   Left Handed    Lives in a one story home   Social Drivers of Health   Tobacco Use: Low Risk (01/01/2024)   Patient History    Smoking Tobacco Use: Never    Smokeless Tobacco Use: Never    Passive Exposure: Not on file  Financial Resource Strain: Low Risk (06/05/2023)   Overall Financial Resource Strain (CARDIA)    Difficulty of Paying Living Expenses: Not very hard  Food Insecurity: No Food Insecurity (06/05/2023)   Hunger Vital Sign    Worried About Running Out of Food in the Last Year: Never true    Ran Out of Food in the Last Year: Never true  Transportation Needs: No Transportation Needs (06/05/2023)   PRAPARE - Administrator, Civil Service (Medical): No    Lack of Transportation (Non-Medical): No  Physical Activity: Insufficiently Active (06/05/2023)   Exercise Vital Sign    Days of Exercise per Week: 1 day    Minutes of Exercise per Session: 20 min  Stress: Stress Concern Present (06/05/2023)   [employer] of Occupational Health - Occupational Stress Questionnaire    Feeling of Stress : Rather much  Social Connections: Moderately Integrated (06/05/2023)   Social Connection and Isolation Panel    Frequency of Communication with Friends and Family: More than three times a week    Frequency of Social Gatherings with Friends and Family: Three times a week    Attends Religious Services: More than 4 times per year    Active Member of Clubs or Organizations: Yes    Attends Banker Meetings: More than 4 times per year    Marital Status: Divorced  Depression (PHQ2-9): Low Risk (03/11/2024)   Depression (PHQ2-9)    PHQ-2 Score: 0  Alcohol Screen: Not on file  Housing: Unknown (06/05/2023)   Housing Stability Vital Sign    Unable to Pay for Housing in the Last Year: No    Number of Times Moved in the Last Year: Not on file    Homeless in the Last Year: No  Utilities: Not At Risk (10/16/2022)   AHC Utilities    Threatened with loss of utilities: No   Health Literacy: Adequate Health Literacy (10/16/2022)   B1300 Health Literacy    Frequency of need for help with medical instructions: Never        Objective:  Physical Exam: BP (!) 150/80   Pulse 73   Temp 98.5 F (36.9 C) (Temporal)  Ht 5' 2 (1.575 m)   Wt 144 lb 6.4 oz (65.5 kg)   SpO2 98%   BMI 26.41 kg/m   Body mass index is 26.41 kg/m. Wt Readings from Last 3 Encounters:  03/11/24 144 lb 6.4 oz (65.5 kg)  01/01/24 145 lb (65.8 kg)  12/04/23 143 lb 6.4 oz (65 kg)   Gen: NAD, resting comfortably HEENT: TMs normal bilaterally. OP clear. No thyromegaly noted.  CV: RRR with no murmurs appreciated Pulm: NWOB, CTAB with no crackles, wheezes, or rhonchi GI: Normal bowel sounds present. Soft, Nontender, Nondistended. MSK: no edema, cyanosis, or clubbing noted Skin: warm, dry Neuro: CN2-12 grossly intact. Strength 5/5 in upper and lower extremities. Reflexes symmetric and intact bilaterally.  Psych: Normal affect and thought content     Winnona Wargo M. Kennyth, MD 03/11/2024 2:55 PM     [1]  Allergies Allergen Reactions   Alcohol Detox [Nutritional Supplements]     Flushing (cough medicine)   Codeine Swelling   Erythromycin Swelling   "

## 2024-03-11 NOTE — Assessment & Plan Note (Signed)
Stable on Prozac 40 mg daily. 

## 2024-03-11 NOTE — Assessment & Plan Note (Signed)
 Check A1c with labs.  We discussed lifestyle interventions.  Declined checking labs today.

## 2024-03-11 NOTE — Assessment & Plan Note (Signed)
 Mildly elevated today though was at goal at her visit here couple months ago.  Will continue her current regimen HCTZ 12.5 mg twice daily, amlodipine  5 mg daily, and Coreg  6.25 mg twice daily.  She can monitor at home and let us  know if persistently elevated.  We are placing future labs today as well.

## 2024-03-11 NOTE — Telephone Encounter (Signed)
 Please see previous message

## 2024-03-11 NOTE — Patient Instructions (Signed)
 It was very nice to see you today!  VISIT SUMMARY: During your annual physical exam, we discussed your blood pressure medication, emotional challenges, and medication refills. You declined blood work and vaccinations at this time.  YOUR PLAN: HYPERTENSION ASSOCIATED WITH TYPE 2 DIABETES MELLITUS: Your blood pressure is slightly elevated, and you had difficulty splitting your 25 mg tablets. -I have prescribed hydrochlorothiazide  12.5 mg tablets for you to take twice daily. -Your blood pressure was rechecked before you left the clinic.  DYSLIPIDEMIA ASSOCIATED WITH TYPE 2 DIABETES MELLITUS: You have high cholesterol levels related to type 2 diabetes. -I have refilled your atorvastatin  prescription.  MAJOR DEPRESSIVE DISORDER, RECURRENT: You are experiencing recurrent major depressive disorder. -I have refilled your Prozac  prescription.  GENERAL HEALTH MAINTENANCE: You declined blood work, vaccines, bone density scan, and colonoscopy at this time. -I have ordered blood work for you to complete in the future. -We discussed the potential for a bone density scan and colonoscopy in the future.  Return in about 6 months (around 09/08/2024) for Follow Up.   Take care, Dr Kennyth  PLEASE NOTE:  If you had any lab tests, please let us  know if you have not heard back within a few days. You may see your results on mychart before we have a chance to review them but we will give you a call once they are reviewed by us .   If we ordered any referrals today, please let us  know if you have not heard from their office within the next week.   If you had any urgent prescriptions sent in today, please check with the pharmacy within an hour of our visit to make sure the prescription was transmitted appropriately.   Please try these tips to maintain a healthy lifestyle:  Eat at least 3 REAL meals and 1-2 snacks per day.  Aim for no more than 5 hours between eating.  If you eat breakfast, please do so within  one hour of getting up.   Each meal should contain half fruits/vegetables, one quarter protein, and one quarter carbs (no bigger than a computer mouse)  Cut down on sweet beverages. This includes juice, soda, and sweet tea.   Drink at least 1 glass of water with each meal and aim for at least 8 glasses per day  Exercise at least 150 minutes every week.    Preventive Care 62 Years and Older, Female Preventive care refers to lifestyle choices and visits with your health care provider that can promote health and wellness. Preventive care visits are also called wellness exams. What can I expect for my preventive care visit? Counseling Your health care provider may ask you questions about your: Medical history, including: Past medical problems. Family medical history. Pregnancy and menstrual history. History of falls. Current health, including: Memory and ability to understand (cognition). Emotional well-being. Home life and relationship well-being. Sexual activity and sexual health. Lifestyle, including: Alcohol, nicotine or tobacco, and drug use. Access to firearms. Diet, exercise, and sleep habits. Work and work astronomer. Sunscreen use. Safety issues such as seatbelt and bike helmet use. Physical exam Your health care provider will check your: Height and weight. These may be used to calculate your BMI (body mass index). BMI is a measurement that tells if you are at a healthy weight. Waist circumference. This measures the distance around your waistline. This measurement also tells if you are at a healthy weight and may help predict your risk of certain diseases, such as type 2 diabetes and  high blood pressure. Heart rate and blood pressure. Body temperature. Skin for abnormal spots. What immunizations do I need?  Vaccines are usually given at various ages, according to a schedule. Your health care provider will recommend vaccines for you based on your age, medical history,  and lifestyle or other factors, such as travel or where you work. What tests do I need? Screening Your health care provider may recommend screening tests for certain conditions. This may include: Lipid and cholesterol levels. Hepatitis C test. Hepatitis B test. HIV (human immunodeficiency virus) test. STI (sexually transmitted infection) testing, if you are at risk. Lung cancer screening. Colorectal cancer screening. Diabetes screening. This is done by checking your blood sugar (glucose) after you have not eaten for a while (fasting). Mammogram. Talk with your health care provider about how often you should have regular mammograms. BRCA-related cancer screening. This may be done if you have a family history of breast, ovarian, tubal, or peritoneal cancers. Bone density scan. This is done to screen for osteoporosis. Talk with your health care provider about your test results, treatment options, and if necessary, the need for more tests. Follow these instructions at home: Eating and drinking  Eat a diet that includes fresh fruits and vegetables, whole grains, lean protein, and low-fat dairy products. Limit your intake of foods with high amounts of sugar, saturated fats, and salt. Take vitamin and mineral supplements as recommended by your health care provider. Do not drink alcohol if your health care provider tells you not to drink. If you drink alcohol: Limit how much you have to 0-1 drink a day. Know how much alcohol is in your drink. In the [place] one drink equals one 12 oz bottle of beer (355 mL), one 5 oz glass of wine (148 mL), or one 1 oz glass of hard liquor (44 mL). Lifestyle Brush your teeth every morning and night with fluoride toothpaste. Floss one time each day. Exercise for at least 30 minutes 5 or more days each week. Do not use any products that contain nicotine or tobacco. These products include cigarettes, chewing tobacco, and vaping devices, such as e-cigarettes. If you  need help quitting, ask your health care provider. Do not use drugs. If you are sexually active, practice safe sex. Use a condom or other form of protection in order to prevent STIs. Take aspirin only as told by your health care provider. Make sure that you understand how much to take and what form to take. Work with your health care provider to find out whether it is safe and beneficial for you to take aspirin daily. Ask your health care provider if you need to take a cholesterol-lowering medicine (statin). Find healthy ways to manage stress, such as: Meditation, yoga, or listening to music. Journaling. Talking to a trusted person. Spending time with friends and family. Minimize exposure to UV radiation to reduce your risk of skin cancer. Safety Always wear your seat belt while driving or riding in a vehicle. Do not drive: If you have been drinking alcohol. Do not ride with someone who has been drinking. When you are tired or distracted. While texting. If you have been using any mind-altering substances or drugs. Wear a helmet and other protective equipment during sports activities. If you have firearms in your house, make sure you follow all gun safety procedures. What's next? Visit your health care provider once a year for an annual wellness visit. Ask your health care provider how often you should have your eyes and  teeth checked. Stay up to date on all vaccines. This information is not intended to replace advice given to you by your health care provider. Make sure you discuss any questions you have with your health care provider. Document Revised: 08/11/2020 Document Reviewed: 08/11/2020 Elsevier Patient Education  2024 [employer].

## 2024-03-26 NOTE — Progress Notes (Addendum)
 Kristy Trujillo                                          MRN: [id number]   03/26/2024   The [facility] Quality Team Specialist reviewed this patient medical record for the purposes of chart review for care gap closure. The following were reviewed: chart review for care gap closure-breast cancer screening and diabetic eye exam.    [facility] Quality Team

## 2024-09-09 ENCOUNTER — Ambulatory Visit: Admitting: Family Medicine
